# Patient Record
Sex: Female | Born: 1950 | Race: White | Hispanic: No | Marital: Married | Smoking: Never smoker
Health system: Southern US, Community
[De-identification: ages and names within clinical notes are randomized; demographics above are authoritative.]

## PROBLEM LIST (undated history)

## (undated) DIAGNOSIS — I639 Cerebral infarction, unspecified: Secondary | ICD-10-CM

## (undated) DIAGNOSIS — T7840XA Allergy, unspecified, initial encounter: Secondary | ICD-10-CM

## (undated) DIAGNOSIS — G5600 Carpal tunnel syndrome, unspecified upper limb: Secondary | ICD-10-CM

## (undated) DIAGNOSIS — E785 Hyperlipidemia, unspecified: Secondary | ICD-10-CM

## (undated) DIAGNOSIS — I499 Cardiac arrhythmia, unspecified: Secondary | ICD-10-CM

## (undated) DIAGNOSIS — M1612 Unilateral primary osteoarthritis, left hip: Secondary | ICD-10-CM

## (undated) DIAGNOSIS — F32A Depression, unspecified: Secondary | ICD-10-CM

## (undated) DIAGNOSIS — G4730 Sleep apnea, unspecified: Secondary | ICD-10-CM

## (undated) HISTORY — DX: Cardiac arrhythmia, unspecified: I49.9

## (undated) HISTORY — PX: CHOLECYSTECTOMY: SHX55

## (undated) HISTORY — DX: Carpal tunnel syndrome, unspecified upper limb: G56.00

## (undated) HISTORY — DX: Unilateral primary osteoarthritis, left hip: M16.12

## (undated) HISTORY — PX: EYELID LACERATION REPAIR: SHX1564

## (undated) HISTORY — PX: BUNIONECTOMY: SHX129

## (undated) HISTORY — DX: Hyperlipidemia, unspecified: E78.5

## (undated) HISTORY — DX: Allergy, unspecified, initial encounter: T78.40XA

---

## 2009-04-23 ENCOUNTER — Emergency Department: Payer: Self-pay | Admitting: Internal Medicine

## 2011-05-22 LAB — HM PAP SMEAR

## 2012-08-20 ENCOUNTER — Ambulatory Visit: Payer: Self-pay

## 2012-11-03 ENCOUNTER — Ambulatory Visit: Payer: Self-pay | Admitting: Gastroenterology

## 2012-11-03 LAB — HM COLONOSCOPY

## 2012-11-04 LAB — PATHOLOGY REPORT

## 2014-03-03 ENCOUNTER — Ambulatory Visit: Payer: Self-pay

## 2014-03-03 LAB — HM MAMMOGRAPHY

## 2014-08-30 ENCOUNTER — Ambulatory Visit: Payer: Self-pay

## 2014-08-30 LAB — HM DEXA SCAN

## 2015-03-16 ENCOUNTER — Telehealth: Payer: Self-pay | Admitting: Unknown Physician Specialty

## 2015-03-16 MED ORDER — FENOFIBRATE 145 MG PO TABS: 145.0000 mg | ORAL_TABLET | Freq: Every day | ORAL | Status: DC

## 2015-03-16 NOTE — Telephone Encounter (Signed)
Routing to provider. Practice partner number is [phone].

## 2015-03-16 NOTE — Telephone Encounter (Signed)
E-Fax came through for refill: Rx: Fenofibric Rx copy in basket

## 2015-07-15 DIAGNOSIS — J309 Allergic rhinitis, unspecified: Secondary | ICD-10-CM | POA: Insufficient documentation

## 2015-07-15 DIAGNOSIS — G5600 Carpal tunnel syndrome, unspecified upper limb: Secondary | ICD-10-CM

## 2015-07-15 DIAGNOSIS — E785 Hyperlipidemia, unspecified: Secondary | ICD-10-CM | POA: Insufficient documentation

## 2015-07-22 ENCOUNTER — Ambulatory Visit (INDEPENDENT_AMBULATORY_CARE_PROVIDER_SITE_OTHER): Payer: Medicare Other | Admitting: Unknown Physician Specialty

## 2015-07-22 ENCOUNTER — Encounter: Payer: Self-pay | Admitting: Unknown Physician Specialty

## 2015-07-22 VITALS — BP 125/81 | HR 61 | Temp 97.4°F | Ht 61.1 in | Wt 181.4 lb

## 2015-07-22 DIAGNOSIS — E785 Hyperlipidemia, unspecified: Secondary | ICD-10-CM | POA: Diagnosis not present

## 2015-07-22 DIAGNOSIS — Z5181 Encounter for therapeutic drug level monitoring: Secondary | ICD-10-CM | POA: Diagnosis not present

## 2015-07-22 DIAGNOSIS — Z0000 Encounter for general adult medical examination without abnormal findings: Secondary | ICD-10-CM | POA: Diagnosis not present

## 2015-07-22 DIAGNOSIS — M25561 Pain in right knee: Secondary | ICD-10-CM | POA: Diagnosis not present

## 2015-07-22 DIAGNOSIS — G5600 Carpal tunnel syndrome, unspecified upper limb: Secondary | ICD-10-CM

## 2015-07-22 DIAGNOSIS — L309 Dermatitis, unspecified: Secondary | ICD-10-CM | POA: Diagnosis not present

## 2015-07-22 NOTE — Assessment & Plan Note (Signed)
Check lipid panel  

## 2015-07-22 NOTE — Progress Notes (Signed)
Patient: Bethany Lara, Female    DOB: 12/11/1950, 64 y.o.   MRN: [id number] Visit Date: 07/22/2015  Today's Provider: Kathrine Haddock, NP   Chief Complaint  Patient presents with  . Medicare Wellness    pt states she is having a problem with her skin   Subjective:    Annual wellness visit Bethany Lara is a 64 y.o. female who presents today for her Subsequent Annual Wellness Visit. She feels well. She reports exercising . She reports she is sleeping poorly due to carpal tunnel  ----------------------------------------------------------- Hyperlipidemia This is a chronic problem. The problem is controlled. Recent lipid tests were reviewed and are normal. Pertinent negatives include no chest pain, focal sensory loss, focal weakness, leg pain, myalgias or shortness of breath. Current antihyperlipidemic treatment includes statins and fibric acid derivatives. There are no compliance problems.     Review of Systems  Constitutional: Negative.   HENT: Negative.   Eyes: Negative.   Respiratory: Negative.  Negative for shortness of breath.   Cardiovascular: Negative for chest pain.  Endocrine: Negative.   Genitourinary: Negative.   Musculoskeletal: Negative for myalgias.       Not sleeping well due to carpal tunnel.  Appointment made with Dr. Manuella Ghazi.  Fingers are numb  Did something to knee 2 weeks while squatting.    Skin:       Extensive rash which was excema managed by dermatology.  Nurse thought she should be evaluated for celiac  Allergic/Immunologic: Negative.   Neurological: Negative.  Negative for focal weakness.  Hematological: Negative.   Psychiatric/Behavioral: Negative.     Social History   Social History  . Marital Status: Married    Spouse Name: N/A  . Number of Children: N/A  . Years of Education: N/A   Occupational History  . Not on file.   Social History Main Topics  . Smoking status: Never Smoker   . Smokeless tobacco: Never Used  . Alcohol Use: No   . Drug Use: No  . Sexual Activity: Yes   Other Topics Concern  . Not on file   Social History Narrative    Patient Active Problem List   Diagnosis Date Noted  . Eczema 07/22/2015  . Hyperlipidemia 07/15/2015  . Allergic rhinitis 07/15/2015  . Carpal tunnel syndrome 07/15/2015    Past Surgical History  Procedure Laterality Date  . Cholecystectomy    . Bunionectomy      Her family history includes Alzheimer's disease in her father; Cancer in her father; Dementia in her mother; Diabetes in her father and mother; Glaucoma in her father; Heart disease in her father; Hypertension in her mother.    Previous Medications   ASPIRIN 81 MG TABLET    Take 81 mg by mouth daily.   FENOFIBRATE (TRICOR) 145 MG TABLET    Take 1 tablet (145 mg total) by mouth daily.   PRAVASTATIN (PRAVACHOL) 40 MG TABLET    Take 40 mg by mouth daily.    Patient Care Team: Kathrine Haddock, NP as PCP - General (Nurse Practitioner) Kathrine Haddock, NP as PCP - Family Medicine (Nurse Practitioner)     Objective:   Vitals: BP 125/81 mmHg  Pulse 61  Temp(Src) 97.4 F (36.3 C)  Ht 5' 1.1" (1.552 m)  Wt 181 lb 6.4 oz (82.283 kg)  BMI 34.16 kg/m2  SpO2 98%  LMP  (LMP Unknown)  Physical Exam  Constitutional: She is oriented to person, place, and time. She appears well-developed and well-nourished. No distress.  HENT:  Head: Normocephalic and atraumatic.  Eyes: Conjunctivae and lids are normal. Right eye exhibits no discharge. Left eye exhibits no discharge. No scleral icterus.  Cardiovascular: Normal rate, regular rhythm and normal heart sounds.   Pulmonary/Chest: Effort normal and breath sounds normal. No respiratory distress.  Abdominal: Normal appearance and bowel sounds are normal. She exhibits no distension. There is no splenomegaly or hepatomegaly. There is no tenderness.  Genitourinary: No breast swelling, tenderness or discharge.  Musculoskeletal: Normal range of motion.       Right knee: She  exhibits swelling.  Neurological: She is alert and oriented to person, place, and time.  Skin: Skin is intact. Rash noted. No pallor.  Extensive rash  Psychiatric: She has a normal mood and affect. Her behavior is normal. Judgment and thought content normal.  Vitals reviewed.   Activities of Daily Living In your present state of health, do you have any difficulty performing the following activities: 07/22/2015  Hearing? N  Vision? N  Difficulty concentrating or making decisions? N  Walking or climbing stairs? N  Dressing or bathing? N  Doing errands, shopping? N    Fall Risk Assessment Fall Risk  07/22/2015 07/22/2015  Falls in the past year? No No     Patient reports there are safety devices in place in shower at home.   Depression Screen PHQ 2/9 Scores 07/22/2015  PHQ - 2 Score 0    Cognitive Testing - 6-CIT   Correct? Score   What year is it? yes 0 Yes = 0    No = 4  What month is it? yes 0 Yes = 0    No = 3  Remember:     [address]ia [address], [place], [place]     What time is it? yes 0 Yes = 0    No = 3  Count backwards from 20 to 1 yes 0 Correct = 0    1 error = 2   More than 1 error = 4  Say the months of the year in reverse. yes 0 Correct = 0    1 error = 2   More than 1 error = 4  What address did I ask you to remember? yes 0 Correct = 0  1 error = 2    2 error = 4    3 error = 6    4 error = 8    All wrong = 10       TOTAL SCORE  0/28   Interpretation:  Normal  Normal (0-7) Abnormal (8-28)        Assessment & Plan:     Annual Wellness Visit  Reviewed patient's Family Medical History Reviewed and updated list of patient's medical providers Assessment of cognitive impairment was done Assessed patient's functional ability Established a written schedule for health screening [place] Completed and Reviewed  Exercise Activities and Dietary recommendations Goals    None      Immunization History  Administered Date(s)  Administered  . Pneumococcal Conjugate-13 07/13/2014  . Td 11/20/2005  . Zoster 05/09/2010    Health Maintenance  Topic Date Due  . Hepatitis C Screening  [birth year]  . PNA vac Low Risk Adult (2 of 2 - PPSV23) 07/14/2015  . INFLUENZA VACCINE  12/23/2015 (Originally 04/25/2015)  . COLONOSCOPY  11/04/2015  . TETANUS/TDAP  11/21/2015  . MAMMOGRAM  03/03/2016  . DEXA SCAN  Completed  . ZOSTAVAX  Completed    Discussed health  benefits of physical activity, and encouraged her to engage in regular exercise appropriate for her age and condition.    Problem List Items Addressed This Visit      Unprioritized   Hyperlipidemia    Check lipid panel       Relevant Orders   Comprehensive metabolic panel   Lipid Panel w/o Chol/HDL Ratio   Carpal tunnel syndrome - Primary    Planning on seeing Dr. Manuella Ghazi      Eczema    Followed by dermatology and r/o celiac      Relevant Orders   Tissue transglutaminase, IgA    Other Visit Diagnoses    Routine general medical examination at a health care facility        Relevant Orders    Hepatitis C antibody    Medication monitoring encounter        Relevant Orders    Comprehensive metabolic panel    Knee pain, acute, right        Suspect strain.  She will wear brace       ------------------------------------------------------------------------------------------------------------    There are no diagnoses linked to this encounter.    07/22/2015

## 2015-07-22 NOTE — Assessment & Plan Note (Signed)
Followed by dermatology and r/o celiac

## 2015-07-22 NOTE — Assessment & Plan Note (Signed)
Planning on seeing Dr. Manuella Ghazi

## 2015-07-23 LAB — COMPREHENSIVE METABOLIC PANEL
ALK PHOS: 66 IU/L (ref 39–117)
ALT: 15 IU/L (ref 0–32)
AST: 17 IU/L (ref 0–40)
Albumin/Globulin Ratio: 1.8 (ref 1.1–2.5)
Albumin: 4.2 g/dL (ref 3.6–4.8)
BUN/Creatinine Ratio: 14 (ref 11–26)
BUN: 12 mg/dL (ref 8–27)
Bilirubin Total: 0.5 mg/dL (ref 0.0–1.2)
CALCIUM: 9.1 mg/dL (ref 8.7–10.3)
CO2: 21 mmol/L (ref 18–29)
Chloride: 105 mmol/L (ref 97–106)
Creatinine, Ser: 0.86 mg/dL (ref 0.57–1.00)
GFR calc Af Amer: 81 mL/min/{1.73_m2} (ref >59)
GFR calc non Af Amer: 71 mL/min/{1.73_m2} (ref >59)
GLOBULIN, TOTAL: 2.4 g/dL (ref 1.5–4.5)
Glucose: 106 mg/dL — ABNORMAL HIGH (ref 65–99)
POTASSIUM: 4.2 mmol/L (ref 3.5–5.2)
SODIUM: 142 mmol/L (ref 136–144)
Total Protein: 6.6 g/dL (ref 6.0–8.5)

## 2015-07-23 LAB — LIPID PANEL W/O CHOL/HDL RATIO
CHOLESTEROL TOTAL: 183 mg/dL (ref 100–199)
HDL: 55 mg/dL (ref >39)
LDL Calculated: 101 mg/dL — ABNORMAL HIGH (ref 0–99)
TRIGLYCERIDES: 134 mg/dL (ref 0–149)
VLDL Cholesterol Cal: 27 mg/dL (ref 5–40)

## 2015-07-23 LAB — HEPATITIS C ANTIBODY: Hep C Virus Ab: <0.1 s/co ratio (ref 0.0–0.9)

## 2015-07-25 ENCOUNTER — Encounter: Payer: Self-pay | Admitting: Unknown Physician Specialty

## 2015-07-25 LAB — TISSUE TRANSGLUTAMINASE, IGA

## 2015-08-02 ENCOUNTER — Other Ambulatory Visit: Payer: Self-pay | Admitting: Unknown Physician Specialty

## 2015-08-10 DIAGNOSIS — E669 Obesity, unspecified: Secondary | ICD-10-CM | POA: Insufficient documentation

## 2015-09-10 ENCOUNTER — Other Ambulatory Visit: Payer: Self-pay | Admitting: Unknown Physician Specialty

## 2015-10-29 ENCOUNTER — Other Ambulatory Visit: Payer: Self-pay | Admitting: Unknown Physician Specialty

## 2015-11-14 HISTORY — PX: CARPAL TUNNEL RELEASE: SHX101

## 2015-11-28 ENCOUNTER — Ambulatory Visit (INDEPENDENT_AMBULATORY_CARE_PROVIDER_SITE_OTHER): Payer: Medicare Other

## 2015-11-28 DIAGNOSIS — Z23 Encounter for immunization: Secondary | ICD-10-CM

## 2015-12-20 ENCOUNTER — Telehealth: Payer: Self-pay | Admitting: Unknown Physician Specialty

## 2015-12-20 MED ORDER — FENOFIBRATE 160 MG PO TABS: 160.0000 mg | ORAL_TABLET | Freq: Every day | ORAL | Status: DC

## 2015-12-20 NOTE — Telephone Encounter (Signed)
Called and let patient know about medication.  

## 2015-12-20 NOTE — Telephone Encounter (Signed)
Changed to Fenobibrate 160

## 2015-12-20 NOTE — Telephone Encounter (Signed)
Routing to provider  

## 2015-12-20 NOTE — Telephone Encounter (Signed)
Pt came in stated she needs her RX for Fenofibrate changed to one of the following due to insurance changes and cost:  Fenofibrate tab 160mg  Gemfibrozil  Pharm is [facility] in [place]. Thanks.  Pt is completely out of this medication.

## 2016-01-20 ENCOUNTER — Encounter: Payer: Self-pay | Admitting: Unknown Physician Specialty

## 2016-01-20 ENCOUNTER — Ambulatory Visit (INDEPENDENT_AMBULATORY_CARE_PROVIDER_SITE_OTHER): Payer: Medicare Other | Admitting: Unknown Physician Specialty

## 2016-01-20 VITALS — BP 118/71 | HR 60 | Temp 97.4°F | Ht 60.7 in | Wt 179.8 lb

## 2016-01-20 DIAGNOSIS — E785 Hyperlipidemia, unspecified: Secondary | ICD-10-CM

## 2016-01-20 DIAGNOSIS — E8881 Metabolic syndrome: Secondary | ICD-10-CM

## 2016-01-20 DIAGNOSIS — Z23 Encounter for immunization: Secondary | ICD-10-CM

## 2016-01-20 DIAGNOSIS — R7301 Impaired fasting glucose: Secondary | ICD-10-CM | POA: Insufficient documentation

## 2016-01-20 DIAGNOSIS — L309 Dermatitis, unspecified: Secondary | ICD-10-CM | POA: Diagnosis not present

## 2016-01-20 LAB — LIPID PANEL PICCOLO, WAIVED
Chol/HDL Ratio Piccolo,Waive: 3.9 mg/dL
Cholesterol Piccolo, Waived: 150 mg/dL (ref <200)
HDL Chol Piccolo, Waived: 39 mg/dL — ABNORMAL LOW (ref >59)
LDL Chol Calc Piccolo Waived: 73 mg/dL (ref <100)
TRIGLYCERIDES PICCOLO,WAIVED: 194 mg/dL — AB (ref <150)
VLDL Chol Calc Piccolo,Waive: 39 mg/dL — ABNORMAL HIGH (ref <30)

## 2016-01-20 LAB — BAYER DCA HB A1C WAIVED: HB A1C: 5.8 % (ref <7.0)

## 2016-01-20 NOTE — Assessment & Plan Note (Addendum)
Patient reports improvement with topical cream prescribed by Dermatology.

## 2016-01-20 NOTE — Progress Notes (Signed)
BP 118/71 mmHg  Pulse 60  Temp(Src) 97.4 F (36.3 C)  Ht 5' 0.7" (1.542 m)  Wt 179 lb 12.8 oz (81.557 kg)  BMI 34.30 kg/m2  SpO2 96%  LMP  (LMP Unknown)   Subjective:    Patient ID: Bethany Lara, female    DOB: May 25, 1951, 65 y.o.   MRN: [id number]  HPI: Bethany Lara is a 65 y.o. female  Chief Complaint  Patient presents with  . Hyperlipidemia   Pt presents in clinic for 6 month f/u for hyperlipidemia. Patient reports doing well. Patient states that she has had difficulty with her Eczema, with increased itching and skin irritation through the night. Patient was recently seen, yesterday, by Dermatology for her eczema. Patient was prescribed Prednisone,Triamethicilone, and Benadryl that provided relief and she was able to sleep through the night.   Hyperlipidemia Using medications without problems. No Muscle aches  Diet compliance: maintains heart healthy diet and reports no changes in diet.  Exercise: patient denies an exercise regimen. Patient states that she cares for her mother and grandchildren through most most of day.   Rash: Patient noted increased erythema, rash, and swelling on bilateral forearms. Onset last week.  Patient states that Dermatology prescribed prednisone oral taper for 6 days. Patient has not stated taken the steroid; plans to start taking the medication today.    Relevant past medical, surgical, family and social history reviewed and updated as indicated. Interim medical history since our last visit reviewed. Allergies and medications reviewed and updated.  Review of Systems  Constitutional: Negative.   Respiratory: Negative.  Negative for chest tightness and shortness of breath.   Cardiovascular: Negative.  Negative for chest pain.  Gastrointestinal: Negative.   Musculoskeletal: Negative.  Negative for myalgias.  Skin: Positive for rash.  Neurological: Negative.  Negative for dizziness, weakness and light-headedness.   Psychiatric/Behavioral: Negative.   All other systems reviewed and are negative.   Per HPI unless specifically indicated above     Objective:    BP 118/71 mmHg  Pulse 60  Temp(Src) 97.4 F (36.3 C)  Ht 5' 0.7" (1.542 m)  Wt 179 lb 12.8 oz (81.557 kg)  BMI 34.30 kg/m2  SpO2 96%  LMP  (LMP Unknown)  Wt Readings from Last 3 Encounters:  01/20/16 179 lb 12.8 oz (81.557 kg)  07/22/15 181 lb 6.4 oz (82.283 kg)  01/14/15 182 lb (82.555 kg)    Physical Exam  Constitutional: She is oriented to person, place, and time. She appears well-developed and well-nourished. No distress.  HENT:  Head: Normocephalic and atraumatic.  Eyes: Conjunctivae and EOM are normal. Pupils are equal, round, and reactive to light.  Neck: Normal range of motion.  Cardiovascular: Normal rate, regular rhythm, normal heart sounds and intact distal pulses.   Pulmonary/Chest: Effort normal and breath sounds normal. No respiratory distress. She exhibits no tenderness.  Abdominal: Soft. She exhibits no distension. There is no tenderness.  Musculoskeletal: Normal range of motion. She exhibits no edema or tenderness.  Neurological: She is alert and oriented to person, place, and time.  Skin: Skin is warm and dry. Rash noted. There is erythema.  Psychiatric: She has a normal mood and affect. Her behavior is normal. Judgment and thought content normal.  Nursing note and vitals reviewed.   Results for orders placed or performed in visit on 07/22/15  Comprehensive metabolic panel  Result Value Ref Range   Glucose 106 (H) 65 - 99 mg/dL   BUN 12 8 - 27  mg/dL   Creatinine, Ser 0.86 0.57 - 1.00 mg/dL   GFR calc non Af Amer 71 >59 mL/min/1.73   GFR calc Af Amer 81 >59 mL/min/1.73   BUN/Creatinine Ratio 14 11 - 26   Sodium 142 136 - 144 mmol/L   Potassium 4.2 3.5 - 5.2 mmol/L   Chloride 105 97 - 106 mmol/L   CO2 21 18 - 29 mmol/L   Calcium 9.1 8.7 - 10.3 mg/dL   Total Protein 6.6 6.0 - 8.5 g/dL   Albumin 4.2 3.6 -  4.8 g/dL   Globulin, Total 2.4 1.5 - 4.5 g/dL   Albumin/Globulin Ratio 1.8 1.1 - 2.5   Bilirubin Total 0.5 0.0 - 1.2 mg/dL   Alkaline Phosphatase 66 39 - 117 IU/L   AST 17 0 - 40 IU/L   ALT 15 0 - 32 IU/L  Lipid Panel w/o Chol/HDL Ratio  Result Value Ref Range   Cholesterol, Total 183 100 - 199 mg/dL   Triglycerides 134 0 - 149 mg/dL   HDL 55 >39 mg/dL   VLDL Cholesterol Cal 27 5 - 40 mg/dL   LDL Calculated 101 (H) 0 - 99 mg/dL  Tissue transglutaminase, IgA  Result Value Ref Range   Transglutaminase IgA <2 0 - 3 U/mL  Hepatitis C antibody  Result Value Ref Range   Hep C Virus Ab <0.1 0.0 - 0.9 s/co ratio      Assessment & Plan:   Problem List Items Addressed This Visit      Unprioritized   Eczema    Patient reports improvement with topical cream prescribed by Dermatology.       Hyperlipidemia - Primary    Lipid panel done and discussed values with patient. Will continue medication regimen and follow-up in 6 months.       Relevant Orders   Bayer DCA Hb A1c Waived   Lipid Panel Piccolo, Waived   Comprehensive metabolic panel   IFG (impaired fasting glucose)    hgb A1C is 5.8.  Discussed diet and exercise      Metabolic syndrome    Discussed with the patient the criteria for metabolic syndrome. Patient elected to start lifestyle changes with 20 minutes walks x 4 days/week, decreased bread intake and smaller portioned meals.        Other Visit Diagnoses    Need for pneumococcal vaccination        Relevant Orders    Pneumococcal polysaccharide vaccine 23-valent greater than or equal to 2yo subcutaneous/IM (Completed)        Follow up plan: Return in about 6 months (around 07/21/2016) for follow-up .

## 2016-01-20 NOTE — Assessment & Plan Note (Signed)
hgb A1C is 5.8.  Discussed diet and exercise

## 2016-01-20 NOTE — Patient Instructions (Addendum)
Diet for Metabolic Syndrome Metabolic syndrome is a disorder that includes at least three of these conditions:  Abdominal obesity.  Too much sugar in your blood.  High blood pressure.  Higher than normal amount of fat (lipids) in your blood.  Lower than normal level of "good" cholesterol (HDL). Following a healthy diet can help to keep metabolic syndrome under control. It can also help to prevent the development of conditions that are associated with metabolic syndrome, such as diabetes, heart disease, and stroke. Along with exercise, a healthy diet:  Helps to improve the way that the body uses insulin.  Promotes weight loss. A common goal for people with this condition is to lose at least 7 to 10 percent of their starting weight. WHAT DO I NEED TO KNOW ABOUT THIS DIET?  Use the glycemic index (GI) to plan your meals. The index tells you how quickly a food will raise your blood sugar. Choose foods that have low GI values. These foods take a longer time to raise blood sugar.  Keep track of how many calories you take in. Eating the right amount of calories will help your achieve a healthy weight.  You may want to follow a Mediterranean diet. This diet includes lots of vegetables, lean meats or fish, whole grains, fruits, and healthy oils and fats. WHAT FOODS CAN I EAT? Grains Stone-ground whole wheat. Pumpernickel bread. Whole-grain bread, crackers, tortillas, cereal, and pasta. Unsweetened oatmeal.Bulgur.Barley.Quinoa.Brown rice or wild rice. Vegetables Lettuce. Spinach. Peas. Beets. Cauliflower. Cabbage. Broccoli. Carrots. Tomatoes. Squash. Eggplant. Herbs. Peppers. Onions. Cucumbers. Brussels sprouts. Sweet potatoes. Yams. Beans. Lentils. Fruits Berries. Apples. Oranges. Grapes. Mango. Pomegranate. Kiwi. Cherries. Meats and Other Protein Sources Seafood and shellfish. Lean meats.Poultry. Tofu. Dairy Low-fat or fat-free dairy products, such as milk, yogurt, and  cheese. Beverages Water. Low-fat milk. Milk alternatives, like soy milk or almond milk. Real fruit juice. Condiments Low-sugar or sugar-free ketchup, barbecue sauce, and mayonnaise. Mustard. Relish. Fats and Oils Avocado. Canola or olive oil. Nuts and nut butters.Seeds. The items listed above may not be a complete list of recommended foods or beverages. Contact your dietitian for more options.  WHAT FOODS ARE NOT RECOMMENDED? Red meat. Palm oil and coconut oil. Processed foods. Fried foods. Alcohol. Sweetened drinks, such as iced tea and soda. Sweets. Salty foods. The items listed above may not be a complete list of foods and beverages to avoid. Contact your dietitian for more information.   This information is not intended to replace advice given to you by your health care provider. Make sure you discuss any questions you have with your health care provider.   Document Released: 01/25/2015 Document Reviewed: 01/25/2015 Elsevier Interactive Patient Education 2016 [employer]  

## 2016-01-20 NOTE — Assessment & Plan Note (Signed)
Lipid panel done and discussed values with patient. Will continue medication regimen and follow-up in 6 months.

## 2016-01-20 NOTE — Assessment & Plan Note (Signed)
Discussed with the patient the criteria for metabolic syndrome. Patient elected to start lifestyle changes with 20 minutes walks x 4 days/week, decreased bread intake and smaller portioned meals.

## 2016-01-21 LAB — COMPREHENSIVE METABOLIC PANEL
ALBUMIN: 4.2 g/dL (ref 3.6–4.8)
ALK PHOS: 66 IU/L (ref 39–117)
ALT: 19 IU/L (ref 0–32)
AST: 19 IU/L (ref 0–40)
Albumin/Globulin Ratio: 1.7 (ref 1.2–2.2)
BUN / CREAT RATIO: 14 (ref 12–28)
BUN: 14 mg/dL (ref 8–27)
Bilirubin Total: 0.4 mg/dL (ref 0.0–1.2)
CALCIUM: 9.2 mg/dL (ref 8.7–10.3)
CO2: 23 mmol/L (ref 18–29)
CREATININE: 0.98 mg/dL (ref 0.57–1.00)
Chloride: 102 mmol/L (ref 96–106)
GFR calc Af Amer: 70 mL/min/{1.73_m2} (ref >59)
GFR, EST NON AFRICAN AMERICAN: 60 mL/min/{1.73_m2} (ref >59)
GLOBULIN, TOTAL: 2.5 g/dL (ref 1.5–4.5)
GLUCOSE: 104 mg/dL — AB (ref 65–99)
Potassium: 4.3 mmol/L (ref 3.5–5.2)
SODIUM: 141 mmol/L (ref 134–144)
TOTAL PROTEIN: 6.7 g/dL (ref 6.0–8.5)

## 2016-04-16 ENCOUNTER — Other Ambulatory Visit: Payer: Self-pay | Admitting: Unknown Physician Specialty

## 2016-06-20 ENCOUNTER — Ambulatory Visit (INDEPENDENT_AMBULATORY_CARE_PROVIDER_SITE_OTHER): Payer: Medicare Other | Admitting: Family Medicine

## 2016-06-20 ENCOUNTER — Encounter: Payer: Self-pay | Admitting: Family Medicine

## 2016-06-20 VITALS — BP 118/74 | HR 84 | Temp 98.1°F | Wt 172.0 lb

## 2016-06-20 DIAGNOSIS — L509 Urticaria, unspecified: Secondary | ICD-10-CM | POA: Diagnosis not present

## 2016-06-20 MED ORDER — HYDROXYZINE PAMOATE 50 MG PO CAPS: 50.0000 mg | ORAL_CAPSULE | Freq: Three times a day (TID) | ORAL | 2 refills | Status: DC | PRN

## 2016-06-20 MED ORDER — CETIRIZINE HCL 10 MG PO TABS: 10.0000 mg | ORAL_TABLET | Freq: Every day | ORAL | 11 refills | Status: DC

## 2016-06-20 MED ORDER — CLOBETASOL PROPIONATE 0.05 % EX CREA: 1.0000 "application " | TOPICAL_CREAM | Freq: Two times a day (BID) | CUTANEOUS | 0 refills | Status: DC

## 2016-06-20 NOTE — Progress Notes (Signed)
   BP 118/74   Pulse 84   Temp 98.1 F (36.7 C)   Wt 172 lb (78 kg)   LMP  (LMP Unknown)   SpO2 98%   BMI 32.82 kg/m    Subjective:    Patient ID: Bethany Lara, female    DOB: 22-Dec-1950, 65 y.o.   MRN: [id number]  HPI: Bethany Lara is a 65 y.o. female  Chief Complaint  Patient presents with  . Rash    has been breaking out in hives for the last month, seems worse today. Does itch alot. Breaks out on her face, lips, back, hips, chest, arms. Under alot of stress.   Patient presents with 3 week history of persistent hives sporadic across body. C/o severe itching with these areas. Taking benadryl with some relief. Is under a lot of stress right now and feels like that is triggering this episode. Denies any new soaps, lotions, foods, medicines. Denies, fever, chills, fatigue, CP, SOB.   Relevant past medical, surgical, family and social history reviewed and updated as indicated. Interim medical history since our last visit reviewed. Allergies and medications reviewed and updated.  Review of Systems  Constitutional: Negative.   HENT: Negative.   Respiratory: Negative.   Cardiovascular: Negative.   Gastrointestinal: Negative.   Genitourinary: Negative.   Musculoskeletal: Negative.   Skin: Negative.   Neurological: Negative.   Psychiatric/Behavioral: Negative.     Per HPI unless specifically indicated above     Objective:    BP 118/74   Pulse 84   Temp 98.1 F (36.7 C)   Wt 172 lb (78 kg)   LMP  (LMP Unknown)   SpO2 98%   BMI 32.82 kg/m   Wt Readings from Last 3 Encounters:  06/20/16 172 lb (78 kg)  01/20/16 179 lb 12.8 oz (81.6 kg)  07/22/15 181 lb 6.4 oz (82.3 kg)    Physical Exam  Constitutional: She is oriented to person, place, and time. She appears well-developed and well-nourished. No distress.  HENT:  Head: Atraumatic.  Eyes: Conjunctivae are normal. No scleral icterus.  Neck: Normal range of motion. Neck supple.  Cardiovascular: Normal  rate and normal heart sounds.   Pulmonary/Chest: Effort normal and breath sounds normal. No respiratory distress.  Musculoskeletal: Normal range of motion.  Neurological: She is alert and oriented to person, place, and time.  Skin: Skin is warm and dry.  Multiple large, erythematous maculopapular areas especially across chest with some sporadic across rest of body.   Psychiatric: She has a normal mood and affect. Her behavior is normal.  Nursing note and vitals reviewed.     Assessment & Plan:   Problem List Items Addressed This Visit    None    Visit Diagnoses    Urticaria    -  Primary    Zyrtec and hydroxyzine sent, discussed potential sedation risks with hydroxyzine and patient knows not to drive on medication. Clobetasol cream sent for use on affected areas for 1 week. After this time, she knows to switch to the triamcinolone she has at home. She will follow up if no improvement in the next 4-5 days at which time we will consider oral prednisone. Pt hoping to avoid oral prednisone  Follow up plan: Return if symptoms worsen or fail to improve.

## 2016-06-20 NOTE — Patient Instructions (Signed)
Follow up as needed

## 2016-06-25 NOTE — Progress Notes (Signed)
May want to consider Allegra or Claritin also

## 2016-07-23 ENCOUNTER — Encounter: Payer: Self-pay | Admitting: Unknown Physician Specialty

## 2016-07-23 ENCOUNTER — Ambulatory Visit (INDEPENDENT_AMBULATORY_CARE_PROVIDER_SITE_OTHER): Payer: Medicare Other | Admitting: Unknown Physician Specialty

## 2016-07-23 VITALS — BP 130/77 | HR 62 | Temp 97.6°F | Ht 61.6 in | Wt 172.4 lb

## 2016-07-23 DIAGNOSIS — Z5181 Encounter for therapeutic drug level monitoring: Secondary | ICD-10-CM

## 2016-07-23 DIAGNOSIS — E784 Other hyperlipidemia: Secondary | ICD-10-CM | POA: Diagnosis not present

## 2016-07-23 DIAGNOSIS — Z1231 Encounter for screening mammogram for malignant neoplasm of breast: Secondary | ICD-10-CM | POA: Diagnosis not present

## 2016-07-23 DIAGNOSIS — Z0000 Encounter for general adult medical examination without abnormal findings: Secondary | ICD-10-CM

## 2016-07-23 DIAGNOSIS — Z23 Encounter for immunization: Secondary | ICD-10-CM

## 2016-07-23 DIAGNOSIS — E7849 Other hyperlipidemia: Secondary | ICD-10-CM

## 2016-07-23 MED ORDER — PRAVASTATIN SODIUM 40 MG PO TABS: 40.0000 mg | ORAL_TABLET | Freq: Every day | ORAL | 3 refills | Status: DC

## 2016-07-23 NOTE — Patient Instructions (Addendum)

## 2016-07-23 NOTE — Progress Notes (Signed)
BP 130/77 (BP Location: Left Arm, Patient Position: Sitting, Cuff Size: Large)   Pulse 62   Temp 97.6 F (36.4 C)   Ht 5' 1.6" (1.565 m)   Wt 172 lb 6.4 oz (78.2 kg)   LMP  (LMP Unknown)   SpO2 98%   BMI 31.94 kg/m    Subjective:    Patient ID: Bethany Lara, female    DOB: 1951/06/04, 65 y.o.   MRN: [id number]  HPI: Bethany Lara is a 65 y.o. female  Chief Complaint  Patient presents with  . Medicare Wellness   Functional Status Survey: Is the patient deaf or have difficulty hearing?: No Does the patient have difficulty seeing, even when wearing glasses/contacts?: No Does the patient have difficulty concentrating, remembering, or making decisions?: Yes Does the patient have difficulty walking or climbing stairs?: No Does the patient have difficulty dressing or bathing?: No Does the patient have difficulty doing errands alone such as visiting a doctor's office or shopping?: No  Fall Risk  07/23/2016 07/22/2015 07/22/2015  Falls in the past year? No No No   Depression screen [facility] 2/9 07/23/2016 07/22/2015  Decreased Interest 0 0  Down, Depressed, Hopeless 2 0  PHQ - 2 Score 2 0   Social History   Social History  . Marital status: Married    Spouse name: N/A  . Number of children: N/A  . Years of education: N/A   Occupational History  . Not on file.   Social History Main Topics  . Smoking status: Never Smoker  . Smokeless tobacco: Never Used  . Alcohol use No  . Drug use: No  . Sexual activity: Yes   Other Topics Concern  . Not on file   Social History Narrative  . No narrative on file   Family History  Problem Relation Age of Onset  . Diabetes Mother   . Hypertension Mother   . Dementia Mother   . Cancer Father     colon  . Diabetes Father   . Heart disease Father   . Alzheimer's disease Father   . Glaucoma Father    Past Medical History:  Diagnosis Date  . Allergy   . Carpal tunnel syndrome   . Hyperlipidemia    Past Surgical  History:  Procedure Laterality Date  . BUNIONECTOMY    . CARPAL TUNNEL RELEASE  11/14/2015  . CHOLECYSTECTOMY     Hyperlipidemia Using medications without problems: No Muscle aches  Diet compliance: Watches diet "somewhat." Exercise: none  Allergies Stable  Relevant past medical, surgical, family and social history reviewed and updated as indicated. Interim medical history since our last visit reviewed. Allergies and medications reviewed and updated.  Review of Systems  All other systems reviewed and are negative.   Per HPI unless specifically indicated above     Objective:    BP 130/77 (BP Location: Left Arm, Patient Position: Sitting, Cuff Size: Large)   Pulse 62   Temp 97.6 F (36.4 C)   Ht 5' 1.6" (1.565 m)   Wt 172 lb 6.4 oz (78.2 kg)   LMP  (LMP Unknown)   SpO2 98%   BMI 31.94 kg/m   Wt Readings from Last 3 Encounters:  07/23/16 172 lb 6.4 oz (78.2 kg)  06/20/16 172 lb (78 kg)  01/20/16 179 lb 12.8 oz (81.6 kg)    Physical Exam  Constitutional: She is oriented to person, place, and time. She appears well-developed and well-nourished.  HENT:  Head: Normocephalic and  atraumatic.  Eyes: Pupils are equal, round, and reactive to light. Right eye exhibits no discharge. Left eye exhibits no discharge. No scleral icterus.  Neck: Normal range of motion. Neck supple. Carotid bruit is not present. No thyromegaly present.  Cardiovascular: Normal rate, regular rhythm and normal heart sounds.  Exam reveals no gallop and no friction rub.   No murmur heard. Pulmonary/Chest: Effort normal and breath sounds normal. No respiratory distress. She has no wheezes. She has no rales.  Abdominal: Soft. Bowel sounds are normal. There is no tenderness. There is no rebound.  Genitourinary: No breast swelling, tenderness or discharge.  Musculoskeletal: Normal range of motion.  Lymphadenopathy:    She has no cervical adenopathy.  Neurological: She is alert and oriented to person,  place, and time.  Skin: Skin is warm, dry and intact. No rash noted.  Psychiatric: She has a normal mood and affect. Her speech is normal and behavior is normal. Judgment and thought content normal. Cognition and memory are normal.    Results for orders placed or performed in visit on 01/20/16  Bayer DCA Hb A1c Waived  Result Value Ref Range   Bayer DCA Hb A1c Waived 5.8 <7.0 %  Lipid Panel Piccolo, Waived  Result Value Ref Range   Cholesterol Piccolo, Waived 150 <200 mg/dL   HDL Chol Piccolo, Waived 39 (L) >59 mg/dL   Triglycerides Piccolo,Waived 194 (H) <150 mg/dL   Chol/HDL Ratio Piccolo,Waive 3.9 mg/dL   LDL Chol Calc Piccolo Waived 73 <100 mg/dL   VLDL Chol Calc Piccolo,Waive 39 (H) <30 mg/dL  Comprehensive metabolic panel  Result Value Ref Range   Glucose 104 (H) 65 - 99 mg/dL   BUN 14 8 - 27 mg/dL   Creatinine, Ser 0.98 0.57 - 1.00 mg/dL   GFR calc non Af Amer 60 >59 mL/min/1.73   GFR calc Af Amer 70 >59 mL/min/1.73   BUN/Creatinine Ratio 14 12 - 28   Sodium 141 134 - 144 mmol/L   Potassium 4.3 3.5 - 5.2 mmol/L   Chloride 102 96 - 106 mmol/L   CO2 23 18 - 29 mmol/L   Calcium 9.2 8.7 - 10.3 mg/dL   Total Protein 6.7 6.0 - 8.5 g/dL   Albumin 4.2 3.6 - 4.8 g/dL   Globulin, Total 2.5 1.5 - 4.5 g/dL   Albumin/Globulin Ratio 1.7 1.2 - 2.2   Bilirubin Total 0.4 0.0 - 1.2 mg/dL   Alkaline Phosphatase 66 39 - 117 IU/L   AST 19 0 - 40 IU/L   ALT 19 0 - 32 IU/L  +---    Assessment & Plan:   Problem List Items Addressed This Visit      Unprioritized   Hyperlipidemia    Check lipid panel      Relevant Medications   pravastatin (PRAVACHOL) 40 MG tablet   Other Relevant Orders   Lipid Panel w/o Chol/HDL Ratio    Other Visit Diagnoses    Need for influenza vaccination    -  Primary   Relevant Orders   Flu vaccine HIGH DOSE PF (Completed)   Routine general medical examination at a health care facility       Relevant Orders   Cologuard   Medication monitoring  encounter       Relevant Orders   Comprehensive metabolic panel   Encounter for screening mammogram for breast cancer       Relevant Orders   MM DIGITAL SCREENING BILATERAL       Follow up plan:  Return in about 6 months (around 01/21/2017).

## 2016-07-23 NOTE — Assessment & Plan Note (Signed)
Check lipid panel  

## 2016-07-24 LAB — COMPREHENSIVE METABOLIC PANEL
ALBUMIN: 4.2 g/dL (ref 3.6–4.8)
ALT: 15 IU/L (ref 0–32)
AST: 14 IU/L (ref 0–40)
Albumin/Globulin Ratio: 1.7 (ref 1.2–2.2)
Alkaline Phosphatase: 69 IU/L (ref 39–117)
BILIRUBIN TOTAL: 0.5 mg/dL (ref 0.0–1.2)
BUN / CREAT RATIO: 13 (ref 12–28)
BUN: 14 mg/dL (ref 8–27)
CHLORIDE: 103 mmol/L (ref 96–106)
CO2: 23 mmol/L (ref 18–29)
CREATININE: 1.06 mg/dL — AB (ref 0.57–1.00)
Calcium: 9.4 mg/dL (ref 8.7–10.3)
GFR calc non Af Amer: 54 mL/min/{1.73_m2} — ABNORMAL LOW (ref >59)
GFR, EST AFRICAN AMERICAN: 63 mL/min/{1.73_m2} (ref >59)
GLUCOSE: 96 mg/dL (ref 65–99)
Globulin, Total: 2.5 g/dL (ref 1.5–4.5)
Potassium: 4.2 mmol/L (ref 3.5–5.2)
Sodium: 143 mmol/L (ref 134–144)
TOTAL PROTEIN: 6.7 g/dL (ref 6.0–8.5)

## 2016-07-24 LAB — LIPID PANEL W/O CHOL/HDL RATIO
CHOLESTEROL TOTAL: 189 mg/dL (ref 100–199)
HDL: 55 mg/dL (ref >39)
LDL CALC: 110 mg/dL — AB (ref 0–99)
Triglycerides: 122 mg/dL (ref 0–149)
VLDL CHOLESTEROL CAL: 24 mg/dL (ref 5–40)

## 2016-07-24 NOTE — Progress Notes (Signed)
Normal labs.  Pt notified through mychart

## 2016-11-22 LAB — HM DIABETES FOOT EXAM: HM Diabetic Foot Exam: NORMAL

## 2017-01-21 ENCOUNTER — Ambulatory Visit (INDEPENDENT_AMBULATORY_CARE_PROVIDER_SITE_OTHER): Payer: Medicare Other | Admitting: Unknown Physician Specialty

## 2017-01-21 ENCOUNTER — Encounter: Payer: Self-pay | Admitting: Unknown Physician Specialty

## 2017-01-21 VITALS — BP 126/83 | HR 71 | Temp 97.8°F | Ht 60.9 in | Wt 182.5 lb

## 2017-01-21 DIAGNOSIS — N183 Chronic kidney disease, stage 3 unspecified: Secondary | ICD-10-CM

## 2017-01-21 DIAGNOSIS — R7301 Impaired fasting glucose: Secondary | ICD-10-CM | POA: Diagnosis not present

## 2017-01-21 DIAGNOSIS — E784 Other hyperlipidemia: Secondary | ICD-10-CM | POA: Diagnosis not present

## 2017-01-21 DIAGNOSIS — E7849 Other hyperlipidemia: Secondary | ICD-10-CM

## 2017-01-21 LAB — LIPID PANEL PICCOLO, WAIVED
Chol/HDL Ratio Piccolo,Waive: 3.1 mg/dL
Cholesterol Piccolo, Waived: 184 mg/dL (ref <200)
HDL Chol Piccolo, Waived: 59 mg/dL (ref >59)
LDL CHOL CALC PICCOLO WAIVED: 96 mg/dL (ref <100)
Triglycerides Piccolo,Waived: 144 mg/dL (ref <150)
VLDL CHOL CALC PICCOLO,WAIVE: 29 mg/dL (ref <30)

## 2017-01-21 MED ORDER — FENOFIBRATE 160 MG PO TABS: 160.0000 mg | ORAL_TABLET | Freq: Every day | ORAL | 2 refills | Status: DC

## 2017-01-21 NOTE — Assessment & Plan Note (Signed)
BS 118.  Work on diet and exercise

## 2017-01-21 NOTE — Assessment & Plan Note (Signed)
GFR was 54 last visit.  Recheck

## 2017-01-21 NOTE — Assessment & Plan Note (Signed)
Stable today.  Continue present medications

## 2017-01-21 NOTE — Addendum Note (Signed)
Addended by: Kathrine Haddock on: 01/21/2017 09:30 AM   Modules accepted: Orders

## 2017-01-21 NOTE — Progress Notes (Signed)
   BP 126/83   Pulse 71   Temp 97.8 F (36.6 C)   Ht 5' 0.9" (1.547 m)   Wt 182 lb 8 oz (82.8 kg)   LMP  (LMP Unknown)   SpO2 95%   BMI 34.60 kg/m    Subjective:    Patient ID: Bethany Lara, female    DOB: 01/02/1951, 66 y.o.   MRN: [id number]  HPI: Bethany Lara is a 66 y.o. female  Chief Complaint  Patient presents with  . Hyperlipidemia    6 month f/up   Hyperlipidemia Using medications without problems: No Muscle aches  Diet compliance: States she needs to cut out bread and sweet.   Exercise: States she is exercising but gaining weight  CKD GFR 54 last visit.  Needs f/u today   Relevant past medical, surgical, family and social history reviewed and updated as indicated. Interim medical history since our last visit reviewed. Allergies and medications reviewed and updated.  Review of Systems  Per HPI unless specifically indicated above     Objective:    BP 126/83   Pulse 71   Temp 97.8 F (36.6 C)   Ht 5' 0.9" (1.547 m)   Wt 182 lb 8 oz (82.8 kg)   LMP  (LMP Unknown)   SpO2 95%   BMI 34.60 kg/m   Wt Readings from Last 3 Encounters:  01/21/17 182 lb 8 oz (82.8 kg)  07/23/16 172 lb 6.4 oz (78.2 kg)  06/20/16 172 lb (78 kg)    Physical Exam  Constitutional: She is oriented to person, place, and time. She appears well-developed and well-nourished. No distress.  HENT:  Head: Normocephalic and atraumatic.  Eyes: Conjunctivae and lids are normal. Right eye exhibits no discharge. Left eye exhibits no discharge. No scleral icterus.  Neck: Normal range of motion. Neck supple. No JVD present. Carotid bruit is not present.  Cardiovascular: Normal rate, regular rhythm and normal heart sounds.   Pulmonary/Chest: Effort normal and breath sounds normal.  Abdominal: Normal appearance. There is no splenomegaly or hepatomegaly.  Musculoskeletal: Normal range of motion.  Neurological: She is alert and oriented to person, place, and time.  Skin: Skin is  warm, dry and intact. No rash noted. No pallor.  Psychiatric: She has a normal mood and affect. Her behavior is normal. Judgment and thought content normal.    Results for orders placed or performed in visit on 01/11/17  HM DIABETES FOOT EXAM  Result Value Ref Range   HM Diabetic Foot Exam bilateral- normal       Assessment & Plan:   Problem List Items Addressed This Visit      Unprioritized   Chronic kidney disease, stage 3    GFR was 54 last visit.  Recheck      Hyperlipidemia - Primary    Stable today.  Continue present medications      Relevant Orders   Lipid Panel Piccolo, Waived   Comprehensive metabolic panel   IFG (impaired fasting glucose)    BS 118.  Work on diet and exercise          Follow up plan: Return in about 6 months (around 07/23/2017) for physical.

## 2017-01-22 LAB — COMPREHENSIVE METABOLIC PANEL
ALBUMIN: 4.1 g/dL (ref 3.6–4.8)
ALK PHOS: 64 IU/L (ref 39–117)
ALT: 18 IU/L (ref 0–32)
AST: 20 IU/L (ref 0–40)
Albumin/Globulin Ratio: 1.8 (ref 1.2–2.2)
BILIRUBIN TOTAL: 0.5 mg/dL (ref 0.0–1.2)
BUN / CREAT RATIO: 13 (ref 12–28)
BUN: 12 mg/dL (ref 8–27)
CHLORIDE: 105 mmol/L (ref 96–106)
CO2: 21 mmol/L (ref 18–29)
Calcium: 9.2 mg/dL (ref 8.7–10.3)
Creatinine, Ser: 0.91 mg/dL (ref 0.57–1.00)
GFR calc non Af Amer: 65 mL/min/{1.73_m2} (ref >59)
GFR, EST AFRICAN AMERICAN: 76 mL/min/{1.73_m2} (ref >59)
GLOBULIN, TOTAL: 2.3 g/dL (ref 1.5–4.5)
GLUCOSE: 111 mg/dL — AB (ref 65–99)
Potassium: 4.2 mmol/L (ref 3.5–5.2)
SODIUM: 141 mmol/L (ref 134–144)
TOTAL PROTEIN: 6.4 g/dL (ref 6.0–8.5)

## 2017-01-22 NOTE — Progress Notes (Signed)
Normal labs.  Pt notified through mychart

## 2017-04-09 ENCOUNTER — Ambulatory Visit
Admission: RE | Admit: 2017-04-09 | Discharge: 2017-04-09 | Disposition: A | Discharge status: Home / Self Care | Payer: Medicare Other | Source: Ambulatory Visit | Attending: Unknown Physician Specialty | Admitting: Unknown Physician Specialty

## 2017-04-09 DIAGNOSIS — Z1231 Encounter for screening mammogram for malignant neoplasm of breast: Secondary | ICD-10-CM | POA: Diagnosis present

## 2017-05-20 ENCOUNTER — Emergency Department: Payer: Medicare Other

## 2017-05-20 ENCOUNTER — Observation Stay
Admission: EM | Admit: 2017-05-20 | Discharge: 2017-05-21 | Disposition: A | Discharge status: Home / Self Care | Payer: Medicare Other | Attending: Specialist | Admitting: Specialist

## 2017-05-20 ENCOUNTER — Observation Stay: Payer: Medicare Other

## 2017-05-20 ENCOUNTER — Encounter: Payer: Self-pay | Admitting: Emergency Medicine

## 2017-05-20 DIAGNOSIS — G5601 Carpal tunnel syndrome, right upper limb: Secondary | ICD-10-CM | POA: Insufficient documentation

## 2017-05-20 DIAGNOSIS — R42 Dizziness and giddiness: Principal | ICD-10-CM

## 2017-05-20 DIAGNOSIS — R55 Syncope and collapse: Secondary | ICD-10-CM | POA: Diagnosis present

## 2017-05-20 DIAGNOSIS — R001 Bradycardia, unspecified: Secondary | ICD-10-CM

## 2017-05-20 DIAGNOSIS — Z9049 Acquired absence of other specified parts of digestive tract: Secondary | ICD-10-CM | POA: Insufficient documentation

## 2017-05-20 DIAGNOSIS — L309 Dermatitis, unspecified: Secondary | ICD-10-CM | POA: Insufficient documentation

## 2017-05-20 DIAGNOSIS — E876 Hypokalemia: Secondary | ICD-10-CM | POA: Diagnosis not present

## 2017-05-20 DIAGNOSIS — R29810 Facial weakness: Secondary | ICD-10-CM | POA: Diagnosis not present

## 2017-05-20 DIAGNOSIS — E1122 Type 2 diabetes mellitus with diabetic chronic kidney disease: Secondary | ICD-10-CM | POA: Insufficient documentation

## 2017-05-20 DIAGNOSIS — L723 Sebaceous cyst: Secondary | ICD-10-CM | POA: Insufficient documentation

## 2017-05-20 DIAGNOSIS — I129 Hypertensive chronic kidney disease with stage 1 through stage 4 chronic kidney disease, or unspecified chronic kidney disease: Secondary | ICD-10-CM | POA: Insufficient documentation

## 2017-05-20 DIAGNOSIS — J309 Allergic rhinitis, unspecified: Secondary | ICD-10-CM | POA: Diagnosis not present

## 2017-05-20 DIAGNOSIS — Z803 Family history of malignant neoplasm of breast: Secondary | ICD-10-CM | POA: Diagnosis not present

## 2017-05-20 DIAGNOSIS — Z800 Family history of malignant neoplasm of digestive organs: Secondary | ICD-10-CM | POA: Insufficient documentation

## 2017-05-20 DIAGNOSIS — D169 Benign neoplasm of bone and articular cartilage, unspecified: Secondary | ICD-10-CM | POA: Diagnosis not present

## 2017-05-20 DIAGNOSIS — E785 Hyperlipidemia, unspecified: Secondary | ICD-10-CM | POA: Insufficient documentation

## 2017-05-20 DIAGNOSIS — N183 Chronic kidney disease, stage 3 (moderate): Secondary | ICD-10-CM | POA: Diagnosis not present

## 2017-05-20 DIAGNOSIS — Z833 Family history of diabetes mellitus: Secondary | ICD-10-CM | POA: Diagnosis not present

## 2017-05-20 DIAGNOSIS — Z79899 Other long term (current) drug therapy: Secondary | ICD-10-CM | POA: Diagnosis not present

## 2017-05-20 DIAGNOSIS — Z8249 Family history of ischemic heart disease and other diseases of the circulatory system: Secondary | ICD-10-CM | POA: Insufficient documentation

## 2017-05-20 DIAGNOSIS — Z888 Allergy status to other drugs, medicaments and biological substances status: Secondary | ICD-10-CM | POA: Diagnosis not present

## 2017-05-20 DIAGNOSIS — Z7982 Long term (current) use of aspirin: Secondary | ICD-10-CM | POA: Diagnosis not present

## 2017-05-20 DIAGNOSIS — Z820 Family history of epilepsy and other diseases of the nervous system: Secondary | ICD-10-CM | POA: Insufficient documentation

## 2017-05-20 DIAGNOSIS — E8881 Metabolic syndrome: Secondary | ICD-10-CM | POA: Diagnosis not present

## 2017-05-20 LAB — URINALYSIS, ROUTINE W REFLEX MICROSCOPIC
BACTERIA UA: NONE SEEN
Bilirubin Urine: NEGATIVE
GLUCOSE, UA: NEGATIVE mg/dL
HGB URINE DIPSTICK: NEGATIVE
KETONES UR: NEGATIVE mg/dL
Nitrite: NEGATIVE
PROTEIN: NEGATIVE mg/dL
Specific Gravity, Urine: 1.016 (ref 1.005–1.030)
pH: 8 (ref 5.0–8.0)

## 2017-05-20 LAB — ETHANOL

## 2017-05-20 LAB — CBC
HCT: 41.5 % (ref 35.0–47.0)
HEMOGLOBIN: 14.1 g/dL (ref 12.0–16.0)
MCH: 29.5 pg (ref 26.0–34.0)
MCHC: 34.1 g/dL (ref 32.0–36.0)
MCV: 86.5 fL (ref 80.0–100.0)
PLATELETS: 322 10*3/uL (ref 150–440)
RBC: 4.79 MIL/uL (ref 3.80–5.20)
RDW: 13.3 % (ref 11.5–14.5)
WBC: 11.4 10*3/uL — ABNORMAL HIGH (ref 3.6–11.0)

## 2017-05-20 LAB — BASIC METABOLIC PANEL
ANION GAP: 10 (ref 5–15)
BUN: 10 mg/dL (ref 6–20)
CALCIUM: 9.3 mg/dL (ref 8.9–10.3)
CO2: 23 mmol/L (ref 22–32)
CREATININE: 0.8 mg/dL (ref 0.44–1.00)
Chloride: 109 mmol/L (ref 101–111)
Glucose, Bld: 127 mg/dL — ABNORMAL HIGH (ref 65–99)
Potassium: 3.3 mmol/L — ABNORMAL LOW (ref 3.5–5.1)
Sodium: 142 mmol/L (ref 135–145)

## 2017-05-20 LAB — TROPONIN I

## 2017-05-20 LAB — URINE DRUG SCREEN, QUALITATIVE (ARMC ONLY)
AMPHETAMINES, UR SCREEN: NOT DETECTED
BARBITURATES, UR SCREEN: NOT DETECTED
BENZODIAZEPINE, UR SCRN: NOT DETECTED
Cannabinoid 50 Ng, Ur ~~LOC~~: NOT DETECTED
Cocaine Metabolite,Ur ~~LOC~~: NOT DETECTED
MDMA (Ecstasy)Ur Screen: POSITIVE — AB
METHADONE SCREEN, URINE: NOT DETECTED
Opiate, Ur Screen: NOT DETECTED
Phencyclidine (PCP) Ur S: NOT DETECTED
TRICYCLIC, UR SCREEN: NOT DETECTED

## 2017-05-20 LAB — MAGNESIUM: Magnesium: 2 mg/dL (ref 1.7–2.4)

## 2017-05-20 LAB — PROTIME-INR
INR: 1.07
Prothrombin Time: 13.9 seconds (ref 11.4–15.2)

## 2017-05-20 LAB — TSH: TSH: 0.956 u[IU]/mL (ref 0.350–4.500)

## 2017-05-20 LAB — APTT: APTT: 24 s (ref 24–36)

## 2017-05-20 MED ORDER — STROKE: EARLY STAGES OF RECOVERY BOOK: Freq: Once | Status: DC

## 2017-05-20 MED ORDER — ACETAMINOPHEN 160 MG/5ML PO SOLN
650.0000 mg | ORAL | Status: DC | PRN
  Filled 2017-05-20: qty 20.3

## 2017-05-20 MED ORDER — POTASSIUM CHLORIDE 10 MEQ/100ML IV SOLN
10.0000 meq | INTRAVENOUS | Status: DC
  Administered 2017-05-20: 10 meq via INTRAVENOUS
  Filled 2017-05-20 (×4): qty 100

## 2017-05-20 MED ORDER — ASPIRIN 325 MG PO TABS
325.0000 mg | ORAL_TABLET | Freq: Every day | ORAL | Status: DC
  Filled 2017-05-20: qty 1

## 2017-05-20 MED ORDER — ACETAMINOPHEN 325 MG PO TABS
650.0000 mg | ORAL_TABLET | ORAL | Status: DC | PRN
  Administered 2017-05-20: 650 mg via ORAL
  Filled 2017-05-20: qty 2

## 2017-05-20 MED ORDER — ONDANSETRON HCL 4 MG/2ML IJ SOLN
4.0000 mg | Freq: Once | INTRAMUSCULAR | Status: AC
  Administered 2017-05-20: 4 mg via INTRAVENOUS
  Filled 2017-05-20: qty 2

## 2017-05-20 MED ORDER — SODIUM CHLORIDE 0.9 % IV SOLN
INTRAVENOUS | Status: DC
  Administered 2017-05-20: 17:00:00 via INTRAVENOUS

## 2017-05-20 MED ORDER — SODIUM CHLORIDE 0.9 % IV BOLUS (SEPSIS)
1000.0000 mL | Freq: Once | INTRAVENOUS | Status: AC
  Administered 2017-05-20: 1000 mL via INTRAVENOUS

## 2017-05-20 MED ORDER — POTASSIUM CHLORIDE 10 MEQ/100ML IV SOLN
10.0000 meq | INTRAVENOUS | Status: DC
  Filled 2017-05-20 (×4): qty 100

## 2017-05-20 MED ORDER — ENOXAPARIN SODIUM 40 MG/0.4ML ~~LOC~~ SOLN
40.0000 mg | SUBCUTANEOUS | Status: DC
  Administered 2017-05-20: 40 mg via SUBCUTANEOUS
  Filled 2017-05-20: qty 0.4

## 2017-05-20 MED ORDER — ASPIRIN 300 MG RE SUPP: 300.0000 mg | Freq: Every day | RECTAL | Status: DC

## 2017-05-20 MED ORDER — ACETAMINOPHEN 650 MG RE SUPP: 650.0000 mg | RECTAL | Status: DC | PRN

## 2017-05-20 MED ORDER — ASPIRIN 81 MG PO CHEW
324.0000 mg | CHEWABLE_TABLET | Freq: Once | ORAL | Status: DC
  Filled 2017-05-20: qty 4

## 2017-05-20 MED ORDER — POTASSIUM CHLORIDE CRYS ER 20 MEQ PO TBCR
40.0000 meq | EXTENDED_RELEASE_TABLET | Freq: Once | ORAL | Status: AC
  Administered 2017-05-20: 40 meq via ORAL
  Filled 2017-05-20: qty 2

## 2017-05-20 MED ORDER — LORATADINE 10 MG PO TABS
10.0000 mg | ORAL_TABLET | Freq: Every day | ORAL | Status: DC
  Administered 2017-05-21: 10 mg via ORAL
  Filled 2017-05-20: qty 1

## 2017-05-20 MED ORDER — PRAVASTATIN SODIUM 40 MG PO TABS
40.0000 mg | ORAL_TABLET | Freq: Every day | ORAL | Status: DC
Start: 2017-05-20 — End: 2017-05-21
  Administered 2017-05-20: 40 mg via ORAL
  Filled 2017-05-20: qty 1

## 2017-05-20 MED ORDER — ATROPINE SULFATE 1 MG/10ML IJ SOSY: 0.5000 mg | PREFILLED_SYRINGE | INTRAMUSCULAR | Status: DC | PRN

## 2017-05-20 MED ORDER — FENOFIBRATE 160 MG PO TABS
160.0000 mg | ORAL_TABLET | Freq: Every day | ORAL | Status: DC
  Administered 2017-05-21: 160 mg via ORAL
  Filled 2017-05-20: qty 1

## 2017-05-20 NOTE — ED Notes (Signed)
Pt c/o worsening dizziness at this time. This RN to bedside, pt HR initially 70, pt brady to 74, MD notified and at bedside at this time. Sharee Pimple, RN, Vicente Males, RN, this RN, and Dr. Kerman Passey at bedside at this time.

## 2017-05-20 NOTE — Progress Notes (Signed)
Kennedy responded to a PG for a Code Stroke. Pt was in CT upon my arrival. Husband is bedside. Pt returned for CT and was being assessed by the Neurologist. Pt complained of feeling dizzy. CH saw they were in a good space and excused myself. [facility] provided the ministry of presence and prayer.    05/20/17 1300  Clinical Encounter Type  Visited With Patient;Patient and family together;Health care provider  Visit Type Initial;Spiritual support;Code;ED  Referral From Nurse  Consult/Referral To Chaplain  Spiritual Encounters  Spiritual Needs Prayer;Emotional

## 2017-05-20 NOTE — ED Notes (Signed)
This RN to bedside, pt's SO and pastor at bedside at this time. Pt states she is feeling bedside. Medication administered per MD order. Will continue to monitor for further patient needs.

## 2017-05-20 NOTE — Consult Note (Signed)
Referring Physician: Paduchowski    Chief Complaint: Dizziness  HPI: Bethany Lara is an 66 y.o. female who reports that she awakened today at baseline.  After eating breakfast went back to her room and when she bent over had acute onset of dizziness.  With no resolution of her symptoms called EMS.  Was initially seen in the ED with only complaints of dizziness but later complained of some right sided numbness.  These symptoms resolved by the time she was evaluated for the code stroke  Initial NIHSS of 1.   Per husband patient currently under a lot of stress.    Date last known well: Date: 05/20/2017 Time last known well: Time: 09:30 tPA Given: No: Improvement in symptoms  Past Medical History:  Diagnosis Date  . Allergy   . Carpal tunnel syndrome   . Hyperlipidemia     Past Surgical History:  Procedure Laterality Date  . BUNIONECTOMY    . CARPAL TUNNEL RELEASE  11/14/2015  . CHOLECYSTECTOMY      Family History  Problem Relation Age of Onset  . Diabetes Mother   . Hypertension Mother   . Dementia Mother   . Cancer Father        colon  . Diabetes Father   . Heart disease Father   . Alzheimer's disease Father   . Glaucoma Father   . Breast cancer Maternal Aunt    Social History:  reports that she has never smoked. She has never used smokeless tobacco. She reports that she does not drink alcohol or use drugs.  Allergies:  Allergies  Allergen Reactions  . Zocor [Simvastatin] Other (See Comments)    Fatigue, legs achey     Medications:  I have reviewed the patient's current medications. Prior to Admission:  (Not in a hospital admission) Scheduled: . aspirin  324 mg Oral Once    ROS: History obtained from the patient  General ROS: negative for - chills, fatigue, fever, night sweats, weight gain or weight loss Psychological ROS: negative for - behavioral disorder, hallucinations, memory difficulties, mood swings or suicidal ideation Ophthalmic ROS: negative  for - blurry vision, double vision, eye pain or loss of vision ENT ROS: negative for - epistaxis, nasal discharge, oral lesions, sore throat, tinnitus or vertigo Allergy and Immunology ROS: negative for - hives or itchy/watery eyes Hematological and Lymphatic ROS: negative for - bleeding problems, bruising or swollen lymph nodes Endocrine ROS: negative for - galactorrhea, hair pattern changes, polydipsia/polyuria or temperature intolerance Respiratory ROS: negative for - cough, hemoptysis, shortness of breath or wheezing Cardiovascular ROS: negative for - chest pain, dyspnea on exertion, edema or irregular heartbeat Gastrointestinal ROS: negative for - abdominal pain, diarrhea, hematemesis, nausea/vomiting or stool incontinence Genito-Urinary ROS: negative for - dysuria, hematuria, incontinence or urinary frequency/urgency Musculoskeletal ROS: right CTS Neurological ROS: as noted in HPI Dermatological ROS: negative for rash and skin lesion changes  Physical Examination: Blood pressure 118/60, pulse (!) 54, temperature 97.6 F (36.4 C), temperature source Oral, resp. rate 10, height 5\' 1"  (1.549 m), weight 81.6 kg (180 lb), SpO2 100 %.  HEENT-  Normocephalic, no lesions, without obvious abnormality.  Normal external eye and conjunctiva.  Normal TM's bilaterally.  Normal auditory canals and external ears. Normal external nose, mucus membranes and septum.  Normal pharynx. Cardiovascular- S1, S2 normal, pulses palpable throughout   Lungs- chest clear, no wheezing, rales, normal symmetric air entry Abdomen- soft, non-tender; bowel sounds normal; no masses,  no organomegaly Extremities- no edema Lymph-no  adenopathy palpable Musculoskeletal-no joint tenderness, deformity or swelling Skin-warm and dry, no hyperpigmentation, vitiligo, or suspicious lesions  Neurological Examination   Mental Status: Alert, oriented, thought content appropriate.  Speech fluent without evidence of aphasia.  Able to  follow 3 step commands without difficulty. Cranial Nerves: II: Discs flat bilaterally; Visual fields grossly normal, pupils equal, round, reactive to light and accommodation III,IV, VI: ptosis not present, extra-ocular motions intact bilaterally V,VII: mild decrease in the right NLF, facial light touch sensation normal bilaterally VIII: hearing normal bilaterally IX,X: gag reflex present XI: bilateral shoulder shrug XII: midline tongue extension Motor: Right : Upper extremity   5/5    Left:     Upper extremity   5/5  Lower extremity   5/5     Lower extremity   5/5 Tone and bulk:normal tone throughout; no atrophy noted Sensory: Pinprick and light touch intact throughout, bilaterally Deep Tendon Reflexes: 2+ and symmetric throughout Plantars: Right: downgoing   Left: downgoing Cerebellar: Normal finger-to-nose and normal heel-to-shin testing bilaterally Gait: not tested due to severe dizziness    Laboratory Studies:  Basic Metabolic Panel:  Recent Labs Lab 05/20/17 1038  NA 142  K 3.3*  CL 109  CO2 23  GLUCOSE 127*  BUN 10  CREATININE 0.80  CALCIUM 9.3    Liver Function Tests: No results for input(s): AST, ALT, ALKPHOS, BILITOT, PROT, ALBUMIN in the last 168 hours. No results for input(s): LIPASE, AMYLASE in the last 168 hours. No results for input(s): AMMONIA in the last 168 hours.  CBC:  Recent Labs Lab 05/20/17 1038  WBC 11.4*  HGB 14.1  HCT 41.5  MCV 86.5  PLT 322    Cardiac Enzymes:  Recent Labs Lab 05/20/17 1038  TROPONINI <0.03    BNP: Invalid input(s): POCBNP  CBG: No results for input(s): GLUCAP in the last 168 hours.  Microbiology: No results found for this or any previous visit.  Coagulation Studies:  Recent Labs  05/20/17 1315  LABPROT 13.9  INR 1.07    Urinalysis: No results for input(s): COLORURINE, LABSPEC, PHURINE, GLUCOSEU, HGBUR, BILIRUBINUR, KETONESUR, PROTEINUR, UROBILINOGEN, NITRITE, LEUKOCYTESUR in the last 168  hours.  Invalid input(s): APPERANCEUR  Lipid Panel:    Component Value Date/Time   CHOL 184 01/21/2017 0855   TRIG 144 01/21/2017 0855   HDL 55 07/23/2016 0905   VLDL 29 01/21/2017 0855   LDLCALC 110 (H) 07/23/2016 0905    HgbA1C: No results found for: HGBA1C  Urine Drug Screen:  No results found for: LABOPIA, COCAINSCRNUR, LABBENZ, AMPHETMU, THCU, LABBARB  Alcohol Level:  Recent Labs Lab 05/20/17 [place] <5    Other results: EKG: sinus rhythm at 66 bpm.  Imaging: Ct Head Code Stroke Wo Contrast  Result Date: 05/20/2017 CLINICAL DATA:  Code stroke. Sudden onset of syncopal episode with dizziness and head pressure, slurred speech. EXAM: CT HEAD WITHOUT CONTRAST TECHNIQUE: Contiguous axial images were obtained from the base of the skull through the vertex without intravenous contrast. COMPARISON:  None. FINDINGS: Brain: No evidence for acute infarction, hemorrhage, mass lesion, hydrocephalus, or extra-axial fluid. Normal for age cerebral volume. Hypoattenuation of white matter could represent small vessel disease. Vascular: Calcification of the intracranial vertebral arteries consistent with cerebrovascular atherosclerotic disease. No signs of intracranial large vessel occlusion. Skull: No fracture.  Incidental osteoma RIGHT frontal bone. Sinuses/Orbits: None. Other: Superficial scalp sebaceous cyst in the LEFT frontal region, incidental finding ASPECTS [facility] Stroke Program Early CT Score) - Ganglionic level infarction (caudate, lentiform  nuclei, internal capsule, insula, M1-M3 cortex): 7 - Supraganglionic infarction (M4-M6 cortex): 3 Total score (0-10 with 10 being normal): 10 IMPRESSION: 1. No acute stroke.  Chronic changes as described. 2. ASPECTS is 10. These results were called by telephone at the time of interpretation on 05/20/2017 at 1:15 pm to Dr. Harvest Dark , who verbally acknowledged these results. Electronically Signed   By: Staci Righter M.D.   On: 05/20/2017 13:17     Assessment: 66 y.o. female with a history of hyperlipidemia who presents with acute onset dizziness.  Has had some right sided paresthesias that were short lived as well.  Head CT reviewed and shows no acute changes.  Neurological examination improving.  Unlikely acute infarct or TIA but will work up further.  Patient also with fluctuating HR and appears to be symptomatic.   Stroke Risk Factors - hyperlipidemia  Plan: 1. HgbA1c, fasting lipid panel 2. MRI of the brain without contrast 3. PT consult, OT consult, Speech consult 4. Echocardiogram 5. Carotid dopplers 6. Prophylactic therapy-Antiplatelet med: Aspirin - dose 81mg  daily 7. NPO until RN stroke swallow screen 8. Telemetry monitoring 9. Frequent neuro checks 10. Cardiology involvement   Alexis Goodell, MD Neurology [phone] 05/20/2017, 1:54 PM

## 2017-05-20 NOTE — ED Notes (Addendum)
Call light answered. Pt stated she didn't feel good. Her head feels weird and she is dizzy. RN notified. Pt informed that MD has ordered nausea medication and RN is with another pt and as soon as she is available she will be with pt. Pt has two visitors.

## 2017-05-20 NOTE — ED Notes (Signed)
Pt returned from CT at this time.  

## 2017-05-20 NOTE — ED Provider Notes (Signed)
[name] Emergency Department Provider Note  Time seen: 11:12 AM  I have reviewed the triage vital signs and the nursing notes.   HISTORY  Chief Complaint Near Syncope    HPI Bethany Lara is a 66 y.o. female with a past medical history of hyperlipidemia, chronic kidney disease, presents to the emergency department for near syncope. According to the patient this morning she began feeling very lightheaded like she was going to passout became nauseated and diaphoretic. Denies any chest pain. She arrived at the emergency department and had an additional episode of near syncope again became very lightheaded and diaphoretic. Patient became bradycardic down to 43 bpm. Patient denies any chest pain states mild shortness of breath. Denies any abdominal pain. States mild nausea. Denies diarrhea or dysuria. Denies fever.  Past Medical History:  Diagnosis Date  . Allergy   . Carpal tunnel syndrome   . Hyperlipidemia     Patient Active Problem List   Diagnosis Date Noted  . Chronic kidney disease, stage 3 01/21/2017  . Metabolic syndrome 74/25/9563  . IFG (impaired fasting glucose) 01/20/2016  . Eczema 07/22/2015  . Hyperlipidemia 07/15/2015  . Allergic rhinitis 07/15/2015  . Carpal tunnel syndrome 07/15/2015    Past Surgical History:  Procedure Laterality Date  . BUNIONECTOMY    . CARPAL TUNNEL RELEASE  11/14/2015  . CHOLECYSTECTOMY      Prior to Admission medications   Medication Sig Start Date End Date Taking? Authorizing Provider  aspirin 81 MG tablet Take 81 mg by mouth daily.    [provider]  cetirizine (ZYRTEC) 10 MG tablet Take 1 tablet (10 mg total) by mouth daily. Patient taking differently: Take 10 mg by mouth daily as needed.  06/20/16   Volney American, PA-C  fenofibrate 160 MG tablet Take 1 tablet (160 mg total) by mouth daily. 01/21/17   Kathrine Haddock, NP  hydrOXYzine (VISTARIL) 50 MG capsule Take 1 capsule (50 mg  total) by mouth 3 (three) times daily as needed. 06/20/16   Volney American, PA-C  Multiple Vitamin (MULTIVITAMIN) tablet Take 1 tablet by mouth daily.    [provider]  pravastatin (PRAVACHOL) 40 MG tablet Take 1 tablet (40 mg total) by mouth at bedtime. 07/23/16   Kathrine Haddock, NP  VITAMIN D, CHOLECALCIFEROL, PO Take 2,000 Units by mouth daily.    [provider]    Allergies  Allergen Reactions  . Zocor [Simvastatin] Other (See Comments)    Fatigue, legs achey     Family History  Problem Relation Age of Onset  . Diabetes Mother   . Hypertension Mother   . Dementia Mother   . Cancer Father        colon  . Diabetes Father   . Heart disease Father   . Alzheimer's disease Father   . Glaucoma Father   . Breast cancer Maternal Aunt     Social History Social History  Substance Use Topics  . Smoking status: Never Smoker  . Smokeless tobacco: Never Used  . Alcohol use No    Review of Systems Constitutional: Negative for fever. Cardiovascular: Negative for chest pain. Respiratory: Mild shortness of breath Gastrointestinal: Negative for abdominal pain Musculoskeletal: Negative for back pain. Neurological: Negative for headache All other ROS negative  ____________________________________________   PHYSICAL EXAM:  VITAL SIGNS: ED Triage Vitals  Enc Vitals Group     BP 05/20/17 1036 128/73     Pulse Rate 05/20/17 1036 (!) 57  Resp 05/20/17 1036 18     Temp 05/20/17 1036 97.6 F (36.4 C)     Temp Source 05/20/17 1036 Oral     SpO2 05/20/17 1035 95 %     Weight 05/20/17 1037 180 lb (81.6 kg)     Height 05/20/17 1037 5\' 1"  (1.549 m)     Head Circumference --      Peak Flow --      Pain Score --      Pain Loc --      Pain Edu? --      Excl. in [place]? --    Constitutional: Alert and oriented. Well appearing and in no distress. Eyes: Normal exam ENT   Head: Normocephalic and atraumatic.   Mouth/Throat: Mucous membranes are  moist. Cardiovascular: Normal rate, regular rhythm. No murmur Respiratory: Normal respiratory effort without tachypnea nor retractions. Breath sounds are clear Gastrointestinal: Soft and nontender. No distention. Musculoskeletal: Nontender with normal range of motion in all extremities. No lower extremity tenderness or edema. Neurologic:  Normal speech and language. No gross focal neurologic deficits are appreciated. Skin:  Skin is warm, dry and intact.  Psychiatric: Mood and affect are normal. Speech and behavior are normal.   ____________________________________________    EKG  EKG reviewed and interpreted by myself shows normal sinus rhythm at 66 bpm, narrow QRS, normal axis, normal intervals, no ST changes.  EKG reviewed and interpreted by myself shows sinus bradycardia at 44 bpm, narrow QRS, normal axis, normal intervals, no concerning ST changes.  ____________________________________________    RADIOLOGY  CT head negative  ____________________________________________   INITIAL IMPRESSION / ASSESSMENT AND PLAN / ED COURSE  Pertinent labs & imaging results that were available during my care of the patient were reviewed by me and considered in my medical decision making (see chart for details).  Patient presents to the emergency department for a near syncopal episode. Patient states this morning she felt lightheaded and became somewhat diaphoretic. States it happened once again later today so she came to the emergency department for evaluation. Patient denies any chest pain. In the emergency department the patient once again had an episode of feeling like she was, pass out she became somewhat diaphoretic and her heart rate noted to drop into the 40s but appeared to be a sinus rhythm.  Patient's labs are resulted largely within normal limits including a negative troponin. Patient states she continues to have episodes of dizziness. She is called me back into the room saying that  her right hand is feeling somewhat numb and tingling. States a history of carpal tunnel to which this feels identical. Shortly later she was saying that her right leg felt different although denies any numbness or weakness. This time I once again performed a neurological examination and the patient is noted to have a mild right facial droop. Upon further questioning the patient states she did have to put eyedrops in her right eye this morning because it felt dry. She states the last time that she feels that she felt completely normal was 9:30 AM. Given the new right facial droop which appears to have developed in the emergency department we will activate a code stroke protocols. However as the patient awoke with a dry right eye and appears to have a right facial droop it is unclear the exact timeline of onset. We'll obtain a CT head and have neurology consult. Repeat neurological exam shows equal grip strengths bilaterally. Patient does state some tingling in the right hand  but again states this feels like her tunnel syndrome which is chronic. Denies any sensation differences in her lower extremities. Denies any weakness. 5/5 motor in all extremities. With no noted extremity drift. On exam patient does have a mild to moderate right facial droop which appears to be new. Sensation is intact and equal in the face.  NIH Stroke Scale   Interval: Baseline Time: 1:05 PM Person Administering Scale: Daune Colgate  Administer stroke scale items in the order listed. Record performance in each category after each subscale exam. Do not go back and change scores. Follow directions provided for each exam technique. Scores should reflect what the patient does, not what the clinician thinks the patient can do. The clinician should record answers while administering the exam and work quickly. Except where indicated, the patient should not be coached (i.e., repeated requests to patient to make a special effort).   1a   Level of consciousness: 0=alert; keenly responsive  1b. LOC questions:  0=Performs both tasks correctly  1c. LOC commands: 0=Performs both tasks correctly  2.  Best Gaze: 0=normal  3.  Visual: 0=No visual loss  4. Facial Palsy: 1=Minor paralysis (flattened nasolabial fold, asymmetric on smiling)  5a.  Motor left arm: 0=No drift, limb holds 90 (or 45) degrees for full 10 seconds  5b.  Motor right arm: 0=No drift, limb holds 90 (or 45) degrees for full 10 seconds  6a. motor left leg: 0=No drift, limb holds 90 (or 45) degrees for full 10 seconds  6b  Motor right leg:  0=No drift, limb holds 90 (or 45) degrees for full 10 seconds  7. Limb Ataxia: 0=Absent  8.  Sensory: 1=Mild to moderate sensory loss; patient feels pinprick is less sharp or is dull on the affected side; there is a loss of superficial pain with pinprick but patient is aware She is being touched  9. Best Language:  0=No aphasia, normal  10. Dysarthria: 0=Normal  11. Extinction and Inattention: 0=No abnormality  12. Distal motor function: 0=Normal   Total:   2    CT head is negative. Patient has been seen by neurology, they recommended admission for TIA/CVA workup.   ____________________________________________   FINAL CLINICAL IMPRESSION(S) / ED DIAGNOSES  Weakness Near-syncope Right facial droop    Harvest Dark, MD 05/20/17 1326

## 2017-05-20 NOTE — H&P (Signed)
[facility] NAME: Bethany Lara    MR#:  [id number]  DATE OF BIRTH:  Feb 04, 1951  DATE OF ADMISSION:  05/20/2017  PRIMARY CARE PHYSICIAN: Kathrine Haddock, NP   REQUESTING/REFERRING PHYSICIAN:   CHIEF COMPLAINT:  dizziness  HISTORY OF PRESENT ILLNESS:  Bethany Lara  is a 67 y.o. female with a known history of chronic carpal tunnel syndrome with right hand tingling, hyperlipidemia and allergic rhinitis is presenting to the ED with a chief complaint of dizzy spells. Patient denies any passing out spells. Denies any chest pain or shortness of breath. Heart rate was slow at  30 to 40s in the ED. Patient denies any headache or blurry vision but she has right facial droop and right-sided weakness, CT head done in the ED with no acute findings. ED physician has discussed with the on-call neurologist after calling code stroke.neurologist has recommended to admit the patient for complete stroke workup. According to the husband at bedside patient does not have any right fcial droop and she has chronic right hand tingling from carpal tunnel syndrome. Patient is still feeling weak and tired during my examination  PAST MEDICAL HISTORY:   Past Medical History:  Diagnosis Date  . Allergy   . Carpal tunnel syndrome   . Hyperlipidemia     PAST SURGICAL HISTOIRY:   Past Surgical History:  Procedure Laterality Date  . BUNIONECTOMY    . CARPAL TUNNEL RELEASE  11/14/2015  . CHOLECYSTECTOMY      SOCIAL HISTORY:   Social History  Substance Use Topics  . Smoking status: Never Smoker  . Smokeless tobacco: Never Used  . Alcohol use No    FAMILY HISTORY:   Family History  Problem Relation Age of Onset  . Diabetes Mother   . Hypertension Mother   . Dementia Mother   . Cancer Father        colon  . Diabetes Father   . Heart disease Father   . Alzheimer's disease Father   . Glaucoma Father   . Breast cancer Maternal Aunt      DRUG ALLERGIES:   Allergies  Allergen Reactions  . Zocor [Simvastatin] Other (See Comments)    Fatigue, legs achey     REVIEW OF SYSTEMS:  CONSTITUTIONAL: No fever, fatigue or weakness.  EYES: No blurred or double vision.  EARS, NOSE, AND THROAT: No tinnitus or ear pain.  RESPIRATORY: No cough, shortness of breath, wheezing or hemoptysis.  CARDIOVASCULAR: No chest pain, orthopnea, edema.  GASTROINTESTINAL: No nausea, vomiting, diarrhea or abdominal pain.  GENITOURINARY: No dysuria, hematuria.  ENDOCRINE: No polyuria, nocturia,  HEMATOLOGY: No anemia, easy bruising or bleeding SKIN: No rash or lesion. MUSCULOSKELETAL: No joint pain or arthritis.   NEUROLOGIC: chronic right wrist tingling from carpal tunnel syndrome, denies numbness, weakness.  PSYCHIATRY: No anxiety or depression.   MEDICATIONS AT HOME:   Prior to Admission medications   Medication Sig Start Date End Date Taking? Authorizing Provider  aspirin 81 MG tablet Take 81 mg by mouth daily.   Yes [provider]  fenofibrate 160 MG tablet Take 1 tablet (160 mg total) by mouth daily. 01/21/17  Yes Kathrine Haddock, NP  Multiple Vitamin (MULTIVITAMIN) tablet Take 1 tablet by mouth daily.   Yes [provider]  pravastatin (PRAVACHOL) 40 MG tablet Take 1 tablet (40 mg total) by mouth at bedtime. 07/23/16  Yes Kathrine Haddock, NP  VITAMIN D, CHOLECALCIFEROL, PO Take 2,000 Units by mouth  daily.   Yes [provider]  cetirizine (ZYRTEC) 10 MG tablet Take 1 tablet (10 mg total) by mouth daily. Patient taking differently: Take 10 mg by mouth daily as needed.  06/20/16   Volney American, PA-C  hydrOXYzine (VISTARIL) 50 MG capsule Take 1 capsule (50 mg total) by mouth 3 (three) times daily as needed. 06/20/16   Volney American, PA-C      VITAL SIGNS:  Blood pressure 118/60, pulse (!) 54, temperature 97.6 F (36.4 C), temperature source Oral, resp. rate 10, height 5\' 1"  (1.549 m), weight  81.6 kg (180 lb), SpO2 100 %.  PHYSICAL EXAMINATION:  GENERAL:  66 y.o.-year-old patient lying in the bed with no acute distress.  EYES: Pupils equal, round, reactive to light and accommodation. No scleral icterus. Extraocular muscles intact.  HEENT: Head atraumatic, normocephalic. Oropharynx and nasopharynx clear.  NECK:  Supple, no jugular venous distention. No thyroid enlargement, no tenderness.  LUNGS: Normal breath sounds bilaterally, no wheezing, rales,rhonchi or crepitation. No use of accessory muscles of respiration.  CARDIOVASCULAR: S1, S2 normal. No murmurs, rubs, or gallops.  ABDOMEN: Soft, nontender, nondistended. Bowel sounds present. No organomegaly or mass.  EXTREMITIES: No pedal edema, cyanosis, or clubbing.  NEUROLOGIC:minimal right facial droop is noticed no pronator drift. Cranial nerves II through XII are intact. Muscle strength 5/5 in all extremities. Sensation intact. Gait not checked.  PSYCHIATRIC: The patient is alert and oriented x 3.  SKIN: No obvious rash, lesion, or ulcer.   LABORATORY PANEL:   CBC  Recent Labs Lab 05/20/17 1038  WBC 11.4*  HGB 14.1  HCT 41.5  PLT 322   ------------------------------------------------------------------------------------------------------------------  Chemistries   Recent Labs Lab 05/20/17 1038  NA 142  K 3.3*  CL 109  CO2 23  GLUCOSE 127*  BUN 10  CREATININE 0.80  CALCIUM 9.3   ------------------------------------------------------------------------------------------------------------------  Cardiac Enzymes  Recent Labs Lab 05/20/17 1038  TROPONINI <0.03   ------------------------------------------------------------------------------------------------------------------  RADIOLOGY:  Ct Head Code Stroke Wo Contrast  Result Date: 05/20/2017 CLINICAL DATA:  Code stroke. Sudden onset of syncopal episode with dizziness and head pressure, slurred speech. EXAM: CT HEAD WITHOUT CONTRAST TECHNIQUE: Contiguous  axial images were obtained from the base of the skull through the vertex without intravenous contrast. COMPARISON:  None. FINDINGS: Brain: No evidence for acute infarction, hemorrhage, mass lesion, hydrocephalus, or extra-axial fluid. Normal for age cerebral volume. Hypoattenuation of white matter could represent small vessel disease. Vascular: Calcification of the intracranial vertebral arteries consistent with cerebrovascular atherosclerotic disease. No signs of intracranial large vessel occlusion. Skull: No fracture.  Incidental osteoma RIGHT frontal bone. Sinuses/Orbits: None. Other: Superficial scalp sebaceous cyst in the LEFT frontal region, incidental finding ASPECTS [facility] Stroke Program Early CT Score) - Ganglionic level infarction (caudate, lentiform nuclei, internal capsule, insula, M1-M3 cortex): 7 - Supraganglionic infarction (M4-M6 cortex): 3 Total score (0-10 with 10 being normal): 10 IMPRESSION: 1. No acute stroke.  Chronic changes as described. 2. ASPECTS is 10. These results were called by telephone at the time of interpretation on 05/20/2017 at 1:15 pm to Dr. Harvest Dark , who verbally acknowledged these results. Electronically Signed   By: Staci Righter M.D.   On: 05/20/2017 13:17    EKG:   Orders placed or performed during the hospital encounter of 05/20/17  . ED EKG  . ED EKG  . EKG 12-Lead  . EKG 12-Lead  . EKG 12-Lead  . EKG 12-Lead  . ED EKG  . ED EKG  IMPRESSION AND PLAN:  Bethany Lara  is a 66 y.o. female with a known history of chronic carpal tunnel syndrome with right hand tingling, hyperlipidemia and allergic rhinitis is presenting to the ED with a chief complaint of dizzy spells. Patient denies any passing out spells. Denies any chest pain or shortness of breath. Heart rate was slow at  30 to 40s in the ED. Patient denies any headache or blurry vision but she has right facial droop and right-sided weakness, CT head done in the ED with no acute findings. ED  physician has discussed with the on-call neurologist after calling code stroke  # Near Syncope secondary to symptomatic bradycardia Admit to telemetry Patient is not on any rate limiting drugs,etiology unclear Consult cardiology- [facility] , per pts request as her husband goes to Dr. Fletcher Anon Atropine as needed for symptomatic bradycardia with  sustained heart rat than 30 Check TSH CT head is negative Will get echocardiogram Hydrate with IV fluids  #TIA With right facial droop CT head is negative Stroke workup with carotid Dopplers, 2-D echocardiogram and MRI/MRA of the brain The patient has passed bedside swallow evaluation Neuro checks Aspirin Neurology consult Check fasting lipid panel, in the interim will continue her home medication fenofibrate and pravastatin 40 mg by mouth daily at bedtime PT, OT evaluation Check TSH and hemoglobin A1c  # hypokalemia replete n recheck  #Hyperlipidemia continue home medication fenofibrate and pravastatin. Check fasting lipid panel  #Allergic rhinitis; continue home medications at present   Provide GI prophylaxis and DVT prophylaxis with Lovenox subcutaneous  All the records are reviewed and case discussed with ED provider. Management plans discussed with the patient, family and they are in agreement.  CODE STATUS: fc , husband HCPOA  TOTAL TIME TAKING CARE OF THIS PATIENT: 45  minutes.   Note: This dictation was prepared with Dragon dictation along with smaller phrase technology. Any transcriptional errors that result from this process are unintentional.  Nicholes Mango M.D on 05/20/2017 at 2:03 PM  Between 7am to 6pm - Pager - [phone]  After 6pm go to [email or web address] - password [email or web address] [facility]  Office  [phone]  CC: Primary care physician; Kathrine Haddock, NP

## 2017-05-20 NOTE — ED Notes (Signed)
This RN to bedside. This RN explained to patient and SO that MD was aware of fluctuating HR and dizziness and that no new orders had been received. Pt states, "does he know how bad I feel?" This RN explained that MD was aware of how bad patient felt and apologized for delay. Pt remains in bed at this time with visitor at bedside. Will continue to monitor for further patient needs or complaints.

## 2017-05-20 NOTE — ED Notes (Signed)
Pt taken to CT and returned to room and hooked back to monitor.

## 2017-05-20 NOTE — Consult Note (Signed)
Cardiology Consultation Note  Patient ID: Bethany Lara, MRN: [id number], DOB/AGE: 12-27-1950 66 y.o. Admit date: 05/20/2017   Date of Consult: 05/20/2017 Primary Physician: Bethany Haddock, NP Primary Cardiologist: Bethany Lara Requesting Physician: Dr. Margaretmary Eddy, MD  Chief Complaint: Dizziness Reason for Consult: Bradycardia  HPI: Bethany Lara is a 66 y.o. female who is being seen today for the evaluation of bradycardia at the request of Dr. Margaretmary Eddy, MD. Patient has a h/o hyperlipidemia and carpal tunnel syndrome with chronic right upper extremity paresthesias who presented to [facility] on 8/27 with dizziness.   No previously known cardiac history. Patient reports she woke up in her usual state of health this morning, ate breakfast and went to go get dressed. Upon getting to her back bedroom, she suddenly felt "like the room was spinning around me." She braced herself on her bed. She walked to the kitchen to take an ASA and call EMS. Upon EMS arrival the room spinning sensation was improving though remained. She denies any chest pain, shortness of breath, palpitations, nausea, vomiting. Never had any symptoms similar. She reports her heart rate at baseline runs in the 50s bpm. Her PCP is Bethany Lara (not in epic to review prior pulse rates). No prior pulse rates for review.   Upon patient's arrival to [facility] blood pressure was noted to be 128/73, heart rate 57 bpm, respirations 18, temperature 97.11F, oxygen saturation 97% on room air, weight 180 pounds. Heart rate has consistently remained in the low to upper 50s bpm with an isolated reading of 70 bpm at 1400 on 8/27. Labs showed an initial troponin of less than 0.03, leukocytosis of 11.4, hemoglobin 14.1, platelet count 322, potassium 3.3, serum creatinine 0.80, ethanol less than 5. CT-guided showed no acute process. 12-lead EKG showed NSR, 66 bpm, no acute ST-T changes. Given patient's right sided paresthesias patient was seen by  neurology. At that time, symptoms were improving. It was felt unlikely patient had suffered acute infarct or TIA. MRI of the brain without contrast and carotid ultrasound were recommended. Hemoglobin A1c and fasting lipid panel were also recommended for further risk stratification. Review of telemetry has shown a predominant rate int he mid 50s bpm. Occasional 40s bpm, rare mid 30s bpm and 80s bpm. Rare blocked PAC. She is not on any rate limiting medications at home. Patient continued to note intermittent "room spinning sensation in the ED and on 2A that have not coincided with further bradycardia, pauses, or arrhythmia. Given patient's bradycardic heart rate cardiology was asked to evaluate.    Past Medical History:  Diagnosis Date  . Allergy   . Carpal tunnel syndrome   . Hyperlipidemia       Most Recent Cardiac Studies: n/a   Surgical History:  Past Surgical History:  Procedure Laterality Date  . BUNIONECTOMY    . CARPAL TUNNEL RELEASE  11/14/2015  . CHOLECYSTECTOMY       Home Meds: Prior to Admission medications   Medication Sig Start Date End Date Taking? Authorizing Provider  aspirin 81 MG tablet Take 81 mg by mouth daily.   Yes [provider]  fenofibrate 160 MG tablet Take 1 tablet (160 mg total) by mouth daily. 01/21/17  Yes Bethany Haddock, NP  Multiple Vitamin (MULTIVITAMIN) tablet Take 1 tablet by mouth daily.   Yes [provider]  pravastatin (PRAVACHOL) 40 MG tablet Take 1 tablet (40 mg total) by mouth at bedtime. 07/23/16  Yes Bethany Haddock, NP  VITAMIN  D, CHOLECALCIFEROL, PO Take 2,000 Units by mouth daily.   Yes [provider]  cetirizine (ZYRTEC) 10 MG tablet Take 1 tablet (10 mg total) by mouth daily. Patient taking differently: Take 10 mg by mouth daily as needed.  06/20/16   Volney American, PA-C  hydrOXYzine (VISTARIL) 50 MG capsule Take 1 capsule (50 mg total) by mouth 3 (three) times daily as needed. 06/20/16   Volney American, PA-C    Inpatient Medications:  . aspirin  324 mg Oral Once   . potassium chloride      Allergies:  Allergies  Allergen Reactions  . Zocor [Simvastatin] Other (See Comments)    Fatigue, legs achey     Social History   Social History  . Marital status: Married    Spouse name: N/A  . Number of children: N/A  . Years of education: N/A   Occupational History  . Not on file.   Social History Main Topics  . Smoking status: Never Smoker  . Smokeless tobacco: Never Used  . Alcohol use No  . Drug use: No  . Sexual activity: Yes   Other Topics Concern  . Not on file   Social History Narrative  . No narrative on file     Family History  Problem Relation Age of Onset  . Diabetes Mother   . Hypertension Mother   . Dementia Mother   . Cancer Father        colon  . Diabetes Father   . Heart disease Father   . Alzheimer's disease Father   . Glaucoma Father   . Breast cancer Maternal Aunt      Review of Systems: Review of Systems  Constitutional: Positive for malaise/fatigue. Negative for chills, diaphoresis, fever and weight loss.  HENT: Negative for congestion.   Eyes: Negative for discharge and redness.  Respiratory: Negative for cough, hemoptysis, sputum production, shortness of breath and wheezing.   Cardiovascular: Negative for chest pain, palpitations, orthopnea, claudication, leg swelling and PND.  Gastrointestinal: Negative for abdominal pain, blood in stool, heartburn, melena, nausea and vomiting.  Genitourinary: Negative for hematuria.  Musculoskeletal: Negative for falls and myalgias.  Skin: Negative for rash.  Neurological: Positive for dizziness and weakness. Negative for tingling, tremors, sensory change, speech change, focal weakness, loss of consciousness and headaches.       "Room spinning"  Endo/Heme/Allergies: Does not bruise/bleed easily.  Psychiatric/Behavioral: Negative for substance abuse. The patient is not nervous/anxious.     All other systems reviewed and are negative.   Labs:  Recent Labs  05/20/17 1038  TROPONINI <0.03   Lab Results  Component Value Date   WBC 11.4 (H) 05/20/2017   HGB 14.1 05/20/2017   HCT 41.5 05/20/2017   MCV 86.5 05/20/2017   PLT 322 05/20/2017    Recent Labs Lab 05/20/17 1038  NA 142  K 3.3*  CL 109  CO2 23  BUN 10  CREATININE 0.80  CALCIUM 9.3  GLUCOSE 127*   Lab Results  Component Value Date   CHOL 184 01/21/2017   HDL 55 07/23/2016   LDLCALC 110 (H) 07/23/2016   TRIG 144 01/21/2017   No results found for: DDIMER  Radiology/Studies:  Ct Head Code Stroke Wo Contrast  Result Date: 05/20/2017 IMPRESSION: 1. No acute stroke.  Chronic changes as described. 2. ASPECTS is 10. These results were called by telephone at the time of interpretation on 05/20/2017 at 1:15 pm to Dr. Harvest Dark , who verbally acknowledged  these results. Electronically Signed   By: Staci Righter M.D.   On: 05/20/2017 13:17    EKG: Interpreted by me showed: NSR, 66 bpm, no acute ST-T changes Telemetry: Interpreted by me showed: currently sinus bradycardia mid 50s bpm. Has had episodes of sinus bradycardia into the low 30s bpm. Rare blocked PACs. Brief episode into 80s bpm.   Weights: Filed Weights   05/20/17 1037 05/20/17 1628  Weight: 180 lb (81.6 kg) 184 lb (83.5 kg)     Physical Exam: Blood pressure (!) 97/45, pulse (!) 56, temperature 97.6 F (36.4 C), temperature source Oral, resp. rate 18, height 5\' 1"  (1.549 m), weight 184 lb (83.5 kg), SpO2 98 %. Body mass index is 34.77 kg/m. General: Well developed, well nourished, in no acute distress. Head: Normocephalic, atraumatic, sclera non-icteric, no xanthomas, nares are without discharge. Positional head movement exacerbate vertigo-like symptoms.  Neck: Negative for carotid bruits. JVD not elevated. Lungs: Clear bilaterally to auscultation without wheezes, rales, or rhonchi. Breathing is unlabored. Heart: Bradycardic with  S1 S2. No murmurs, rubs, or gallops appreciated. Abdomen: Soft, non-tender, non-distended with normoactive bowel sounds. No hepatomegaly. No rebound/guarding. No obvious abdominal masses. Msk:  Strength and tone appear normal for age. Extremities: No clubbing or cyanosis. No edema. Distal pedal pulses are 2+ and equal bilaterally. Neuro: Alert and oriented X 3. No facial asymmetry. No focal deficit. Moves all extremities spontaneously. Psych:  Responds to questions appropriately with a normal affect.    Assessment and Plan:  Active Problems:   Near syncope    1. Bradycardia:  -Currently with heart rate in the mid 50s bpm -Had an episode of "severe room spinning" while I was in the room with her that was exacerbated with head movement. Review of telemetry during that time showed sinus bradycarida in the upper 50s bpm without pauses or arrhythmia  -No baseline heart rates for review in Epic or care everywhere  -Obtain records from PCP's office (Bethany Lara) to review prior heart rates as she indicates a bradycardic rate in the 50s bpm is her baseline  -Monitor on telemetry  -When necessary atropine  -Not on any rate limiting medications at home  -Has demonstrated appropriate chronotropic response -May need PPM if bradycardia persists with correction of electrolyte abnormalities, though there does not appear to be any acute indication for this at this time -Check TSH and magnesium  -Recommend correction of any abnormalities overnight by covering service   2. Vertigo/dizziness/possible TIA:  -Patient indicates intermittent "room spinning" that is worse with positional head movements -She indicates a long history of bradycardia and that this is not Bethany for her -We will try to obtain records from PCP's office to review prior vital signs for low heart rate -Consider evaluation to treatment for vertigo, defer to IM -CT had not acute  -MRI brain pending  -Carotid ultrasound pending   -Transthoracic echocardiogram pending  -Monitor on telemetry   3. Hypokalemia:  -Replete to goal of at least 4.0  -Check magnesium with recommendation to replete to goal of at least 2.0   4. Hyperlipidemia:  -Check fasting lipid panel  -LDL not at goal recommend changing pravastatin to high intensity statin   5. Carpal tunnel syndrome:  -Per internal medicine    Signed, Christell Faith, PA-C [place] Pager: [phone] 05/20/2017, 4:39 PM

## 2017-05-20 NOTE — ED Notes (Signed)
MD to bedside at this time 

## 2017-05-20 NOTE — ED Notes (Signed)
This RN called to bedside due to call bell. Pt states "please help me!". This RN explained again that MD was aware of patient's complaints of dizziness and not feeling well and variable HR. Pt and visitor noted to be irritated at this time about delay, this RN explained and apologized for delay, explained waiting for resting of blood work to result. MD once again notified of patient complaints. Will continue to monitor for further patient needs. Lab called in regards to troponin results.

## 2017-05-20 NOTE — ED Notes (Signed)
This tech to bedside due to call light. Pt stating she is dizzy and head pressure. Visitor states heart rate is going up and down. Both were informed by this tech that RN would be notified and they are treating the nausea with the meds that were given, they are watching the vitals from the nurses station and there isn't much they can do for dizzy until results are in, but I would inform them of there complaints. Visitor not very happy.

## 2017-05-20 NOTE — ED Notes (Addendum)
Pt taken to CT by Pam, EDT.

## 2017-05-20 NOTE — ED Notes (Signed)
This RN to bedside pt c/o continued dizziness at this time. Pt HR noted to be 40, 2nd EKG collected at this time. MD notified of patient bradycardia. No new orders received.

## 2017-05-20 NOTE — ED Triage Notes (Signed)
Pt presents to ED via [facility] with c/o dizziness and near syncope. Per EMS pt awoke this morning feeling fine, then approx 45 mins ago bent down to reach for something and she felt really dizzy, pt states she sat down and dizziness did not resolve. EMS reports upon their arrival pt was pale and diaphoretic, ems reports that patient denies any cardiac hx but that she reports she had a cardiac stress test "a few years ago". Pt is noted to be pale and diaphoretic upon arrival to ED. VSS. EMS also reports that patient had 1st degree AV block on EMS EKG that resolved on PTA.

## 2017-05-20 NOTE — ED Notes (Signed)
This RN to bedside, pt now complaining of her R leg feeling different than her L leg, and her R fingers feeling numb, and pain behind her L eye. This RN explained would notify MD, pt states "please tell him to hurry!" MD notified of patient complaints, VORB for CT Head W/O contrast.

## 2017-05-20 NOTE — ED Notes (Signed)
Peyton Najjar, RN and Dr. Doy Mince at bedside at this time.

## 2017-05-20 NOTE — ED Notes (Signed)
Pt call light answered. Pt stated she was dizzy. RN notified.

## 2017-05-21 ENCOUNTER — Observation Stay: Payer: Medicare Other

## 2017-05-21 ENCOUNTER — Observation Stay: Admit: 2017-05-21 | Payer: Medicare Other

## 2017-05-21 DIAGNOSIS — R001 Bradycardia, unspecified: Secondary | ICD-10-CM | POA: Diagnosis not present

## 2017-05-21 DIAGNOSIS — R42 Dizziness and giddiness: Secondary | ICD-10-CM | POA: Diagnosis not present

## 2017-05-21 LAB — LIPID PANEL
CHOL/HDL RATIO: 4.2 ratio
Cholesterol: 190 mg/dL (ref 0–200)
HDL: 45 mg/dL (ref >40)
LDL CALC: 92 mg/dL (ref 0–99)
Triglycerides: 264 mg/dL — ABNORMAL HIGH (ref <150)
VLDL: 53 mg/dL — AB (ref 0–40)

## 2017-05-21 LAB — HEMOGLOBIN A1C
Hgb A1c MFr Bld: 5.5 % (ref 4.8–5.6)
Mean Plasma Glucose: 111.15 mg/dL

## 2017-05-21 MED ORDER — MECLIZINE HCL 25 MG PO TABS
25.0000 mg | ORAL_TABLET | Freq: Three times a day (TID) | ORAL | 0 refills | Status: DC | PRN
Start: 2017-05-21 — End: 2017-05-24

## 2017-05-21 NOTE — Progress Notes (Signed)
Subjective: Patient reports no further vertigo.  Feels much better today.    Objective: Current vital signs: BP 138/65 (BP Location: Left Arm)   Pulse (!) 53   Temp 97.9 F (36.6 C) (Oral)   Resp 18   Ht 5\' 1"  (1.549 m)   Wt 83.5 kg (184 lb)   LMP  (LMP Unknown)   SpO2 96%   BMI 34.77 kg/m  Vital signs in last 24 hours: Temp:  [97.6 F (36.4 C)-97.9 F (36.6 C)] 97.9 F (36.6 C) (08/28 0510) Pulse Rate:  [50-70] 53 (08/28 0510) Resp:  [10-20] 18 (08/28 0510) BP: (97-141)/(45-73) 138/65 (08/28 0510) SpO2:  [95 %-100 %] 96 % (08/28 0510) Weight:  [81.6 kg (180 lb)-83.5 kg (184 lb)] 83.5 kg (184 lb) (08/27 1628)  Intake/Output from previous day: 08/27 0701 - 08/28 0700 In: 550 [I.V.:450; IV Piggyback:100] Out: 2550 [Urine:2550] Intake/Output this shift: Total I/O In: 360 [P.O.:360] Out: 600 [Urine:600] Nutritional status: Diet 2 gram sodium Room service appropriate? Yes; Fluid consistency: Thin Diet - low sodium heart healthy  Neurologic Exam: Mental Status: Alert, oriented, thought content appropriate.  Speech fluent without evidence of aphasia.  Able to follow 3 step commands without difficulty. Cranial Nerves: II: Discs flat bilaterally; Visual fields grossly normal, pupils equal, round, reactive to light and accommodation III,IV, VI: ptosis not present, extra-ocular motions intact bilaterally V,VII: mild decrease in the right NLF, facial light touch sensation normal bilaterally VIII: hearing normal bilaterally IX,X: gag reflex present XI: bilateral shoulder shrug XII: midline tongue extension Motor: Right :  Upper extremity   5/5                                      Left:     Upper extremity   5/5             Lower extremity   5/5                                                  Lower extremity   5/5 Tone and bulk:normal tone throughout; no atrophy noted Cerebellar: Normal finger-to-nose and normal heel-to-shin testing bilaterally   Lab Results: Basic  Metabolic Panel:  Recent Labs Lab 05/20/17 1038 05/20/17 1801  NA 142  --   K 3.3*  --   CL 109  --   CO2 23  --   GLUCOSE 127*  --   BUN 10  --   CREATININE 0.80  --   CALCIUM 9.3  --   MG  --  2.0    Liver Function Tests: No results for input(s): AST, ALT, ALKPHOS, BILITOT, PROT, ALBUMIN in the last 168 hours. No results for input(s): LIPASE, AMYLASE in the last 168 hours. No results for input(s): AMMONIA in the last 168 hours.  CBC:  Recent Labs Lab 05/20/17 1038  WBC 11.4*  HGB 14.1  HCT 41.5  MCV 86.5  PLT 322    Cardiac Enzymes:  Recent Labs Lab 05/20/17 1038  TROPONINI <0.03    Lipid Panel:  Recent Labs Lab 05/21/17 0520  CHOL 190  TRIG 264*  HDL 45  CHOLHDL 4.2  VLDL 53*  LDLCALC 92    CBG: No results for input(s): GLUCAP in the last 168 hours.  Microbiology: No results found for this or any previous visit.  Coagulation Studies:  Recent Labs  05/20/17 1315  LABPROT 13.9  INR 1.07    Imaging: Mr Brain Wo Contrast  Result Date: 05/20/2017 CLINICAL DATA:  Near syncope. EXAM: MRI HEAD WITHOUT CONTRAST MRA HEAD WITHOUT CONTRAST TECHNIQUE: Multiplanar, multiecho pulse sequences of the brain and surrounding structures were obtained without intravenous contrast. Angiographic images of the head were obtained using MRA technique without contrast. COMPARISON:  Head CT 05/20/2017 FINDINGS: MRI HEAD FINDINGS Brain: The midline structures are normal. No focal diffusion restriction to indicate acute infarct. No intraparenchymal hemorrhage. There is mild hyperintense T2-weighted signal within the periventricular white matter, most often seen in the setting of chronic microvascular ischemia. No mass lesion. No chronic microhemorrhage or cerebral amyloid angiopathy. No hydrocephalus, age advanced atrophy or lobar predominant volume loss. No dural abnormality or extra-axial collection. Skull and upper cervical spine: The visualized skull base, calvarium,  upper cervical spine and extracranial soft tissues are normal. Sinuses/Orbits: No fluid levels or advanced mucosal thickening. No mastoid effusion. Normal orbits. MRA HEAD FINDINGS Intracranial internal carotid arteries: Normal. Anterior cerebral arteries: Normal. Middle cerebral arteries: Normal. Posterior communicating arteries: Absent bilaterally. Posterior cerebral arteries: Normal. Basilar artery: Normal. Vertebral arteries: Left dominant. Normal. Superior cerebellar arteries: Normal. Anterior inferior cerebellar arteries: Normal. Posterior inferior cerebellar arteries: Normal. IMPRESSION: 1. No acute intracranial abnormality or emergent large vessel occlusion. 2. Normal intracranial MRA. 3. 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 Otherwise normal brain for age. Electronically Signed   By: Ulyses Jarred M.D.   On: 05/20/2017 22:17   Mr Jodene Nam Head/brain QV Cm  Result Date: 05/20/2017 CLINICAL DATA:  Near syncope. EXAM: MRI HEAD WITHOUT CONTRAST MRA HEAD WITHOUT CONTRAST TECHNIQUE: Multiplanar, multiecho pulse sequences of the brain and surrounding structures were obtained without intravenous contrast. Angiographic images of the head were obtained using MRA technique without contrast. COMPARISON:  Head CT 05/20/2017 FINDINGS: MRI HEAD FINDINGS Brain: The midline structures are normal. No focal diffusion restriction to indicate acute infarct. No intraparenchymal hemorrhage. There is mild hyperintense T2-weighted signal within the periventricular white matter, most often seen in the setting of chronic microvascular ischemia. No mass lesion. No chronic microhemorrhage or cerebral amyloid angiopathy. No hydrocephalus, age advanced atrophy or lobar predominant volume loss. No dural abnormality or extra-axial collection. Skull and upper cervical spine: The visualized skull base, calvarium, upper cervical spine and extracranial soft tissues are normal.  Sinuses/Orbits: No fluid levels or advanced mucosal thickening. No mastoid effusion. Normal orbits. MRA HEAD FINDINGS Intracranial internal carotid arteries: Normal. Anterior cerebral arteries: Normal. Middle cerebral arteries: Normal. Posterior communicating arteries: Absent bilaterally. Posterior cerebral arteries: Normal. Basilar artery: Normal. Vertebral arteries: Left dominant. Normal. Superior cerebellar arteries: Normal. Anterior inferior cerebellar arteries: Normal. Posterior inferior cerebellar arteries: Normal. IMPRESSION: 1. No acute intracranial abnormality or emergent large vessel occlusion. 2. Normal intracranial MRA. 3. 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 Otherwise normal brain for age. Electronically Signed   By: Ulyses Jarred M.D.   On: 05/20/2017 22:17   Ct Head Code Stroke Wo Contrast  Result Date: 05/20/2017 CLINICAL DATA:  Code stroke. Sudden onset of syncopal episode with dizziness and head pressure, slurred speech. EXAM: CT HEAD WITHOUT CONTRAST TECHNIQUE: Contiguous axial images were obtained from the base of the skull through the vertex without intravenous contrast. COMPARISON:  None. FINDINGS: Brain: No evidence for acute infarction, hemorrhage, mass lesion, hydrocephalus, or extra-axial fluid. Normal for age  cerebral volume. Hypoattenuation of white matter could represent small vessel disease. Vascular: Calcification of the intracranial vertebral arteries consistent with cerebrovascular atherosclerotic disease. No signs of intracranial large vessel occlusion. Skull: No fracture.  Incidental osteoma RIGHT frontal bone. Sinuses/Orbits: None. Other: Superficial scalp sebaceous cyst in the LEFT frontal region, incidental finding ASPECTS [facility] Stroke Program Early CT Score) - Ganglionic level infarction (caudate, lentiform nuclei, internal capsule, insula, M1-M3 cortex): 7 - Supraganglionic infarction (M4-M6 cortex): 3 Total score  (0-10 with 10 being normal): 10 IMPRESSION: 1. No acute stroke.  Chronic changes as described. 2. ASPECTS is 10. These results were called by telephone at the time of interpretation on 05/20/2017 at 1:15 pm to Dr. Harvest Dark , who verbally acknowledged these results. Electronically Signed   By: Staci Righter M.D.   On: 05/20/2017 13:17    Medications:  I have reviewed the patient's current medications. Scheduled: .  stroke: mapping our early stages of recovery book   Does not apply Once  . aspirin  324 mg Oral Once  . aspirin  300 mg Rectal Daily   Or  . aspirin  325 mg Oral Daily  . enoxaparin (LOVENOX) injection  40 mg Subcutaneous Q24H  . fenofibrate  160 mg Oral Daily  . loratadine  10 mg Oral Daily  . pravastatin  40 mg Oral QHS    Assessment/Plan: Patient without further vertigo.  MRI of the brain reviewed and shows no acute changes.  Do not suspect vertigo was related to cerebral ischemia.   No further neurologic intervention is recommended at this time.  If further questions arise, please call or page at that time.  Thank you for allowing neurology to participate in the care of this patient.    LOS: 0 days   Alexis Goodell, MD Neurology [phone] 05/21/2017  10:35 AM

## 2017-05-21 NOTE — Care Management Obs Status (Signed)
[facility] NOTIFICATION   Patient Details  Name: Bethany Lara MRN: [id number] Date of Birth: 01-Jun-1951   Medicare Observation Status Notification Given:  Yes    Marshell Garfinkel, RN 05/21/2017, 9:19 AM

## 2017-05-21 NOTE — Progress Notes (Signed)
Patient Name: Bethany Lara Date of Encounter: 05/21/2017  Primary Cardiologist: New to [facility] - consult by [facility] Problem List     Principal Problem:   Vertigo Active Problems:   Near syncope   Bradycardia   Hypokalemia     Subjective   No further vertigo. Heart rate has demonstrated appropriate chronotropic response into the 70s on telemetry. Symptoms never coincided with heart rate. Echo and carotid ultrasound pending. Urine drug screen positive for MDMA. TSH normal. Magnesium at goal. No potassium this morning.   Inpatient Medications    Scheduled Meds: .  stroke: mapping our early stages of recovery book   Does not apply Once  . aspirin  324 mg Oral Once  . aspirin  300 mg Rectal Daily   Or  . aspirin  325 mg Oral Daily  . enoxaparin (LOVENOX) injection  40 mg Subcutaneous Q24H  . fenofibrate  160 mg Oral Daily  . loratadine  10 mg Oral Daily  . pravastatin  40 mg Oral QHS   Continuous Infusions:  PRN Meds: acetaminophen **OR** acetaminophen (TYLENOL) oral liquid 160 mg/5 mL **OR** acetaminophen, atropine   Vital Signs    Vitals:   05/20/17 1938 05/20/17 2226 05/21/17 0012 05/21/17 0510  BP: 137/65 (!) 141/64 133/60 138/65  Pulse: (!) 56 (!) 53 (!) 59 (!) 53  Resp: 18   18  Temp: 97.8 F (36.6 C)   97.9 F (36.6 C)  TempSrc: Oral   Oral  SpO2: 99% 98% 96% 96%  Weight:      Height:        Intake/Output Summary (Last 24 hours) at 05/21/17 0736 Last data filed at 05/21/17 0511  Gross per 24 hour  Intake              550 ml  Output             2550 ml  Net            -2000 ml   Filed Weights   05/20/17 1037 05/20/17 1628  Weight: 180 lb (81.6 kg) 184 lb (83.5 kg)    Physical Exam    GEN: Well nourished, well developed, in no acute distress.  HEENT: Grossly normal.  Neck: Supple, no JVD, carotid bruits, or masses. Cardiac: Mildly bradycardic, no murmurs, rubs, or gallops. No clubbing, cyanosis, edema.  Radials/DP/PT 2+ and equal  bilaterally.  Respiratory:  Respirations regular and unlabored, clear to auscultation bilaterally. GI: Soft, nontender, nondistended, BS + x 4. MS: no deformity or atrophy. Skin: warm and dry, no rash. Neuro:  Strength and sensation are intact. Psych: AAOx3.  Normal affect.  Labs    CBC  Recent Labs  05/20/17 1038  WBC 11.4*  HGB 14.1  HCT 41.5  MCV 86.5  PLT 983   Basic Metabolic Panel  Recent Labs  05/20/17 1038 05/20/17 1801  NA 142  --   K 3.3*  --   CL 109  --   CO2 23  --   GLUCOSE 127*  --   BUN 10  --   CREATININE 0.80  --   CALCIUM 9.3  --   MG  --  2.0   Liver Function Tests No results for input(s): AST, ALT, ALKPHOS, BILITOT, PROT, ALBUMIN in the last 72 hours. No results for input(s): LIPASE, AMYLASE in the last 72 hours. Cardiac Enzymes  Recent Labs  05/20/17 1038  TROPONINI <0.03   BNP Invalid input(s): POCBNP D-Dimer  No results for input(s): DDIMER in the last 72 hours. Hemoglobin A1C No results for input(s): HGBA1C in the last 72 hours. Fasting Lipid Panel  Recent Labs  05/21/17 0520  CHOL 190  HDL 45  LDLCALC 92  TRIG 264*  CHOLHDL 4.2   Thyroid Function Tests  Recent Labs  05/20/17 1801  TSH 0.956    Telemetry    Sinus bradycardia mid 50s bpm, has had episodes of NSR into the 70s bpm - Personally Reviewed  ECG    n/a - Personally Reviewed  Radiology    Mr Brain Wo Contrast  Result Date: 05/20/2017 IMPRESSION: 1. No acute intracranial abnormality or emergent large vessel occlusion. 2. Normal intracranial MRA. 3. 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 Otherwise normal brain for age. Electronically Signed   By: Ulyses Jarred M.D.   On: 05/20/2017 22:17   Mr Jodene Nam Head/brain OQ Cm  Result Date: 05/20/2017 IMPRESSION: 1. No acute intracranial abnormality or emergent large vessel occlusion. 2. Normal intracranial MRA. 3. Mild white matter hyperintensity, likely  secondary to chronic hypertensive microangiopathy. Otherwise normal brain for age. Electronically Signed   By: Ulyses Jarred M.D.   On: 05/20/2017 22:17   Ct Head Code Stroke Wo Contrast  Result Date: 05/20/2017 IMPRESSION: 1. No acute stroke.  Chronic changes as described. 2. ASPECTS is 10. These results were called by telephone at the time of interpretation on 05/20/2017 at 1:15 pm to Dr. Harvest Dark , who verbally acknowledged these results. Electronically Signed   By: Staci Righter M.D.   On: 05/20/2017 13:17    Cardiac Studies   TTE pending  Patient Profile     66 y.o. female with history of hyperlipidemia and carpal tunnel syndrome with chronic right upper extremity paresthesias who presented to [facility] on 8/27 with vertigo.   Assessment & Plan    1. Bradycardia: -Asymptomatic  -Has baseline heart rate in the mid 50s bpm -Symptoms have not coincided with heart rate -Has demonstrated appropriate chronotropic response -No indication for PPM -Would avoid rate-limiting medications given her baseline heart rate -Ambulate  2. Vertigo: -Improved -Per IM -Needs ENT follow up -TTE and carotid ultrasound pending  3. MDMA abuse: -Cessation advised   Signed, Marcille Blanco [facility] Pager: [phone] 05/21/2017, 7:36 AM

## 2017-05-21 NOTE — Discharge Summary (Signed)
[facility] [place] NAME: Bethany Lara    MR#:  [id number]  DATE OF BIRTH:  05-17-1951  DATE OF ADMISSION:  05/20/2017 ADMITTING PHYSICIAN: Nicholes Mango, MD  DATE OF DISCHARGE: 05/21/2017  1:25 PM  PRIMARY CARE PHYSICIAN: Kathrine Haddock, NP    ADMISSION DIAGNOSIS:  Facial droop [R29.810] Near syncope [R55]  DISCHARGE DIAGNOSIS:  Principal Problem:   Vertigo Active Problems:   Near syncope   Bradycardia   Hypokalemia   SECONDARY DIAGNOSIS:   Past Medical History:  Diagnosis Date  . Allergy   . Carpal tunnel syndrome   . Hyperlipidemia     HOSPITAL COURSE:   66 year old female with past medical history of hyperlipidemia, seasonal allergies, history of carpal tunnel syndrome will presented to the hospital due to vertigo.  1. Dizziness/vertigo-patient presented to the hospital with vertiginous symptoms. Patient was suspected to have a CVA and therefore underwent an extensive workup including CT head, MRI/MRA of the brain, carotid duplex which shows no evidence of any acute pathology. Patient was also seen by cardiology as her heart rate was significantly low but as per them her dizziness is not related to patient having some mild bradycardia. -Patient was not orthostatic. Her symptoms have not resolved. This was suspected to be vertigo and therefore patient was discharged on some meclizine as needed. Patient was advised if she continues to get these symptoms to seek evaluation by ENT as an outpatient.  2. Hyperlipidemia-patient will continue Pravachol.  3. Seasonal allergies-patient will continue her Zyrtec.  DISCHARGE CONDITIONS:   Stable.  CONSULTS OBTAINED:  Treatment Team:  Catarina Hartshorn, MD Wellington Hampshire, MD  DRUG ALLERGIES:   Allergies  Allergen Reactions  . Zocor [Simvastatin] Other (See Comments)    Fatigue, legs achey     DISCHARGE MEDICATIONS:   Allergies as of 05/21/2017      Reactions    Zocor [simvastatin] Other (See Comments)   Fatigue, legs achey      Medication List    TAKE these medications   aspirin 81 MG tablet Take 81 mg by mouth daily.   cetirizine 10 MG tablet Commonly known as:  ZYRTEC Take 1 tablet (10 mg total) by mouth daily. What changed:  when to take this  reasons to take this   fenofibrate 160 MG tablet Take 1 tablet (160 mg total) by mouth daily.   hydrOXYzine 50 MG capsule Commonly known as:  VISTARIL Take 1 capsule (50 mg total) by mouth 3 (three) times daily as needed.   meclizine 25 MG tablet Commonly known as:  ANTIVERT Take 1 tablet (25 mg total) by mouth 3 (three) times daily as needed for dizziness.   multivitamin tablet Take 1 tablet by mouth daily.   pravastatin 40 MG tablet Commonly known as:  PRAVACHOL Take 1 tablet (40 mg total) by mouth at bedtime.   VITAMIN D (CHOLECALCIFEROL) PO Take 2,000 Units by mouth daily.            Discharge Care Instructions        Start     Ordered   05/21/17 0000  meclizine (ANTIVERT) 25 MG tablet  3 times daily PRN     05/21/17 0924   05/21/17 0000  Activity as tolerated - No restrictions     05/21/17 0924   05/21/17 0000  Diet - low sodium heart healthy     05/21/17 0924        DISCHARGE INSTRUCTIONS:   DIET:  Low fat, Low cholesterol diet  DISCHARGE CONDITION:  Stable  ACTIVITY:  Activity as tolerated  OXYGEN:  Home Oxygen: No.   Oxygen Delivery: room air  DISCHARGE LOCATION:  home   If you experience worsening of your admission symptoms, develop shortness of breath, life threatening emergency, suicidal or homicidal thoughts you must seek medical attention immediately by calling 911 or calling your MD immediately  if symptoms less severe.  You Must read complete instructions/literature along with all the possible adverse reactions/side effects for all the Medicines you take and that have been prescribed to you. Take any new Medicines after you have  completely understood and accpet all the possible adverse reactions/side effects.   Please note  You were cared for by a hospitalist during your hospital stay. If you have any questions about your discharge medications or the care you received while you were in the hospital after you are discharged, you can call the unit and asked to speak with the hospitalist on call if the hospitalist that took care of you is not available. Once you are discharged, your primary care physician will handle any further medical issues. Please note that NO REFILLS for any discharge medications will be authorized once you are discharged, as it is imperative that you return to your primary care physician (or establish a relationship with a primary care physician if you do not have one) for your aftercare needs so that they can reassess your need for medications and monitor your lab values.     Today   Dizziness now resolved.  No N/V.  Ambulating well.  No other acute complaints or events overnight. Will d/c home.   VITAL SIGNS:  Blood pressure 138/65, pulse (!) 53, temperature 97.9 F (36.6 C), temperature source Oral, resp. rate 18, height 5\' 1"  (1.549 m), weight 83.5 kg (184 lb), SpO2 96 %.  I/O:   Intake/Output Summary (Last 24 hours) at 05/21/17 1600 Last data filed at 05/21/17 1307  Gross per 24 hour  Intake             1030 ml  Output             3150 ml  Net            -2120 ml    PHYSICAL EXAMINATION:  GENERAL:  66 y.o.-year-old patient lying in the bed with no acute distress.  EYES: Pupils equal, round, reactive to light and accommodation. No scleral icterus. Extraocular muscles intact.  HEENT: Head atraumatic, normocephalic. Oropharynx and nasopharynx clear.  NECK:  Supple, no jugular venous distention. No thyroid enlargement, no tenderness.  LUNGS: Normal breath sounds bilaterally, no wheezing, rales,rhonchi. No use of accessory muscles of respiration.  CARDIOVASCULAR: S1, S2 normal. No  murmurs, rubs, or gallops.  ABDOMEN: Soft, non-tender, non-distended. Bowel sounds present. No organomegaly or mass.  EXTREMITIES: No pedal edema, cyanosis, or clubbing.  NEUROLOGIC: Cranial nerves II through XII are intact. No focal motor or sensory defecits b/l.  PSYCHIATRIC: The patient is alert and oriented x 3.  SKIN: No obvious rash, lesion, or ulcer.   DATA REVIEW:   CBC  Recent Labs Lab 05/20/17 1038  WBC 11.4*  HGB 14.1  HCT 41.5  PLT 322    Chemistries   Recent Labs Lab 05/20/17 1038 05/20/17 1801  NA 142  --   K 3.3*  --   CL 109  --   CO2 23  --   GLUCOSE 127*  --   BUN 10  --  CREATININE 0.80  --   CALCIUM 9.3  --   MG  --  2.0    Cardiac Enzymes  Recent Labs Lab 05/20/17 1038  TROPONINI <0.03    Microbiology Results  No results found for this or any previous visit.  RADIOLOGY:  Mr Brain Wo Contrast  Result Date: 05/20/2017 CLINICAL DATA:  Near syncope. EXAM: MRI HEAD WITHOUT CONTRAST MRA HEAD WITHOUT CONTRAST TECHNIQUE: Multiplanar, multiecho pulse sequences of the brain and surrounding structures were obtained without intravenous contrast. Angiographic images of the head were obtained using MRA technique without contrast. COMPARISON:  Head CT 05/20/2017 FINDINGS: MRI HEAD FINDINGS Brain: The midline structures are normal. No focal diffusion restriction to indicate acute infarct. No intraparenchymal hemorrhage. There is mild hyperintense T2-weighted signal within the periventricular white matter, most often seen in the setting of chronic microvascular ischemia. No mass lesion. No chronic microhemorrhage or cerebral amyloid angiopathy. No hydrocephalus, age advanced atrophy or lobar predominant volume loss. No dural abnormality or extra-axial collection. Skull and upper cervical spine: The visualized skull base, calvarium, upper cervical spine and extracranial soft tissues are normal. Sinuses/Orbits: No fluid levels or advanced mucosal thickening. No  mastoid effusion. Normal orbits. MRA HEAD FINDINGS Intracranial internal carotid arteries: Normal. Anterior cerebral arteries: Normal. Middle cerebral arteries: Normal. Posterior communicating arteries: Absent bilaterally. Posterior cerebral arteries: Normal. Basilar artery: Normal. Vertebral arteries: Left dominant. Normal. Superior cerebellar arteries: Normal. Anterior inferior cerebellar arteries: Normal. Posterior inferior cerebellar arteries: Normal. IMPRESSION: 1. No acute intracranial abnormality or emergent large vessel occlusion. 2. Normal intracranial MRA. 3. 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 Otherwise normal brain for age. Electronically Signed   By: Ulyses Jarred M.D.   On: 05/20/2017 22:17   US Carotid Bilateral (at [facility] Only)  Result Date: 05/21/2017 CLINICAL DATA:  Near syncopal episode.  History of hyperlipidemia. EXAM: BILATERAL CAROTID DUPLEX ULTRASOUND TECHNIQUE: Pearline Cables scale imaging, color Doppler and duplex ultrasound were performed of bilateral carotid and vertebral arteries in the neck. COMPARISON:  None. FINDINGS: Criteria: Quantification of carotid stenosis is based on velocity parameters that correlate the residual internal carotid diameter with NASCET-based stenosis levels, using the diameter of the distal internal carotid lumen as the denominator for stenosis measurement. The following velocity measurements were obtained: RIGHT ICA:  64/17 cm/sec CCA:  63/01 cm/sec SYSTOLIC ICA/CCA RATIO:  0.8 DIASTOLIC ICA/CCA RATIO:  1.3 ECA:  109 cm/sec LEFT ICA:  53/11 cm/sec CCA:  60/10 cm/sec SYSTOLIC ICA/CCA RATIO:  0.7 DIASTOLIC ICA/CCA RATIO:  1.0 ECA:  77 cm/sec RIGHT CAROTID ARTERY: There is a minimal amount of echogenic atherosclerotic plaque within the right carotid bulb (images 15 and 16, not resulting in elevated peak systolic velocities within the interrogated course the right internal carotid artery to suggest a  hemodynamically significant stenosis. RIGHT VERTEBRAL ARTERY:  Antegrade flow LEFT CAROTID ARTERY: There is no grayscale evidence significant intimal thickening or atherosclerotic plaque affecting the interrogated portions of the left carotid system. There are no elevated peak systolic velocities within the interrogated course the left internal carotid artery to suggest a hemodynamically significant stenosis. LEFT VERTEBRAL ARTERY:  Antegrade Flow IMPRESSION: 1. Minimal amount of right-sided atherosclerotic plaque, not resulting in a hemodynamically significant stenosis. 2. Unremarkable sonographic evaluation of the left carotid system. Electronically Signed   By: Sandi Mariscal M.D.   On: 05/21/2017 11:52   Mr Jodene Nam Head/brain XN Cm  Result Date: 05/20/2017 CLINICAL DATA:  Near syncope. EXAM: MRI HEAD WITHOUT CONTRAST MRA HEAD  WITHOUT CONTRAST TECHNIQUE: Multiplanar, multiecho pulse sequences of the brain and surrounding structures were obtained without intravenous contrast. Angiographic images of the head were obtained using MRA technique without contrast. COMPARISON:  Head CT 05/20/2017 FINDINGS: MRI HEAD FINDINGS Brain: The midline structures are normal. No focal diffusion restriction to indicate acute infarct. No intraparenchymal hemorrhage. There is mild hyperintense T2-weighted signal within the periventricular white matter, most often seen in the setting of chronic microvascular ischemia. No mass lesion. No chronic microhemorrhage or cerebral amyloid angiopathy. No hydrocephalus, age advanced atrophy or lobar predominant volume loss. No dural abnormality or extra-axial collection. Skull and upper cervical spine: The visualized skull base, calvarium, upper cervical spine and extracranial soft tissues are normal. Sinuses/Orbits: No fluid levels or advanced mucosal thickening. No mastoid effusion. Normal orbits. MRA HEAD FINDINGS Intracranial internal carotid arteries: Normal. Anterior cerebral arteries: Normal.  Middle cerebral arteries: Normal. Posterior communicating arteries: Absent bilaterally. Posterior cerebral arteries: Normal. Basilar artery: Normal. Vertebral arteries: Left dominant. Normal. Superior cerebellar arteries: Normal. Anterior inferior cerebellar arteries: Normal. Posterior inferior cerebellar arteries: Normal. IMPRESSION: 1. No acute intracranial abnormality or emergent large vessel occlusion. 2. Normal intracranial MRA. 3. 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 Otherwise normal brain for age. Electronically Signed   By: Ulyses Jarred M.D.   On: 05/20/2017 22:17   Ct Head Code Stroke Wo Contrast  Result Date: 05/20/2017 CLINICAL DATA:  Code stroke. Sudden onset of syncopal episode with dizziness and head pressure, slurred speech. EXAM: CT HEAD WITHOUT CONTRAST TECHNIQUE: Contiguous axial images were obtained from the base of the skull through the vertex without intravenous contrast. COMPARISON:  None. FINDINGS: Brain: No evidence for acute infarction, hemorrhage, mass lesion, hydrocephalus, or extra-axial fluid. Normal for age cerebral volume. Hypoattenuation of white matter could represent small vessel disease. Vascular: Calcification of the intracranial vertebral arteries consistent with cerebrovascular atherosclerotic disease. No signs of intracranial large vessel occlusion. Skull: No fracture.  Incidental osteoma RIGHT frontal bone. Sinuses/Orbits: None. Other: Superficial scalp sebaceous cyst in the LEFT frontal region, incidental finding ASPECTS [facility] Stroke Program Early CT Score) - Ganglionic level infarction (caudate, lentiform nuclei, internal capsule, insula, M1-M3 cortex): 7 - Supraganglionic infarction (M4-M6 cortex): 3 Total score (0-10 with 10 being normal): 10 IMPRESSION: 1. No acute stroke.  Chronic changes as described. 2. ASPECTS is 10. These results were called by telephone at the time of interpretation on 05/20/2017 at 1:15  pm to Dr. Harvest Dark , who verbally acknowledged these results. Electronically Signed   By: Staci Righter M.D.   On: 05/20/2017 13:17      Management plans discussed with the patient, family and they are in agreement.  CODE STATUS:     Code Status Orders        Start     Ordered   05/20/17 1650  Full code  Continuous     05/20/17 1649    Code Status History    Date Active Date Inactive Code Status Order ID Comments User Context   This patient has a current code status but no historical code status.    Advance Directive Documentation     Most Recent Value  Type of Advance Directive  Living will  Pre-existing out of facility DNR order (yellow form or pink MOST form)  -  "MOST" Form in Place?  -      TOTAL TIME TAKING CARE OF THIS PATIENT: 40 minutes.    Henreitta Leber M.D on 05/21/2017 at 4:00 PM  Between 7am to 6pm - Pager - [phone]  After 6pm go to [email or web address] - Technical brewer [facility]  Office  [phone]  CC: Primary care physician; Kathrine Haddock, NP

## 2017-05-21 NOTE — Progress Notes (Signed)
OT Cancellation Note  Patient Details Name: Bethany Lara MRN: [id number] DOB: 12/30/1950   Cancelled Treatment:    Reason Eval/Treat Not Completed: Patient at procedure or test/ unavailable. Order received, chart reviewed. Pt with MD upon attempt. Will re-attempt OT evaluation later this morning as pt is available.  Jeni Salles, MPH, MS, OTR/L ascom [phone] 05/21/17, 9:03 AM

## 2017-05-21 NOTE — Progress Notes (Signed)
OT Screen Note  Patient Details Name: Bethany Lara MRN: [id number] DOB: 1950/11/18   Cancelled Treatment:    Reason Eval/Treat Not Completed: OT screened, no needs identified, will sign off. Pt presents at baseline. Indep with dressing, toileting, no sensory/strength deficits. Pt denies dizziness at this time. Pt denies substance abuse, stating "I wonder if there was something in the icing on my husband's birthday cake from the grocery store?" No skilled OT needs identified. Will sign off.   Jeni Salles, MPH, MS, OTR/L ascom [phone] 05/21/17, 9:39 AM

## 2017-05-21 NOTE — Progress Notes (Signed)
SLP Cancellation Note  Patient Details Name: Bethany Lara MRN: [id number] DOB: [birth year]   Cancelled treatment:       Reason Eval/Treat Not Completed: SLP screened, no needs identified, will sign off Pt denied any difficulty swallowing and is currently on a regular diet; tolerates swallowing pills w/ water per NSG. Pt conversed at conversational level w/out deficits noted; pt denied any speech-language deficits.  No further skilled ST services indicated as pt appears at her baseline. Pt agreed. NSG to reconsult if any change in status.    Orinda Kenner, MS, CCC-SLP Cecillia Menees 05/21/2017, 9:29 AM

## 2017-05-21 NOTE — Progress Notes (Addendum)
Patient alert and oriented, vss, no complaints of pain.  Removed telemetry and PIV.  Patient states she is missing a bra and underwear.  Patient did not have these items yesterday on admission.  She only had a blue/white house coat/gown on.  I called the charge nurse in the ED and gave information to my director Evette who said she would investigate the situation further.    Patient escorted out of hospital via wheelchair by volunteers.

## 2017-05-21 NOTE — Progress Notes (Signed)
PT Cancellation Note  Patient Details Name: Bethany Lara MRN: [id number] DOB: 1951-01-24   Cancelled Treatment:    Reason Eval/Treat Not Completed: Other (comment).  MD has discontinued PT order.  Pt was screened by OT.  PT has signed off.  If PT needs arise, please place new PT order.  Thank you.   Collie Siad PT, DPT 05/21/2017, 10:05 AM

## 2017-05-22 ENCOUNTER — Telehealth: Payer: Self-pay

## 2017-05-22 NOTE — Telephone Encounter (Signed)
I have made the 1st attempt to contact the patient or family member in charge, in order to follow up from recently being discharged from the hospital. I left a message on voicemail but I will make another attempt at a different time.  

## 2017-05-22 NOTE — Telephone Encounter (Signed)
Transition Care Management Follow-up Telephone Call  Date discharged? 05/21/2017  How have you been since you were released from the hospital? Not been feeling very well. Has a headache all morning, with some dizziness. Had patient take BP while on the phone: 157/81 HR 58. She has not tried taking her meclizine yet. Advised pt to take her meclizine as prescribed.   Do you understand why you were in the hospital? Yes  Do you understand the discharge instructions? Yes   Where were you discharged to? Home   Items Reviewed: Medications reviewed: YES Allergies reviewed: YES Dietary changes reviewed: YES Referrals reviewed: YES   Functional Questionnaire:  Activities of Daily Living (ADLs):   states they are independent in the following: feeding, bathing, ambulation  States they require assistance with the following: none  Any transportation issues/concerns?: no   Any patient concerns? no  Confirmed importance and date/time of follow-up visits scheduled: Yes Appointment: 05/24/2017 at 1:00pm with Wynona Dove Confirmed with patient if condition begins to worsen call PCP or go to the ER.  Patient was given the office number and encouraged to call back with question or concerns.  : YES

## 2017-05-24 ENCOUNTER — Encounter: Payer: Self-pay | Admitting: Family Medicine

## 2017-05-24 ENCOUNTER — Ambulatory Visit (INDEPENDENT_AMBULATORY_CARE_PROVIDER_SITE_OTHER): Payer: Medicare Other | Admitting: Family Medicine

## 2017-05-24 VITALS — BP 137/85 | HR 77 | Ht 61.0 in | Wt 184.8 lb

## 2017-05-24 DIAGNOSIS — J309 Allergic rhinitis, unspecified: Secondary | ICD-10-CM | POA: Diagnosis not present

## 2017-05-24 DIAGNOSIS — E876 Hypokalemia: Secondary | ICD-10-CM | POA: Diagnosis not present

## 2017-05-24 DIAGNOSIS — R42 Dizziness and giddiness: Secondary | ICD-10-CM | POA: Diagnosis not present

## 2017-05-24 MED ORDER — CETIRIZINE HCL 10 MG PO TABS: 10.0000 mg | ORAL_TABLET | Freq: Every day | ORAL | 11 refills | Status: DC

## 2017-05-24 MED ORDER — FLUTICASONE PROPIONATE 50 MCG/ACT NA SUSP
2.0000 | Freq: Two times a day (BID) | NASAL | 11 refills | Status: DC
Start: 2017-05-24 — End: 2018-01-24

## 2017-05-24 MED ORDER — MECLIZINE HCL 25 MG PO TABS: 25.0000 mg | ORAL_TABLET | Freq: Three times a day (TID) | ORAL | 0 refills | Status: DC | PRN

## 2017-05-24 NOTE — Progress Notes (Signed)
BP 137/85 (BP Location: Left Arm, Patient Position: Sitting, Cuff Size: Large)   Pulse 77   Ht 5\' 1"  (1.549 m)   Wt 184 lb 12.8 oz (83.8 kg)   LMP  (LMP Unknown)   SpO2 99%   BMI 34.92 kg/m    Subjective:    Patient ID: Bethany Lara, female    DOB: 10/29/1950, 66 y.o.   MRN: [id number]  HPI: Bethany Lara is a 66 y.o. female  Chief Complaint  Patient presents with  . Hospitalization Follow-up   Patient presents today for hospital f/u for vertigo. Underwent significant workup during admission to r/o CVA and others - all testing came back negative. Feeling much better after rest and meclizine. Hasn't needed her meclizine today. Has scheduled appt with ENT. Does note she has lingering ear pressure, rhinorrhea, and sinus pressure. Known hx of allergic rhinitis. Takes antihistamines prn.   Past Medical History:  Diagnosis Date  . Allergy   . Carpal tunnel syndrome   . Hyperlipidemia    Social History   Social History  . Marital status: Married    Spouse name: N/A  . Number of children: N/A  . Years of education: N/A   Occupational History  . Not on file.   Social History Main Topics  . Smoking status: Never Smoker  . Smokeless tobacco: Never Used  . Alcohol use No  . Drug use: No  . Sexual activity: Yes   Other Topics Concern  . Not on file   Social History Narrative  . No narrative on file   Relevant past medical, surgical, family and social history reviewed and updated as indicated. Interim medical history since our last visit reviewed. Allergies and medications reviewed and updated.  Review of Systems  Constitutional: Negative.   HENT: Positive for ear pain, postnasal drip, rhinorrhea and sinus pressure.   Eyes: Negative.   Respiratory: Negative.   Cardiovascular: Negative.   Gastrointestinal: Negative.   Genitourinary: Negative.   Musculoskeletal: Negative.   Neurological: Positive for dizziness.  Psychiatric/Behavioral: Negative.    Per  HPI unless specifically indicated above     Objective:    BP 137/85 (BP Location: Left Arm, Patient Position: Sitting, Cuff Size: Large)   Pulse 77   Ht 5\' 1"  (1.549 m)   Wt 184 lb 12.8 oz (83.8 kg)   LMP  (LMP Unknown)   SpO2 99%   BMI 34.92 kg/m   Wt Readings from Last 3 Encounters:  05/24/17 184 lb 12.8 oz (83.8 kg)  05/20/17 184 lb (83.5 kg)  01/21/17 182 lb 8 oz (82.8 kg)    Physical Exam  Constitutional: She is oriented to person, place, and time. She appears well-developed and well-nourished. No distress.  HENT:  Head: Atraumatic.  Eyes: Pupils are equal, round, and reactive to light. Conjunctivae are normal.  Neck: Normal range of motion. Neck supple.  Cardiovascular: Normal rate and normal heart sounds.   Pulmonary/Chest: Effort normal and breath sounds normal. No respiratory distress.  Musculoskeletal: Normal range of motion.  Neurological: She is alert and oriented to person, place, and time.  Skin: Skin is warm and dry.  Psychiatric: She has a normal mood and affect. Her behavior is normal.  Nursing note and vitals reviewed.  Results for orders placed or performed in visit on 02/58/52  Basic Metabolic Panel (BMET)  Result Value Ref Range   Glucose 81 65 - 99 mg/dL   BUN 10 8 - 27 mg/dL   Creatinine,  Ser 0.88 0.57 - 1.00 mg/dL   GFR calc non Af Amer 68 >59 mL/min/1.73   GFR calc Af Amer 78 >59 mL/min/1.73   BUN/Creatinine Ratio 11 (L) 12 - 28   Sodium 141 134 - 144 mmol/L   Potassium 4.2 3.5 - 5.2 mmol/L   Chloride 104 96 - 106 mmol/L   CO2 22 20 - 29 mmol/L   Calcium 9.6 8.7 - 10.3 mg/dL      Assessment & Plan:   Problem List Items Addressed This Visit      Respiratory   Allergic rhinitis    Discussed importance of consistent allergy regimen. Will start daily flonase and zyrtec. Can also take sudafed x several days to help relieve ear pressure, which is possibly triggering her vertigo.         Other   Hypokalemia    Noted during admission,  potassium had been stable previously without supplementation. Repleted during hospital stay. Will recheck BMP today      Relevant Orders   Basic Metabolic Panel (BMET) (Completed)   Vertigo - Primary       Follow up plan: Return for as scheduled.

## 2017-05-25 LAB — BASIC METABOLIC PANEL
BUN / CREAT RATIO: 11 — AB (ref 12–28)
BUN: 10 mg/dL (ref 8–27)
CHLORIDE: 104 mmol/L (ref 96–106)
CO2: 22 mmol/L (ref 20–29)
Calcium: 9.6 mg/dL (ref 8.7–10.3)
Creatinine, Ser: 0.88 mg/dL (ref 0.57–1.00)
GFR calc Af Amer: 78 mL/min/{1.73_m2} (ref >59)
GFR calc non Af Amer: 68 mL/min/{1.73_m2} (ref >59)
GLUCOSE: 81 mg/dL (ref 65–99)
Potassium: 4.2 mmol/L (ref 3.5–5.2)
SODIUM: 141 mmol/L (ref 134–144)

## 2017-05-27 NOTE — Assessment & Plan Note (Signed)
Discussed importance of consistent allergy regimen. Will start daily flonase and zyrtec. Can also take sudafed x several days to help relieve ear pressure, which is possibly triggering her vertigo.

## 2017-05-27 NOTE — Patient Instructions (Signed)
Follow up as needed

## 2017-05-27 NOTE — Assessment & Plan Note (Signed)
Noted during admission, potassium had been stable previously without supplementation. Repleted during hospital stay. Will recheck BMP today

## 2017-06-12 ENCOUNTER — Ambulatory Visit
Admission: RE | Admit: 2017-06-12 | Discharge: 2017-06-12 | Disposition: A | Discharge status: Home / Self Care | Payer: Medicare Other | Source: Ambulatory Visit | Attending: Family Medicine | Admitting: Family Medicine

## 2017-06-12 ENCOUNTER — Encounter: Payer: Self-pay | Admitting: Family Medicine

## 2017-06-12 ENCOUNTER — Ambulatory Visit (INDEPENDENT_AMBULATORY_CARE_PROVIDER_SITE_OTHER): Payer: Medicare Other | Admitting: Family Medicine

## 2017-06-12 VITALS — BP 127/73 | HR 63 | Temp 97.8°F | Wt 183.0 lb

## 2017-06-12 DIAGNOSIS — M79644 Pain in right finger(s): Secondary | ICD-10-CM

## 2017-06-12 NOTE — Progress Notes (Signed)
   BP 127/73   Pulse 63   Temp 97.8 F (36.6 C)   Wt 183 lb (83 kg)   LMP  (LMP Unknown)   SpO2 99%   BMI 34.58 kg/m    Subjective:    Patient ID: Bethany Lara, female    DOB: 1950-12-07, 66 y.o.   MRN: [id number]  HPI: Bethany Lara is a 66 y.o. female  Chief Complaint  Patient presents with  . Thumb Pain    right thumb, slammed in a door. 2 days ago. Pain and swelling is improving.   Patient presents with right thumb pain since slamming it onto a door frame 2 days ago when her dog pulled her. Has had swelling and throbbing pain since, which seems a good bit better today. Able to move the joint. Pain under decent control with OTC pain relievers. Has been using ice and resting the arm as well.   Relevant past medical, surgical, family and social history reviewed and updated as indicated. Interim medical history since our last visit reviewed. Allergies and medications reviewed and updated.  Review of Systems  Constitutional: Negative.   HENT: Negative.   Respiratory: Negative.   Gastrointestinal: Negative.   Musculoskeletal: Positive for arthralgias.  Neurological: Negative.   Psychiatric/Behavioral: Negative.    Per HPI unless specifically indicated above     Objective:    BP 127/73   Pulse 63   Temp 97.8 F (36.6 C)   Wt 183 lb (83 kg)   LMP  (LMP Unknown)   SpO2 99%   BMI 34.58 kg/m   Wt Readings from Last 3 Encounters:  06/12/17 183 lb (83 kg)  05/24/17 184 lb 12.8 oz (83.8 kg)  05/20/17 184 lb (83.5 kg)    Physical Exam  Constitutional: She is oriented to person, place, and time. She appears well-developed and well-nourished. No distress.  Cardiovascular: Normal rate and normal heart sounds.   Pulmonary/Chest: Effort normal and breath sounds normal. No respiratory distress.  Musculoskeletal: Normal range of motion. She exhibits tenderness (TTP first DIP joint right thumb). She exhibits no edema.  Good ROM in right thumb, some hesitation d/t  discomfort but has strength and ROM  Neurological: She is alert and oriented to person, place, and time. She exhibits normal muscle tone.  Neurovascularly intact b/l UEs  Skin: Skin is warm and dry. No erythema. No pallor.  No bruising or abrasions noted  Psychiatric: She has a normal mood and affect. Her behavior is normal.  Nursing note and vitals reviewed.     Assessment & Plan:   Problem List Items Addressed This Visit    None    Visit Diagnoses    Pain of right thumb    -  Primary   Will get x-ray to r/o fx, but per pt sxs are resolving with conservative measures. Splint finger, ice, rest, continue OTC pain relievers prn   Relevant Orders   DG Finger Thumb Right (Completed)       Follow up plan: Return for as scheduled.

## 2017-06-13 ENCOUNTER — Telehealth: Payer: Self-pay | Admitting: Family Medicine

## 2017-06-13 NOTE — Telephone Encounter (Signed)
Left message to call.

## 2017-06-13 NOTE — Telephone Encounter (Signed)
X-ray showed a possible fracture fragment in her thumb, but if she's improving still we can just brace it for a few weeks with a splint and ice and then get a follow-up x-ray. If not improving, we can send her to orthopedics

## 2017-06-14 NOTE — Patient Instructions (Signed)
Follow up as needed

## 2017-06-14 NOTE — Telephone Encounter (Signed)
Left message to call.

## 2017-06-18 NOTE — Telephone Encounter (Signed)
Patent notified !

## 2017-07-17 ENCOUNTER — Ambulatory Visit (INDEPENDENT_AMBULATORY_CARE_PROVIDER_SITE_OTHER): Payer: Medicare Other

## 2017-07-17 VITALS — BP 136/81 | HR 61 | Temp 97.2°F | Resp 16 | Ht 61.0 in | Wt 186.7 lb

## 2017-07-17 DIAGNOSIS — Z0000 Encounter for general adult medical examination without abnormal findings: Secondary | ICD-10-CM | POA: Diagnosis not present

## 2017-07-17 NOTE — Patient Instructions (Addendum)
[name] , Thank you for taking time to come for your Medicare Wellness Visit. I appreciate your ongoing commitment to your health goals. Please review the following plan we discussed and let me know if I can assist you in the future.   Screening recommendations/referrals: Colonoscopy: cologuard completed 08/2016 Mammogram: completed 04/09/2017 Bone Density: completed 09/09/2014 Recommended yearly ophthalmology/optometry visit for glaucoma screening and checkup Recommended yearly dental visit for hygiene and checkup  Vaccinations: Influenza vaccine: due, declined  Pneumococcal vaccine: up to date Tdap vaccine: due now, check with your insurance company for coverage Shingles vaccine: up to date  Advanced directives: Please bring a copy of your health care power of attorney and living will to the office at your convenience.  Conditions/risks identified: Recommend drinking at least 7-8 glasses of water a day   Next appointment: Follow up  On 07/24/2017 at 8:00am with Bethany Lara. Follow up in one year for your annual wellness exam.   Preventive Care 65 Years and Older, Female Preventive care refers to lifestyle choices and visits with your health care provider that can promote health and wellness. What does preventive care include?  A yearly physical exam. This is also called an annual well check.  Dental exams once or twice a year.  Routine eye exams. Ask your health care provider how often you should have your eyes checked.  Personal lifestyle choices, including:  Daily care of your teeth and gums.  Regular physical activity.  Eating a healthy diet.  Avoiding tobacco and drug use.  Limiting alcohol use.  Practicing safe sex.  Taking low-dose aspirin every day.  Taking vitamin and mineral supplements as recommended by your health care provider. What happens during an annual well check? The services and screenings done by your health care provider during your  annual well check will depend on your age, overall health, lifestyle risk factors, and family history of disease. Counseling  Your health care provider may ask you questions about your:  Alcohol use.  Tobacco use.  Drug use.  Emotional well-being.  Home and relationship well-being.  Sexual activity.  Eating habits.  History of falls.  Memory and ability to understand (cognition).  Work and work Statistician.  Reproductive health. Screening  You may have the following tests or measurements:  Height, weight, and BMI.  Blood pressure.  Lipid and cholesterol levels. These may be checked every 5 years, or more frequently if you are over 16 years old.  Skin check.  Lung cancer screening. You may have this screening every year starting at age 24 if you have a 30-pack-year history of smoking and currently smoke or have quit within the past 15 years.  Fecal occult blood test (FOBT) of the stool. You may have this test every year starting at age 39.  Flexible sigmoidoscopy or colonoscopy. You may have a sigmoidoscopy every 5 years or a colonoscopy every 10 years starting at age 22.  Hepatitis C blood test.  Hepatitis B blood test.  Sexually transmitted disease (STD) testing.  Diabetes screening. This is done by checking your blood sugar (glucose) after you have not eaten for a while (fasting). You may have this done every 1-3 years.  Bone density scan. This is done to screen for osteoporosis. You may have this done starting at age 80.  Mammogram. This may be done every 1-2 years. Talk to your health care provider about how often you should have regular mammograms. Talk with your health care provider about your test results, treatment  options, and if necessary, the need for more tests. Vaccines  Your health care provider may recommend certain vaccines, such as:  Influenza vaccine. This is recommended every year.  Tetanus, diphtheria, and acellular pertussis (Tdap, Td)  vaccine. You may need a Td booster every 10 years.  Zoster vaccine. You may need this after age 29.  Pneumococcal 13-valent conjugate (PCV13) vaccine. One dose is recommended after age 84.  Pneumococcal polysaccharide (PPSV23) vaccine. One dose is recommended after age 23. Talk to your health care provider about which screenings and vaccines you need and how often you need them. This information is not intended to replace advice given to you by your health care provider. Make sure you discuss any questions you have with your health care provider. Document Released: 10/07/2015 Document Revised: 05/30/2016 Document Reviewed: 07/12/2015 Elsevier Interactive Patient Education  2017 [place] Prevention in the Home Falls can cause injuries. They can happen to people of all ages. There are many things you can do to make your home safe and to help prevent falls. What can I do on the outside of my home?  Regularly fix the edges of walkways and driveways and fix any cracks.  Remove anything that might make you trip as you walk through a door, such as a raised step or threshold.  Trim any bushes or trees on the path to your home.  Use bright outdoor lighting.  Clear any walking paths of anything that might make someone trip, such as rocks or tools.  Regularly check to see if handrails are loose or broken. Make sure that both sides of any steps have handrails.  Any raised decks and porches should have guardrails on the edges.  Have any leaves, snow, or ice cleared regularly.  Use sand or salt on walking paths during winter.  Clean up any spills in your garage right away. This includes oil or grease spills. What can I do in the bathroom?  Use night lights.  Install grab bars by the toilet and in the tub and shower. Do not use towel bars as grab bars.  Use non-skid mats or decals in the tub or shower.  If you need to sit down in the shower, use a plastic, non-slip  stool.  Keep the floor dry. Clean up any water that spills on the floor as soon as it happens.  Remove soap buildup in the tub or shower regularly.  Attach bath mats securely with double-sided non-slip rug tape.  Do not have throw rugs and other things on the floor that can make you trip. What can I do in the bedroom?  Use night lights.  Make sure that you have a light by your bed that is easy to reach.  Do not use any sheets or blankets that are too big for your bed. They should not hang down onto the floor.  Have a firm chair that has side arms. You can use this for support while you get dressed.  Do not have throw rugs and other things on the floor that can make you trip. What can I do in the kitchen?  Clean up any spills right away.  Avoid walking on wet floors.  Keep items that you use a lot in easy-to-reach places.  If you need to reach something above you, use a strong step stool that has a grab bar.  Keep electrical cords out of the way.  Do not use floor polish or wax that makes floors slippery.  If you must use wax, use non-skid floor wax.  Do not have throw rugs and other things on the floor that can make you trip. What can I do with my stairs?  Do not leave any items on the stairs.  Make sure that there are handrails on both sides of the stairs and use them. Fix handrails that are broken or loose. Make sure that handrails are as long as the stairways.  Check any carpeting to make sure that it is firmly attached to the stairs. Fix any carpet that is loose or worn.  Avoid having throw rugs at the top or bottom of the stairs. If you do have throw rugs, attach them to the floor with carpet tape.  Make sure that you have a light switch at the top of the stairs and the bottom of the stairs. If you do not have them, ask someone to add them for you. What else can I do to help prevent falls?  Wear shoes that:  Do not have high heels.  Have rubber bottoms.  Are  comfortable and fit you well.  Are closed at the toe. Do not wear sandals.  If you use a stepladder:  Make sure that it is fully opened. Do not climb a closed stepladder.  Make sure that both sides of the stepladder are locked into place.  Ask someone to hold it for you, if possible.  Clearly mark and make sure that you can see:  Any grab bars or handrails.  First and last steps.  Where the edge of each step is.  Use tools that help you move around (mobility aids) if they are needed. These include:  Canes.  Walkers.  Scooters.  Crutches.  Turn on the lights when you go into a dark area. Replace any light bulbs as soon as they burn out.  Set up your furniture so you have a clear path. Avoid moving your furniture around.  If any of your floors are uneven, fix them.  If there are any pets around you, be aware of where they are.  Review your medicines with your doctor. Some medicines can make you feel dizzy. This can increase your chance of falling. Ask your doctor what other things that you can do to help prevent falls. This information is not intended to replace advice given to you by your health care provider. Make sure you discuss any questions you have with your health care provider. Document Released: 07/07/2009 Document Revised: 02/16/2016 Document Reviewed: 10/15/2014 Elsevier Interactive Patient Education  2017 [employer].

## 2017-07-17 NOTE — Progress Notes (Signed)
Subjective:   Bethany Lara is a 66 y.o. female who presents for Medicare Annual (Subsequent) preventive examination.  Review of Systems:  Cardiac Risk Factors include: advanced age (>34men, >39 women);dyslipidemia;obesity (BMI >30kg/m2)     Objective:     Vitals: BP 136/81 (BP Location: Left Arm, Patient Position: Sitting)   Pulse 61   Temp (!) 97.2 F (36.2 C) (Oral)   Resp 16   Ht 5\' 1"  (1.549 m)   Wt 186 lb 11.2 oz (84.7 kg)   LMP  (LMP Unknown)   BMI 35.28 kg/m   Body mass index is 35.28 kg/m.   Tobacco History  Smoking Status  . Never Smoker  Smokeless Tobacco  . Never Used     Counseling given: Not Answered   Past Medical History:  Diagnosis Date  . Allergy   . Carpal tunnel syndrome   . Hyperlipidemia    Past Surgical History:  Procedure Laterality Date  . BUNIONECTOMY    . CARPAL TUNNEL RELEASE  11/14/2015  . CHOLECYSTECTOMY     Family History  Problem Relation Age of Onset  . Diabetes Mother   . Hypertension Mother   . Dementia Mother   . Diabetes Father   . Heart disease Father   . Alzheimer's disease Father   . Glaucoma Father   . Colon cancer Father   . Breast cancer Maternal Aunt    History  Sexual Activity  . Sexual activity: Yes    Outpatient Encounter Prescriptions as of 07/17/2017  Medication Sig  . aspirin 81 MG tablet Take 81 mg by mouth daily.  . cetirizine (ZYRTEC) 10 MG tablet Take 1 tablet (10 mg total) by mouth daily.  . fenofibrate 160 MG tablet Take 1 tablet (160 mg total) by mouth daily.  . fluticasone (FLONASE) 50 MCG/ACT nasal spray Place 2 sprays into both nostrils 2 (two) times daily.  . hydrOXYzine (VISTARIL) 50 MG capsule Take 1 capsule (50 mg total) by mouth 3 (three) times daily as needed.  . meclizine (ANTIVERT) 25 MG tablet Take 1 tablet (25 mg total) by mouth 3 (three) times daily as needed for dizziness.  . Multiple Vitamin (MULTIVITAMIN) tablet Take 1 tablet by mouth daily.  . pravastatin  (PRAVACHOL) 40 MG tablet Take 1 tablet (40 mg total) by mouth at bedtime.  Marland Kitchen VITAMIN D, CHOLECALCIFEROL, PO Take 2,000 Units by mouth daily.   No facility-administered encounter medications on file as of 07/17/2017.     Activities of Daily Living In your present state of health, do you have any difficulty performing the following activities: 07/17/2017 05/20/2017  Hearing? N N  Vision? N N  Difficulty concentrating or making decisions? N N  Walking or climbing stairs? N N  Dressing or bathing? N N  Doing errands, shopping? N N  Preparing Food and eating ? N -  Using the Toilet? N -  In the past six months, have you accidently leaked urine? N -  Do you have problems with loss of bowel control? N -  Managing your Medications? N -  Managing your Finances? N -  Housekeeping or managing your Housekeeping? N -  Some recent data might be hidden    Patient Care Team: Kathrine Haddock, NP as PCP - General (Nurse Practitioner) Kathrine Haddock, NP as PCP - Family Medicine (Nurse Practitioner)    Assessment:     Exercise Activities and Dietary recommendations Current Exercise Habits: Home exercise routine, Type of exercise: walking, Time (Minutes): 30, Frequency (  Times/Week): 2, Weekly Exercise (Minutes/Week): 60, Intensity: Mild, Exercise limited by: None identified  Goals    . Increase water intake          Recommend drinking at least 7-8 glasses of water a day       Fall Risk Fall Risk  07/17/2017 07/23/2016 07/22/2015 07/22/2015  Falls in the past year? No No No No   Depression Screen PHQ 2/9 Scores 07/17/2017 07/23/2016 07/22/2015  PHQ - 2 Score 0 2 0  PHQ- 9 Score 1 - -     Cognitive Function     6CIT Screen 07/17/2017  What Year? 0 points  What month? 0 points  What time? 0 points  Count back from 20 0 points  Months in reverse 0 points  Repeat phrase 2 points  Total Score 2    Immunization History  Administered Date(s) Administered  . Influenza, High Dose  Seasonal PF 07/23/2016  . Influenza,inj,Quad PF,6+ Mos 11/28/2015  . Pneumococcal Conjugate-13 07/13/2014  . Pneumococcal Polysaccharide-23 01/20/2016  . Td 11/20/2005  . Zoster 05/09/2010   Screening Tests Health Maintenance  Topic Date Due  . TETANUS/TDAP  01/21/2018 (Originally 11/21/2015)  . INFLUENZA VACCINE  06/24/2018 (Originally 04/24/2017)  . MAMMOGRAM  04/10/2019  . COLONOSCOPY  09/11/2019  . DEXA SCAN  Completed  . Hepatitis C Screening  Completed  . PNA vac Low Risk Adult  Completed      Plan:     I have personally reviewed and addressed the Medicare Annual Wellness questionnaire and have noted the following in the patient's chart:  A. Medical and social history B. Use of alcohol, tobacco or illicit drugs  C. Current medications and supplements D. Functional ability and status E.  Nutritional status F.  Physical activity G. Advance directives H. List of other physicians I.  Hospitalizations, surgeries, and ER visits in previous 12 months J.  [place] such as hearing and vision if needed, cognitive and depression L. Referrals and appointments   In addition, I have reviewed and discussed with patient certain preventive protocols, quality metrics, and best practice recommendations. A written personalized care plan for preventive services as well as general preventive health recommendations were provided to patient.   Signed,  Tyler Aas, LPN Nurse Health Advisor   MD Recommendations: Patient complains of sinus pressure and sore throat at today's visit. No availabilities for today, will schedule appointment for tomorrow with Wynona Dove

## 2017-07-18 ENCOUNTER — Emergency Department
Admission: EM | Admit: 2017-07-18 | Discharge: 2017-07-18 | Disposition: A | Discharge status: Home / Self Care | Payer: Medicare Other | Attending: Emergency Medicine | Admitting: Emergency Medicine

## 2017-07-18 ENCOUNTER — Ambulatory Visit (INDEPENDENT_AMBULATORY_CARE_PROVIDER_SITE_OTHER): Payer: Medicare Other | Admitting: Family Medicine

## 2017-07-18 ENCOUNTER — Emergency Department: Payer: Medicare Other

## 2017-07-18 ENCOUNTER — Encounter: Payer: Self-pay | Admitting: Family Medicine

## 2017-07-18 VITALS — BP 130/79 | HR 67 | Temp 98.1°F | Wt 185.8 lb

## 2017-07-18 DIAGNOSIS — J0120 Acute ethmoidal sinusitis, unspecified: Secondary | ICD-10-CM

## 2017-07-18 DIAGNOSIS — M25561 Pain in right knee: Secondary | ICD-10-CM | POA: Insufficient documentation

## 2017-07-18 MED ORDER — TRAMADOL HCL 50 MG PO TABS: 50.0000 mg | ORAL_TABLET | Freq: Four times a day (QID) | ORAL | 0 refills | Status: AC | PRN

## 2017-07-18 MED ORDER — AMOXICILLIN-POT CLAVULANATE 875-125 MG PO TABS: 1.0000 | ORAL_TABLET | Freq: Two times a day (BID) | ORAL | 0 refills | Status: DC

## 2017-07-18 MED ORDER — MELOXICAM 15 MG PO TABS: 15.0000 mg | ORAL_TABLET | Freq: Every day | ORAL | 1 refills | Status: AC

## 2017-07-18 MED ORDER — HYDROCOD POLST-CPM POLST ER 10-8 MG/5ML PO SUER: 5.0000 mL | Freq: Two times a day (BID) | ORAL | 0 refills | Status: DC | PRN

## 2017-07-18 NOTE — ED Provider Notes (Signed)
[facility] Emergency Department Provider Note  ____________________________________________  Time seen: Approximately 11:20 PM  I have reviewed the triage vital signs and the nursing notes.   HISTORY  Chief Complaint Fall and Knee Injury (right)    HPI Bethany Lara is a 66 y.o. female presenting to the emergency department with 10 out of 10 right knee pain after patient fell tonight in her house.  Patient reports that she tripped over a barricade between her kitchen and living room.  Patient has been unable to bear weight since the incident.  Patient has noticed ecchymosis of the right knee as well as knee edema.  She denies radiculopathy, weakness or changes in sensation of the lower extremities.  No alleviating measures were attempted prior to presenting to the emergency department.   Past Medical History:  Diagnosis Date  . Allergy   . Carpal tunnel syndrome   . Hyperlipidemia     Patient Active Problem List   Diagnosis Date Noted  . Near syncope 05/20/2017  . Bradycardia 05/20/2017  . Hypokalemia 05/20/2017  . Vertigo 05/20/2017  . Chronic kidney disease, stage 3 ([place]) 01/21/2017  . Metabolic syndrome 09/81/1914  . IFG (impaired fasting glucose) 01/20/2016  . Eczema 07/22/2015  . Hyperlipidemia 07/15/2015  . Allergic rhinitis 07/15/2015  . Carpal tunnel syndrome 07/15/2015    Past Surgical History:  Procedure Laterality Date  . BUNIONECTOMY    . CARPAL TUNNEL RELEASE  11/14/2015  . CHOLECYSTECTOMY      Prior to Admission medications   Medication Sig Start Date End Date Taking? Authorizing Provider  amoxicillin-clavulanate (AUGMENTIN) 875-125 MG tablet Take 1 tablet by mouth 2 (two) times daily. 07/18/17   Volney American, PA-C  aspirin 81 MG tablet Take 81 mg by mouth daily.    [provider]  cetirizine (ZYRTEC) 10 MG tablet Take 1 tablet (10 mg total) by mouth daily. 05/24/17   Volney American, PA-C   chlorpheniramine-HYDROcodone [facility] ER) 10-8 MG/5ML SUER Take 5 mLs by mouth every 12 (twelve) hours as needed for cough. 07/18/17   Volney American, PA-C  fenofibrate 160 MG tablet Take 1 tablet (160 mg total) by mouth daily. 01/21/17   Kathrine Haddock, NP  fluticasone (FLONASE) 50 MCG/ACT nasal spray Place 2 sprays into both nostrils 2 (two) times daily. 05/24/17   Volney American, PA-C  hydrOXYzine (VISTARIL) 50 MG capsule Take 1 capsule (50 mg total) by mouth 3 (three) times daily as needed. 06/20/16   Volney American, PA-C  meclizine (ANTIVERT) 25 MG tablet Take 1 tablet (25 mg total) by mouth 3 (three) times daily as needed for dizziness. 05/24/17   Volney American, PA-C  meloxicam (MOBIC) 15 MG tablet Take 1 tablet (15 mg total) by mouth daily. 07/18/17 07/28/17  Lannie Fields, PA-C  Multiple Vitamin (MULTIVITAMIN) tablet Take 1 tablet by mouth daily.    [provider]  pravastatin (PRAVACHOL) 40 MG tablet Take 1 tablet (40 mg total) by mouth at bedtime. 07/23/16   Kathrine Haddock, NP  traMADol (ULTRAM) 50 MG tablet Take 1 tablet (50 mg total) by mouth every 6 (six) hours as needed. 07/18/17 07/21/17  Lannie Fields, PA-C  VITAMIN D, CHOLECALCIFEROL, PO Take 2,000 Units by mouth daily.    [provider]    Allergies Zocor [simvastatin]  Family History  Problem Relation Age of Onset  . Diabetes Mother   . Hypertension Mother   . Dementia Mother   .  Diabetes Father   . Heart disease Father   . Alzheimer's disease Father   . Glaucoma Father   . Colon cancer Father   . Breast cancer Maternal Aunt     Social History Social History  Substance Use Topics  . Smoking status: Never Smoker  . Smokeless tobacco: Never Used  . Alcohol use No     Review of Systems  Constitutional: No fever/chills Eyes: No visual changes. No discharge ENT: No upper respiratory complaints. Cardiovascular: no chest pain. Respiratory: no  cough. No SOB. Musculoskeletal: Patient has right knee pain Skin: Negative for rash, abrasions, lacerations, ecchymosis. Neurological: Negative for headaches, focal weakness or numbness.   ____________________________________________   PHYSICAL EXAM:  VITAL SIGNS: ED Triage Vitals  Enc Vitals Group     BP 07/18/17 1915 135/81     Pulse Rate 07/18/17 1915 80     Resp 07/18/17 1915 17     Temp 07/18/17 1915 98 F (36.7 C)     Temp Source 07/18/17 1915 Oral     SpO2 07/18/17 1915 95 %     Weight 07/18/17 1912 185 lb (83.9 kg)     Height 07/18/17 1912 5\' 1"  (1.549 m)     Head Circumference --      Peak Flow --      Pain Score 07/18/17 1912 2     Pain Loc --      Pain Edu? --      Excl. in [place]? --      Constitutional: Alert and oriented. Well appearing and in no acute distress. Eyes: Conjunctivae are normal. PERRL. EOMI. Head: Atraumatic. Cardiovascular: Normal rate, regular rhythm. Normal S1 and S2.  Good peripheral circulation. Respiratory: Normal respiratory effort without tachypnea or retractions. Lungs CTAB. Good air entry to the bases with no decreased or absent breath sounds. Musculoskeletal: Patient is able to perform limited range of motion at the right knee, likely secondary to pain.  Patient is able to perform a straight leg raise test.  Negative anterior and posterior drawer test, right.  No laxity with MCL or LCL testing, right.  Positive ballottement.  Negative apprehension, right. Palpable dorsalis pedis pulse bilaterally and symmetrically. Neurologic:  Normal speech and language. No gross focal neurologic deficits are appreciated.  Skin:  Skin is warm, dry and intact. No rash noted. Psychiatric: Mood and affect are normal. Speech and behavior are normal. Patient exhibits appropriate insight and judgement.   ____________________________________________   LABS (all labs ordered are listed, but only abnormal results are displayed)  Labs Reviewed - No data to  display ____________________________________________  EKG   ____________________________________________  RADIOLOGY Unk Pinto, personally viewed and evaluated these images (plain radiographs) as part of my medical decision making, as well as reviewing the written report by the radiologist.   Dg Knee Complete 4 Views Right  Result Date: 07/18/2017 CLINICAL DATA:  66 year old female with fall and right knee pain. EXAM: RIGHT KNEE - COMPLETE 4+ VIEW COMPARISON:  None. FINDINGS: There is no acute fracture or dislocation. There is mild osteopenia with narrowing of the medial compartment and spurring. Trace suprapatellar effusion noted. There is diffuse subcutaneous edema over the knee likely representing contusion. IMPRESSION: 1. No acute fracture or dislocation. 2. Mild arthritic changes. 3. Minimal suprapatellar effusion and subcutaneous soft tissue contusion over the knee. Electronically Signed   By: Anner Crete M.D.   On: 07/18/2017 19:56    ____________________________________________    PROCEDURES  Procedure(s) performed:  Procedures    Medications - No data to display   ____________________________________________   INITIAL IMPRESSION / ASSESSMENT AND PLAN / ED COURSE  Pertinent labs & imaging results that were available during my care of the patient were reviewed by me and considered in my medical decision making (see chart for details).  Review of the [place] CSRS was performed in accordance of the [place] prior to dispensing any controlled drugs.    Assessment and plan Right knee pain Differential diagnosis includes ACL tear versus MCL sprain versus meniscal tear versus fracture versus knee contusion.  Patient presents to the emergency department after falling on her right knee this evening at her house.  X-ray examination reveals no acute fractures or bony abnormalities.  Patient had no laxity with ACL, PCL, MCL or LCL testing.  Knee contusion is likely.   Patient was advised to follow-up with orthopedics.  She was discharged with Meloxicam and tramadol.  Vital signs are reassuring prior to discharge.  All patient questions were answered.  ____________________________________________  FINAL CLINICAL IMPRESSION(S) / ED DIAGNOSES  Final diagnoses:  Acute pain of right knee      NEW MEDICATIONS STARTED DURING THIS VISIT:  Discharge Medication List as of 07/18/2017  8:38 PM    START taking these medications   Details  meloxicam (MOBIC) 15 MG tablet Take 1 tablet (15 mg total) by mouth daily., Starting Thu 07/18/2017, Until Sun 07/28/2017, Print            This chart was dictated using voice recognition software/Dragon. Despite best efforts to proofread, errors can occur which can change the meaning. Any change was purely unintentional.    Lannie Fields, PA-C 07/18/17 2326    Nance Pear, MD 07/18/17 [phone]

## 2017-07-18 NOTE — Patient Instructions (Signed)
Follow up as needed

## 2017-07-18 NOTE — ED Notes (Signed)
Pt given demonstration on how to use crutches, pt able to use crutches at this time. Pt notified use of crutches will continue to improve with its continued use and to follow up with orthopedic dr. Pt verbalized understanding of this.

## 2017-07-18 NOTE — ED Notes (Signed)

## 2017-07-18 NOTE — ED Triage Notes (Signed)
Patient reports fall at 1600 today. Patient c/o right knee pain since fall. Patient unable to ambulate since fall. Visible swelling/bruisng to area.

## 2017-07-18 NOTE — Progress Notes (Signed)
   BP 130/79   Pulse 67   Temp 98.1 F (36.7 C)   Wt 185 lb 12.8 oz (84.3 kg)   LMP  (LMP Unknown)   SpO2 97%   BMI 35.11 kg/m    Subjective:    Patient ID: Bethany Lara, female    DOB: [birth year], 66 y.o.   MRN: [id number]  HPI: Bethany Lara is a 66 y.o. female  Chief Complaint  Patient presents with  . URI    pt states she has had a headache, toothache, ear ache, cough, sinus pressure, and congestion since Sunday    5-6 day hx of sore throat, productive cough, headache, toothache, ear pain, and sinus pain and pressure. Right side worse than left. Denies fevers, SOB, wheezing. Taking tylenol cold and sinus as well as taking daily flonase and zyrtec with some relief.   Relevant past medical, surgical, family and social history reviewed and updated as indicated. Interim medical history since our last visit reviewed. Allergies and medications reviewed and updated.  Review of Systems  Constitutional: Negative.   HENT: Positive for congestion, dental problem (tooth pain), ear pain, sinus pain, sinus pressure and sore throat.   Eyes: Negative.   Respiratory: Positive for cough.   Cardiovascular: Negative.   Gastrointestinal: Negative.   Genitourinary: Negative.   Musculoskeletal: Negative.   Skin: Negative.   Neurological: Positive for headaches.  Psychiatric/Behavioral: Negative.    Per HPI unless specifically indicated above     Objective:    BP 130/79   Pulse 67   Temp 98.1 F (36.7 C)   Wt 185 lb 12.8 oz (84.3 kg)   LMP  (LMP Unknown)   SpO2 97%   BMI 35.11 kg/m   Wt Readings from Last 3 Encounters:  07/18/17 185 lb 12.8 oz (84.3 kg)  07/17/17 186 lb 11.2 oz (84.7 kg)  06/12/17 183 lb (83 kg)    Physical Exam  Constitutional: She is oriented to person, place, and time. She appears well-developed and well-nourished. No distress.  HENT:  Head: Atraumatic.  Right Ear: External ear normal.  Left Ear: External ear normal.  Nose: Nose normal.    Mouth/Throat: No oropharyngeal exudate.  Oropharynx erythmatous B/l ethmoidal sinus ttp  Eyes: Pupils are equal, round, and reactive to light. Conjunctivae are normal. No scleral icterus.  Neck: Normal range of motion. Neck supple.  Cardiovascular: Normal rate and normal heart sounds.   Pulmonary/Chest: Effort normal and breath sounds normal. No respiratory distress.  Musculoskeletal: Normal range of motion.  Neurological: She is alert and oriented to person, place, and time.  Skin: Skin is warm and dry.  Psychiatric: She has a normal mood and affect. Her behavior is normal.  Nursing note and vitals reviewed.     Assessment & Plan:   Problem List Items Addressed This Visit    None    Visit Diagnoses    Acute ethmoidal sinusitis, recurrence not specified    -  Primary   Discussed sinus rinses, humidifier, continue OTC cold medications. If no improvement over next few days, can start augmentin additionally   Relevant Medications   amoxicillin-clavulanate (AUGMENTIN) 875-125 MG tablet   chlorpheniramine-HYDROcodone (TUSSIONEX PENNKINETIC ER) 10-8 MG/5ML SUER       Follow up plan: Return if symptoms worsen or fail to improve.

## 2017-07-24 ENCOUNTER — Ambulatory Visit (INDEPENDENT_AMBULATORY_CARE_PROVIDER_SITE_OTHER): Payer: Medicare Other | Admitting: Unknown Physician Specialty

## 2017-07-24 ENCOUNTER — Encounter: Payer: Self-pay | Admitting: Unknown Physician Specialty

## 2017-07-24 VITALS — BP 125/73 | HR 61 | Temp 97.6°F | Ht 61.0 in | Wt 184.6 lb

## 2017-07-24 DIAGNOSIS — N183 Chronic kidney disease, stage 3 unspecified: Secondary | ICD-10-CM

## 2017-07-24 DIAGNOSIS — R7301 Impaired fasting glucose: Secondary | ICD-10-CM | POA: Diagnosis not present

## 2017-07-24 DIAGNOSIS — E7849 Other hyperlipidemia: Secondary | ICD-10-CM

## 2017-07-24 DIAGNOSIS — J309 Allergic rhinitis, unspecified: Secondary | ICD-10-CM

## 2017-07-24 DIAGNOSIS — Z5181 Encounter for therapeutic drug level monitoring: Secondary | ICD-10-CM

## 2017-07-24 DIAGNOSIS — E8881 Metabolic syndrome: Secondary | ICD-10-CM | POA: Diagnosis not present

## 2017-07-24 NOTE — Assessment & Plan Note (Signed)
Check CMP.  ?

## 2017-07-24 NOTE — Assessment & Plan Note (Signed)
Planning to work on her diet.

## 2017-07-24 NOTE — Assessment & Plan Note (Signed)
Stable, continue present medications.   

## 2017-07-24 NOTE — Assessment & Plan Note (Signed)
Check labs and Hgb A1C

## 2017-07-24 NOTE — Progress Notes (Signed)
BP 125/73   Pulse 61   Temp 97.6 F (36.4 C)   Ht 5\' 1"  (1.549 m)   Wt 184 lb 9.6 oz (83.7 kg)   LMP  (LMP Unknown)   SpO2 98%   BMI 34.88 kg/m    Subjective:    Patient ID: Bethany Lara, female    DOB: December 14, 1950, 66 y.o.   MRN: [id number]  HPI: Bethany Lara is a 66 y.o. female  Chief Complaint  Patient presents with  . Annual Exam    pt had wellness exam with [facility] 07/17/17   Right knee pain Pt fell on knee on 10/25.  The knee is getting better with some lingering discomfort.  She is wondering if she should see an Orthopedist.  She is taking Ibuprofen  Hyperlipidemia Using medications without problems: No Muscle aches  Diet compliance:Exercise: she was exercising some by walking.  Tries to cut back and watch her carbs.  She is planning to make a menu and make what is on the menu  Social History   Social History  . Marital status: Married    Spouse name: N/A  . Number of children: N/A  . Years of education: N/A   Occupational History  . Not on file.   Social History Main Topics  . Smoking status: Never Smoker  . Smokeless tobacco: Never Used  . Alcohol use No  . Drug use: No  . Sexual activity: Yes   Other Topics Concern  . Not on file   Social History Narrative  . No narrative on file   Family History  Problem Relation Age of Onset  . Diabetes Mother   . Hypertension Mother   . Dementia Mother   . Diabetes Father   . Heart disease Father   . Alzheimer's disease Father   . Glaucoma Father   . Colon cancer Father   . Breast cancer Maternal Aunt    Past Medical History:  Diagnosis Date  . Allergy   . Carpal tunnel syndrome   . Hyperlipidemia    Past Surgical History:  Procedure Laterality Date  . BUNIONECTOMY    . CARPAL TUNNEL RELEASE  11/14/2015  . CHOLECYSTECTOMY      Relevant past medical, surgical, family and social history reviewed and updated as indicated. Interim medical history since our last visit  reviewed. Allergies and medications reviewed and updated.  Review of Systems  Constitutional: Negative.   HENT: Negative.   Eyes: Negative.   Respiratory: Negative.   Cardiovascular: Negative.   Gastrointestinal: Negative.   Endocrine: Negative.   Genitourinary: Negative.   Musculoskeletal: Negative.   Skin: Negative.   Allergic/Immunologic: Negative.   Neurological: Negative.   Hematological: Negative.   Psychiatric/Behavioral: Negative.     Per HPI unless specifically indicated above     Objective:    BP 125/73   Pulse 61   Temp 97.6 F (36.4 C)   Ht 5\' 1"  (1.549 m)   Wt 184 lb 9.6 oz (83.7 kg)   LMP  (LMP Unknown)   SpO2 98%   BMI 34.88 kg/m   Wt Readings from Last 3 Encounters:  07/24/17 184 lb 9.6 oz (83.7 kg)  07/18/17 185 lb (83.9 kg)  07/18/17 185 lb 12.8 oz (84.3 kg)    Physical Exam  Constitutional: She is oriented to person, place, and time. She appears well-developed and well-nourished. No distress.  HENT:  Head: Normocephalic and atraumatic.  Eyes: Conjunctivae and lids are normal. Right eye exhibits  no discharge. Left eye exhibits no discharge. No scleral icterus.  Neck: Normal range of motion. Neck supple. No JVD present. Carotid bruit is not present.  Cardiovascular: Normal rate, regular rhythm and normal heart sounds.   Pulmonary/Chest: Effort normal and breath sounds normal.  Abdominal: Normal appearance. There is no splenomegaly or hepatomegaly.  Musculoskeletal: Normal range of motion.  Neurological: She is alert and oriented to person, place, and time.  Skin: Skin is warm, dry and intact. No rash noted. No pallor.  Psychiatric: She has a normal mood and affect. Her behavior is normal. Judgment and thought content normal.    Results for orders placed or performed in visit on 96/75/91  Basic Metabolic Panel (BMET)  Result Value Ref Range   Glucose 81 65 - 99 mg/dL   BUN 10 8 - 27 mg/dL   Creatinine, Ser 0.88 0.57 - 1.00 mg/dL   GFR calc  non Af Amer 68 >59 mL/min/1.73   GFR calc Af Amer 78 >59 mL/min/1.73   BUN/Creatinine Ratio 11 (L) 12 - 28   Sodium 141 134 - 144 mmol/L   Potassium 4.2 3.5 - 5.2 mmol/L   Chloride 104 96 - 106 mmol/L   CO2 22 20 - 29 mmol/L   Calcium 9.6 8.7 - 10.3 mg/dL      Assessment & Plan:   Problem List Items Addressed This Visit      Unprioritized   Allergic rhinitis    Stable, continue present medications.        Chronic kidney disease, stage 3 ([facility])   Relevant Orders   Comprehensive metabolic panel   CBC with Differential/Platelet   VITAMIN D 25 Hydroxy (Vit-D Deficiency, Fractures)   Hyperlipidemia    On statin.  Check labs today      Relevant Orders   Lipid Panel w/o Chol/HDL Ratio   IFG (impaired fasting glucose) - Primary    Check labs and Hgb A1C      Medication monitoring encounter    Check CMP      Metabolic syndrome    Planning to work on her diet.            Follow up plan: Return in about 6 months (around 01/21/2018).

## 2017-07-24 NOTE — Assessment & Plan Note (Signed)
On statin.  Check labs today

## 2017-07-25 LAB — LIPID PANEL W/O CHOL/HDL RATIO
CHOLESTEROL TOTAL: 211 mg/dL — AB (ref 100–199)
HDL: 53 mg/dL (ref >39)
LDL CALC: 115 mg/dL — AB (ref 0–99)
TRIGLYCERIDES: 217 mg/dL — AB (ref 0–149)
VLDL CHOLESTEROL CAL: 43 mg/dL — AB (ref 5–40)

## 2017-07-25 LAB — CBC WITH DIFFERENTIAL/PLATELET
BASOS ABS: 0.1 10*3/uL (ref 0.0–0.2)
Basos: 1 %
EOS (ABSOLUTE): 0.3 10*3/uL (ref 0.0–0.4)
Eos: 4 %
HEMOGLOBIN: 13.3 g/dL (ref 11.1–15.9)
Hematocrit: 40.5 % (ref 34.0–46.6)
Immature Grans (Abs): 0 10*3/uL (ref 0.0–0.1)
Immature Granulocytes: 0 %
LYMPHS ABS: 2 10*3/uL (ref 0.7–3.1)
Lymphs: 24 %
MCH: 29.3 pg (ref 26.6–33.0)
MCHC: 32.8 g/dL (ref 31.5–35.7)
MCV: 89 fL (ref 79–97)
MONOS ABS: 0.7 10*3/uL (ref 0.1–0.9)
Monocytes: 8 %
NEUTROS ABS: 5.4 10*3/uL (ref 1.4–7.0)
Neutrophils: 63 %
PLATELETS: 270 10*3/uL (ref 150–379)
RBC: 4.54 x10E6/uL (ref 3.77–5.28)
RDW: 13.7 % (ref 12.3–15.4)
WBC: 8.5 10*3/uL (ref 3.4–10.8)

## 2017-07-25 LAB — COMPREHENSIVE METABOLIC PANEL
ALBUMIN: 4.4 g/dL (ref 3.6–4.8)
ALK PHOS: 72 IU/L (ref 39–117)
ALT: 13 IU/L (ref 0–32)
AST: 20 IU/L (ref 0–40)
Albumin/Globulin Ratio: 1.9 (ref 1.2–2.2)
BUN/Creatinine Ratio: 14 (ref 12–28)
BUN: 15 mg/dL (ref 8–27)
Bilirubin Total: 0.6 mg/dL (ref 0.0–1.2)
CO2: 21 mmol/L (ref 20–29)
CREATININE: 1.04 mg/dL — AB (ref 0.57–1.00)
Calcium: 9.4 mg/dL (ref 8.7–10.3)
Chloride: 105 mmol/L (ref 96–106)
GFR calc Af Amer: 64 mL/min/{1.73_m2} (ref >59)
GFR calc non Af Amer: 55 mL/min/{1.73_m2} — ABNORMAL LOW (ref >59)
GLUCOSE: 101 mg/dL — AB (ref 65–99)
Globulin, Total: 2.3 g/dL (ref 1.5–4.5)
Potassium: 4.5 mmol/L (ref 3.5–5.2)
Sodium: 142 mmol/L (ref 134–144)
Total Protein: 6.7 g/dL (ref 6.0–8.5)

## 2017-07-25 LAB — VITAMIN D 25 HYDROXY (VIT D DEFICIENCY, FRACTURES): VIT D 25 HYDROXY: 26.5 ng/mL — AB (ref 30.0–100.0)

## 2017-07-26 ENCOUNTER — Encounter: Payer: Self-pay | Admitting: Unknown Physician Specialty

## 2017-07-26 NOTE — Progress Notes (Signed)
Notified pt by mychart

## 2017-08-09 ENCOUNTER — Ambulatory Visit (INDEPENDENT_AMBULATORY_CARE_PROVIDER_SITE_OTHER): Payer: Medicare Other | Admitting: Unknown Physician Specialty

## 2017-08-09 ENCOUNTER — Encounter: Payer: Self-pay | Admitting: Unknown Physician Specialty

## 2017-08-09 VITALS — BP 128/85 | HR 103 | Temp 98.5°F | Wt 181.6 lb

## 2017-08-09 DIAGNOSIS — J181 Lobar pneumonia, unspecified organism: Secondary | ICD-10-CM

## 2017-08-09 DIAGNOSIS — J189 Pneumonia, unspecified organism: Secondary | ICD-10-CM

## 2017-08-09 MED ORDER — AMOXICILLIN 875 MG PO TABS: 875.0000 mg | ORAL_TABLET | Freq: Two times a day (BID) | ORAL | 0 refills | Status: DC

## 2017-08-09 MED ORDER — BENZONATATE 100 MG PO CAPS: 100.0000 mg | ORAL_CAPSULE | Freq: Two times a day (BID) | ORAL | 0 refills | Status: DC | PRN

## 2017-08-09 MED ORDER — AZITHROMYCIN 250 MG PO TABS
ORAL_TABLET | ORAL | 0 refills | Status: DC
Start: 2017-08-09 — End: 2017-09-06

## 2017-08-09 NOTE — Progress Notes (Signed)
BP 128/85   Pulse (!) 103   Temp 98.5 F (36.9 C) (Oral)   Wt 181 lb 9.6 oz (82.4 kg)   LMP  (LMP Unknown)   SpO2 97%   BMI 34.31 kg/m    Subjective:    Patient ID: Bethany Lara, female    DOB: 10/07/1950, 66 y.o.   MRN: [id number]  HPI: Bethany Lara is a 66 y.o. female  Chief Complaint  Patient presents with  . URI    pt states she has had a fever of 101 and has been feeling bad since Tuesday    Pt with complaints of cough with systemic effects of fever, chills, and aching.  URI   This is a new problem. Episode onset: 3 days. The maximum temperature recorded prior to her arrival was 101 - 101.9 F. The fever has been present for 1 to 2 days. Associated symptoms include congestion, coughing, headaches, joint pain, nausea, rhinorrhea and sinus pain. Pertinent negatives include no abdominal pain, chest pain, diarrhea, dysuria, ear pain, neck pain, plugged ear sensation, sore throat or swollen glands.    Relevant past medical, surgical, family and social history reviewed and updated as indicated. Interim medical history since our last visit reviewed. Allergies and medications reviewed and updated.  Review of Systems  HENT: Positive for congestion, rhinorrhea and sinus pain. Negative for ear pain and sore throat.   Respiratory: Positive for cough.   Cardiovascular: Negative for chest pain.  Gastrointestinal: Positive for nausea. Negative for abdominal pain and diarrhea.  Genitourinary: Negative for dysuria.  Musculoskeletal: Positive for joint pain. Negative for neck pain.  Neurological: Positive for headaches.    Per HPI unless specifically indicated above     Objective:    BP 128/85   Pulse (!) 103   Temp 98.5 F (36.9 C) (Oral)   Wt 181 lb 9.6 oz (82.4 kg)   LMP  (LMP Unknown)   SpO2 97%   BMI 34.31 kg/m   Wt Readings from Last 3 Encounters:  08/09/17 181 lb 9.6 oz (82.4 kg)  07/24/17 184 lb 9.6 oz (83.7 kg)  07/18/17 185 lb (83.9 kg)      Physical Exam  Constitutional: She is oriented to person, place, and time. She appears well-developed and well-nourished. No distress.  HENT:  Head: Normocephalic and atraumatic.  Right Ear: Tympanic membrane and ear canal normal.  Left Ear: Tympanic membrane and ear canal normal.  Nose: Rhinorrhea present. Right sinus exhibits no maxillary sinus tenderness and no frontal sinus tenderness. Left sinus exhibits no maxillary sinus tenderness and no frontal sinus tenderness.  Mouth/Throat: Mucous membranes are normal. Posterior oropharyngeal erythema present.  Eyes: Conjunctivae and lids are normal. Right eye exhibits no discharge. Left eye exhibits no discharge. No scleral icterus.  Cardiovascular: Normal rate and regular rhythm.  Pulmonary/Chest: Effort normal. No respiratory distress. She has rales in the right lower field.  Abdominal: Normal appearance. There is no splenomegaly or hepatomegaly.  Musculoskeletal: Normal range of motion.  Neurological: She is alert and oriented to person, place, and time.  Skin: Skin is intact. No rash noted. No pallor.  Psychiatric: She has a normal mood and affect. Her behavior is normal. Judgment and thought content normal.    Results for orders placed or performed in visit on 07/24/17  Comprehensive metabolic panel  Result Value Ref Range   Glucose 101 (H) 65 - 99 mg/dL   BUN 15 8 - 27 mg/dL   Creatinine, Ser 1.04 (H)  0.57 - 1.00 mg/dL   GFR calc non Af Amer 55 (L) >59 mL/min/1.73   GFR calc Af Amer 64 >59 mL/min/1.73   BUN/Creatinine Ratio 14 12 - 28   Sodium 142 134 - 144 mmol/L   Potassium 4.5 3.5 - 5.2 mmol/L   Chloride 105 96 - 106 mmol/L   CO2 21 20 - 29 mmol/L   Calcium 9.4 8.7 - 10.3 mg/dL   Total Protein 6.7 6.0 - 8.5 g/dL   Albumin 4.4 3.6 - 4.8 g/dL   Globulin, Total 2.3 1.5 - 4.5 g/dL   Albumin/Globulin Ratio 1.9 1.2 - 2.2   Bilirubin Total 0.6 0.0 - 1.2 mg/dL   Alkaline Phosphatase 72 39 - 117 IU/L   AST 20 0 - 40 IU/L   ALT 13  0 - 32 IU/L  Lipid Panel w/o Chol/HDL Ratio  Result Value Ref Range   Cholesterol, Total 211 (H) 100 - 199 mg/dL   Triglycerides 217 (H) 0 - 149 mg/dL   HDL 53 >39 mg/dL   VLDL Cholesterol Cal 43 (H) 5 - 40 mg/dL   LDL Calculated 115 (H) 0 - 99 mg/dL  CBC with Differential/Platelet  Result Value Ref Range   WBC 8.5 3.4 - 10.8 x10E3/uL   RBC 4.54 3.77 - 5.28 x10E6/uL   Hemoglobin 13.3 11.1 - 15.9 g/dL   Hematocrit 40.5 34.0 - 46.6 %   MCV 89 79 - 97 fL   MCH 29.3 26.6 - 33.0 pg   MCHC 32.8 31.5 - 35.7 g/dL   RDW 13.7 12.3 - 15.4 %   Platelets 270 150 - 379 x10E3/uL   Neutrophils 63 Not Estab. %   Lymphs 24 Not Estab. %   Monocytes 8 Not Estab. %   Eos 4 Not Estab. %   Basos 1 Not Estab. %   Neutrophils Absolute 5.4 1.4 - 7.0 x10E3/uL   Lymphocytes Absolute 2.0 0.7 - 3.1 x10E3/uL   Monocytes Absolute 0.7 0.1 - 0.9 x10E3/uL   EOS (ABSOLUTE) 0.3 0.0 - 0.4 x10E3/uL   Basophils Absolute 0.1 0.0 - 0.2 x10E3/uL   Immature Granulocytes 0 Not Estab. %   Immature Grans (Abs) 0.0 0.0 - 0.1 x10E3/uL  VITAMIN D 25 Hydroxy (Vit-D Deficiency, Fractures)  Result Value Ref Range   Vit D, 25-Hydroxy 26.5 (L) 30.0 - 100.0 ng/mL      Assessment & Plan:  Acute illness with systemic effects  Problem List Items Addressed This Visit    None    Visit Diagnoses    Pneumonia of right lower lobe due to infectious organism ([place])    -  Primary   with right lower lung crackles.  Will treat for community acquired pneumonia.  Rx for Z pack plus Amoxil.  Rest, fluids.  Discussed being around her mother   Relevant Medications   azithromycin (ZITHROMAX) 250 MG tablet   amoxicillin (AMOXIL) 875 MG tablet   benzonatate (TESSALON) 100 MG capsule       Follow up plan: Return if symptoms worsen or fail to improve.

## 2017-08-15 ENCOUNTER — Other Ambulatory Visit: Payer: Self-pay | Admitting: Unknown Physician Specialty

## 2017-08-30 ENCOUNTER — Emergency Department
Admission: EM | Admit: 2017-08-30 | Discharge: 2017-08-31 | Disposition: A | Discharge status: Home / Self Care | Payer: Medicare Other | Attending: Emergency Medicine | Admitting: Emergency Medicine

## 2017-08-30 ENCOUNTER — Emergency Department: Payer: Medicare Other

## 2017-08-30 DIAGNOSIS — Z79899 Other long term (current) drug therapy: Secondary | ICD-10-CM | POA: Diagnosis not present

## 2017-08-30 DIAGNOSIS — Z7982 Long term (current) use of aspirin: Secondary | ICD-10-CM | POA: Diagnosis not present

## 2017-08-30 DIAGNOSIS — R42 Dizziness and giddiness: Secondary | ICD-10-CM | POA: Insufficient documentation

## 2017-08-30 DIAGNOSIS — N183 Chronic kidney disease, stage 3 (moderate): Secondary | ICD-10-CM | POA: Insufficient documentation

## 2017-08-30 LAB — DIFFERENTIAL
Basophils Absolute: 0.1 10*3/uL (ref 0–0.1)
Basophils Relative: 1 %
Eosinophils Absolute: 0.4 10*3/uL (ref 0–0.7)
Eosinophils Relative: 5 %
LYMPHS PCT: 20 %
Lymphs Abs: 1.8 10*3/uL (ref 1.0–3.6)
Monocytes Absolute: 0.6 10*3/uL (ref 0.2–0.9)
Monocytes Relative: 6 %
NEUTROS ABS: 6 10*3/uL (ref 1.4–6.5)
NEUTROS PCT: 68 %

## 2017-08-30 LAB — COMPREHENSIVE METABOLIC PANEL
ALBUMIN: 4.3 g/dL (ref 3.5–5.0)
ALK PHOS: 69 U/L (ref 38–126)
ALT: 20 U/L (ref 14–54)
AST: 23 U/L (ref 15–41)
Anion gap: 9 (ref 5–15)
BUN: 13 mg/dL (ref 6–20)
CALCIUM: 9.3 mg/dL (ref 8.9–10.3)
CO2: 23 mmol/L (ref 22–32)
CREATININE: 0.87 mg/dL (ref 0.44–1.00)
Chloride: 107 mmol/L (ref 101–111)
GFR calc Af Amer: >60 mL/min (ref >60)
GFR calc non Af Amer: >60 mL/min (ref >60)
GLUCOSE: 128 mg/dL — AB (ref 65–99)
Potassium: 3.7 mmol/L (ref 3.5–5.1)
SODIUM: 139 mmol/L (ref 135–145)
Total Bilirubin: 0.6 mg/dL (ref 0.3–1.2)
Total Protein: 7.2 g/dL (ref 6.5–8.1)

## 2017-08-30 LAB — CBC
HCT: 40.8 % (ref 35.0–47.0)
Hemoglobin: 13.8 g/dL (ref 12.0–16.0)
MCH: 29.8 pg (ref 26.0–34.0)
MCHC: 33.7 g/dL (ref 32.0–36.0)
MCV: 88.2 fL (ref 80.0–100.0)
PLATELETS: 281 10*3/uL (ref 150–440)
RBC: 4.63 MIL/uL (ref 3.80–5.20)
RDW: 13.8 % (ref 11.5–14.5)
WBC: 8.7 10*3/uL (ref 3.6–11.0)

## 2017-08-30 LAB — TROPONIN I: Troponin I: <0.03 ng/mL (ref <0.03)

## 2017-08-30 MED ORDER — SODIUM CHLORIDE 0.9 % IV BOLUS (SEPSIS)
1000.0000 mL | Freq: Once | INTRAVENOUS | Status: AC
  Administered 2017-08-30: 1000 mL via INTRAVENOUS

## 2017-08-30 NOTE — ED Triage Notes (Signed)
Pt states dizziness since 1730. Pt states that her family told her her speech "was off" for twenty minutes. Pt with clear speech, equal hand grips and symmetrical face noted. Pt states she continues to feel dizzy when turning head.

## 2017-08-31 ENCOUNTER — Emergency Department: Payer: Medicare Other

## 2017-08-31 MED ORDER — OXYMETAZOLINE HCL 0.05 % NA SOLN: 2.0000 | Freq: Two times a day (BID) | NASAL | 0 refills | Status: AC

## 2017-08-31 NOTE — ED Provider Notes (Signed)
[facility] Emergency Department Provider Note   ____________________________________________   First MD Initiated Contact with Patient 08/30/17 2316     (approximate)  I have reviewed the triage vital signs and the nursing notes.   HISTORY  Chief Complaint Dizziness    HPI Bethany Lara is a 66 y.o. female who comes into the hospital today with some dizziness.  The patient states that she was at home laying down and she started feeling dizzy.  The patient sat up and felt like the room was spinning.  She tried not to move and states that every time she moved her set up the room was start spinning.  The patient reports that she had a similar episode at the end of August.  She was admitted to the hospital and had all the tests but nothing was discovered.  The patient was told that she had vertigo.  The patient took a dose of meclizine tonight and states that she started feeling better while she was in the lobby.  She had some nausea with no vomiting and denies any chest pain or shortness of breath.  She states that during the episode though her speech felt slurred and she thought it was hard to swallow.  She is recently undergone treatment for pneumonia.  The patient is also had some pain underneath her right eye around her sinus area.  She is here today for evaluation of her symptoms.   Past Medical History:  Diagnosis Date  . Allergy   . Carpal tunnel syndrome   . Hyperlipidemia     Patient Active Problem List   Diagnosis Date Noted  . Medication monitoring encounter 07/24/2017  . Bradycardia 05/20/2017  . Vertigo 05/20/2017  . Chronic kidney disease, stage 3 ([place]) 01/21/2017  . Metabolic syndrome 07/37/1062  . IFG (impaired fasting glucose) 01/20/2016  . Eczema 07/22/2015  . Hyperlipidemia 07/15/2015  . Allergic rhinitis 07/15/2015  . Carpal tunnel syndrome 07/15/2015    Past Surgical History:  Procedure Laterality Date  . BUNIONECTOMY      . CARPAL TUNNEL RELEASE  11/14/2015  . CHOLECYSTECTOMY      Prior to Admission medications   Medication Sig Start Date End Date Taking? Authorizing Provider  aspirin 81 MG tablet Take 81 mg by mouth daily.   Yes [provider]  cetirizine (ZYRTEC) 10 MG tablet Take 1 tablet (10 mg total) by mouth daily. 05/24/17  Yes Volney American, PA-C  fenofibrate 160 MG tablet Take 1 tablet (160 mg total) by mouth daily. 01/21/17  Yes Kathrine Haddock, NP  fluticasone (FLONASE) 50 MCG/ACT nasal spray Place 2 sprays into both nostrils 2 (two) times daily. 05/24/17  Yes Volney American, PA-C  Multiple Vitamin (MULTIVITAMIN) tablet Take 1 tablet by mouth daily.   Yes [provider]  pravastatin (PRAVACHOL) 40 MG tablet TAKE 1 TABLET (40 MG TOTAL) BY MOUTH AT BEDTIME. 08/16/17  Yes Kathrine Haddock, NP  VITAMIN D, CHOLECALCIFEROL, PO Take 2,000 Units by mouth daily.   Yes [provider]  amoxicillin (AMOXIL) 875 MG tablet Take 1 tablet (875 mg total) 2 (two) times daily by mouth. Patient not taking: Reported on 08/30/2017 08/09/17   Kathrine Haddock, NP  azithromycin [facility]) 250 MG tablet As directed Patient not taking: Reported on 08/30/2017 08/09/17   Kathrine Haddock, NP  benzonatate (TESSALON) 100 MG capsule Take 1 capsule (100 mg total) 2 (two) times daily as needed by mouth for cough. Patient not taking: Reported on  08/30/2017 08/09/17   Kathrine Haddock, NP  hydrOXYzine (VISTARIL) 50 MG capsule Take 1 capsule (50 mg total) by mouth 3 (three) times daily as needed. Patient not taking: Reported on 08/09/2017 06/20/16   Volney American, PA-C  meclizine (ANTIVERT) 25 MG tablet Take 1 tablet (25 mg total) by mouth 3 (three) times daily as needed for dizziness. Patient not taking: Reported on 08/09/2017 05/24/17   Volney American, PA-C  oxymetazoline (AFRIN) 0.05 % nasal spray Place 2 sprays into both nostrils 2 (two) times daily for 5 days. 08/31/17 09/05/17   Loney Hering, MD    Allergies Zocor [simvastatin]  Family History  Problem Relation Age of Onset  . Diabetes Mother   . Hypertension Mother   . Dementia Mother   . Diabetes Father   . Heart disease Father   . Alzheimer's disease Father   . Glaucoma Father   . Colon cancer Father   . Breast cancer Maternal Aunt     Social History Social History   Tobacco Use  . Smoking status: Never Smoker  . Smokeless tobacco: Never Used  Substance Use Topics  . Alcohol use: No    Alcohol/week: 0.0 oz  . Drug use: No    Review of Systems  Constitutional: No fever/chills Eyes: No visual changes. ENT: No sore throat. Cardiovascular: Denies chest pain. Respiratory: Denies shortness of breath. Gastrointestinal: Nausea with no abdominal pain.   no vomiting.  No diarrhea.  No constipation. Genitourinary: Negative for dysuria. Musculoskeletal: Negative for back pain. Skin: Negative for rash. Neurological: Dizziness, slurred speech, difficulty swallowing   ____________________________________________   PHYSICAL EXAM:  VITAL SIGNS: ED Triage Vitals [08/30/17 1951]  Enc Vitals Group     BP (!) 142/79     Pulse Rate 64     Resp 16     Temp 97.9 F (36.6 C)     Temp src      SpO2 98 %     Weight 180 lb (81.6 kg)     Height 5\' 1"  (1.549 m)     Head Circumference      Peak Flow      Pain Score      Pain Loc      Pain Edu?      Excl. in [place]?     Constitutional: Alert and oriented. Well appearing and in mild distress. Ears: Fluid noted behind Right TM, gray flat and dull with no erythema. Eyes: Conjunctivae are normal. PERRL. EOMI. Head: Atraumatic. Nose: No congestion/rhinnorhea. Mouth/Throat: Mucous membranes are moist.  Oropharynx non-erythematous. Cardiovascular: Normal rate, regular rhythm. Grossly normal heart sounds.  Good peripheral circulation. Respiratory: Normal respiratory effort.  No retractions. Lungs CTAB. Gastrointestinal: Soft and nontender. No  distention.  Positive bowel sounds Musculoskeletal: No lower extremity tenderness nor edema.   Neurologic:  Normal speech and language.  Cranial nerves II through XII are grossly intact with no focal motor neuro deficit, finger to nose intact, rapid alternating from and intact. Skin:  Skin is warm, dry and intact.  Psychiatric: Mood and affect are normal.   ____________________________________________   LABS (all labs ordered are listed, but only abnormal results are displayed)  Labs Reviewed  COMPREHENSIVE METABOLIC PANEL - Abnormal; Notable for the following components:      Result Value   Glucose, Bld 128 (*)    All other components within normal limits  CBC  DIFFERENTIAL  TROPONIN I   ____________________________________________  EKG  ED ECG REPORT I, [place],  Eduard Roux, the attending physician, personally viewed and interpreted this ECG.   Date: 08/30/2017  EKG Time: 2001  Rate: 60  Rhythm: normal sinus rhythm  Axis: normal  Intervals:none  ST&T Change: none  ____________________________________________  RADIOLOGY  Ct Head Wo Contrast  Result Date: 08/30/2017 CLINICAL DATA:  Dizziness and transient slurred speech EXAM: CT HEAD WITHOUT CONTRAST TECHNIQUE: Contiguous axial images were obtained from the base of the skull through the vertex without intravenous contrast. COMPARISON:  Head CT and brain MRI May 21, 2017 FINDINGS: Brain: The ventricles are normal in size and configuration. There is no intracranial mass, hemorrhage, extra-axial fluid collection, or midline shift. Gray-white compartments appear normal. No demonstrable acute infarct. Vascular: There is no hyperdense vessel. There is calcification in each distal vertebral artery. There is also slight calcification in the left carotid siphon. Skull: The bony calvarium appears intact. There is a small benign exostosis in the right frontal region. There is an apparent sebaceous cyst arising in the left frontal  scalp region measuring 9 x 7 mm. Sinuses/Orbits: There is mild mucosal thickening in several ethmoid air cells. Visualized paranasal sinuses elsewhere clear. Visualized orbits appear symmetric bilaterally. Other: Mastoid air cells are clear. IMPRESSION: No intracranial mass or hemorrhage. Gray-white compartments are normal. Foci of arterial vascular calcification noted. Mild mucosal thickening in several ethmoid air cells. Electronically Signed   By: Lowella Grip III M.D.   On: 08/30/2017 20:37   Mr Brain Wo Contrast  Result Date: 08/31/2017 CLINICAL DATA:  66 y/o  F; dizziness and transient dysarthria. EXAM: MRI HEAD WITHOUT CONTRAST TECHNIQUE: Multiplanar, multiecho pulse sequences of the brain and surrounding structures were obtained without intravenous contrast. COMPARISON:  08/30/2017 CT head.  05/20/2017 MRI head. FINDINGS: Brain: No acute infarction, hemorrhage, hydrocephalus, extra-axial collection or mass lesion. Few stable nonspecific foci of T2 FLAIR hyperintense signal abnormality in subcortical and periventricular white matter are compatible with mild chronic microvascular ischemic changes for age. Mild brain parenchymal volume loss. Vascular: Normal flow voids. Skull and upper cervical spine: Stable small right frontal bone osteoma left frontal scalp dermal appendage cyst. Sinuses/Orbits: Negative. Other: None. IMPRESSION: 1. No acute intracranial abnormality. 2. Stable mild chronic microvascular ischemic changes and mild parenchymal volume loss of the brain. Electronically Signed   By: Kristine Garbe M.D.   On: 08/31/2017 02:09    ____________________________________________   PROCEDURES  Procedure(s) performed: None  Procedures  Critical Care performed: No  ____________________________________________   INITIAL IMPRESSION / ASSESSMENT AND PLAN / ED COURSE  As part of my medical decision making, I reviewed the following data within the electronic MEDICAL RECORD NUMBER  Notes from prior ED visits and [place] Controlled Substance Database   This is a 66 year old female who comes into the hospital today with some dizziness.  The patient also had some slurred speech and difficulty swallowing.  My differential diagnosis includes vertigo, stroke, TIA  The patient had some blood work drawn which was unremarkable.  She had a CT head which was also unremarkable.  While the improvement of her symptoms after meclizine and her history of vertigo does lead to possibility of vertigo my concern is for her slurred speech episode as well as difficulty swallowing.  I will send the patient for an MRI of her brain and she will be reassessed once I receive those results.     Patient's MRI resulted showing no acute intracranial abnormality and some stable mild chronic microvascular ischemic changes.  The patient still has improvement of her  vertigo symptoms.  I feel that some of her symptoms may be due to the fluid behind her ear.  I have encouraged the patient to follow back up with her primary care physician and possibly ENT.  Also given the sinus symptoms I will give her some Afrin to help open up her sinus passages.  The patient will be discharged home.  ____________________________________________   FINAL CLINICAL IMPRESSION(S) / ED DIAGNOSES  Final diagnoses:  Dizziness  Vertigo     ED Discharge Orders        Ordered    oxymetazoline (AFRIN) 0.05 % nasal spray  2 times daily     08/31/17 0227       Note:  This document was prepared using Dragon voice recognition software and may include unintentional dictation errors.    Loney Hering, MD 08/31/17 [phone]

## 2017-08-31 NOTE — Discharge Instructions (Signed)
Please follow up with your primary care physician for further evaluation of your vertigo symptoms

## 2017-08-31 NOTE — ED Notes (Addendum)
Bethany Lara will take patient to MRI after returning from the floor.

## 2017-08-31 NOTE — ED Notes (Signed)
MRI at bedside to transport patient for their scan

## 2017-08-31 NOTE — ED Notes (Signed)
MRI is ready for patient in room 1.

## 2017-09-06 ENCOUNTER — Ambulatory Visit (INDEPENDENT_AMBULATORY_CARE_PROVIDER_SITE_OTHER): Payer: Medicare Other | Admitting: Unknown Physician Specialty

## 2017-09-06 ENCOUNTER — Encounter: Payer: Self-pay | Admitting: Unknown Physician Specialty

## 2017-09-06 VITALS — BP 137/92 | HR 78 | Temp 97.6°F | Wt 185.2 lb

## 2017-09-06 DIAGNOSIS — R42 Dizziness and giddiness: Secondary | ICD-10-CM | POA: Diagnosis not present

## 2017-09-06 DIAGNOSIS — J0100 Acute maxillary sinusitis, unspecified: Secondary | ICD-10-CM

## 2017-09-06 MED ORDER — AMOXICILLIN-POT CLAVULANATE 875-125 MG PO TABS: 1.0000 | ORAL_TABLET | Freq: Two times a day (BID) | ORAL | 0 refills | Status: DC

## 2017-09-06 NOTE — Progress Notes (Signed)
BP (!) 137/92   Pulse 78   Temp 97.6 F (36.4 C) (Oral)   Wt 185 lb 3.2 oz (84 kg)   LMP  (LMP Unknown)   SpO2 97%   BMI 34.99 kg/m    Subjective:    Patient ID: Bethany Lara, female    DOB: 1951-07-31, 66 y.o.   MRN: [id number]  HPI: Bethany Lara is a 66 y.o. female  Chief Complaint  Patient presents with  . URI    pt states she has had a headache, congestion, pressure, bloody phlegm, and right ear pain for about a week    Sinusitis  This is a new problem. The current episode started in the past 7 days. The problem has been rapidly worsening since onset. She is experiencing no pain. Associated symptoms include congestion, ear pain, headaches, sinus pressure and sneezing. Pertinent negatives include no chills, coughing, diaphoresis, hoarse voice, neck pain, shortness of breath, sore throat or swollen glands. Treatments tried: Treated for vertigo.   Went to the ER with symptoms of vertigo approximately 1 week ago.    Relevant past medical, surgical, family and social history reviewed and updated as indicated. Interim medical history since our last visit reviewed. Allergies and medications reviewed and updated.  Review of Systems  Constitutional: Negative for chills and diaphoresis.  HENT: Positive for congestion, ear pain, sinus pressure and sneezing. Negative for hoarse voice and sore throat.   Respiratory: Negative for cough and shortness of breath.   Musculoskeletal: Negative for neck pain.  Neurological: Positive for headaches.    Per HPI unless specifically indicated above     Objective:    BP (!) 137/92   Pulse 78   Temp 97.6 F (36.4 C) (Oral)   Wt 185 lb 3.2 oz (84 kg)   LMP  (LMP Unknown)   SpO2 97%   BMI 34.99 kg/m   Wt Readings from Last 3 Encounters:  09/06/17 185 lb 3.2 oz (84 kg)  08/30/17 180 lb (81.6 kg)  08/09/17 181 lb 9.6 oz (82.4 kg)    Physical Exam  Constitutional: She is oriented to person, place, and time. She appears  well-developed and well-nourished. No distress.  HENT:  Head: Normocephalic and atraumatic.  Right Ear: Tympanic membrane and ear canal normal.  Left Ear: Tympanic membrane and ear canal normal.  Nose: No rhinorrhea. Right sinus exhibits maxillary sinus tenderness. Right sinus exhibits no frontal sinus tenderness. Left sinus exhibits maxillary sinus tenderness. Left sinus exhibits no frontal sinus tenderness.  Eyes: Conjunctivae and lids are normal. Right eye exhibits no discharge. Left eye exhibits no discharge. No scleral icterus.  Cardiovascular: Normal rate and regular rhythm.  Pulmonary/Chest: Effort normal and breath sounds normal. No respiratory distress.  Abdominal: Normal appearance. There is no splenomegaly or hepatomegaly.  Musculoskeletal: Normal range of motion.  Neurological: She is alert and oriented to person, place, and time.  Skin: Skin is intact. No rash noted. No pallor.  Psychiatric: She has a normal mood and affect. Her behavior is normal. Judgment and thought content normal.    Results for orders placed or performed during the hospital encounter of 08/30/17  CBC  Result Value Ref Range   WBC 8.7 3.6 - 11.0 K/uL   RBC 4.63 3.80 - 5.20 MIL/uL   Hemoglobin 13.8 12.0 - 16.0 g/dL   HCT 40.8 35.0 - 47.0 %   MCV 88.2 80.0 - 100.0 fL   MCH 29.8 26.0 - 34.0 pg   MCHC 33.7  32.0 - 36.0 g/dL   RDW 13.8 11.5 - 14.5 %   Platelets 281 150 - 440 K/uL  Differential  Result Value Ref Range   Neutrophils Relative % 68 %   Neutro Abs 6.0 1.4 - 6.5 K/uL   Lymphocytes Relative 20 %   Lymphs Abs 1.8 1.0 - 3.6 K/uL   Monocytes Relative 6 %   Monocytes Absolute 0.6 0.2 - 0.9 K/uL   Eosinophils Relative 5 %   Eosinophils Absolute 0.4 0 - 0.7 K/uL   Basophils Relative 1 %   Basophils Absolute 0.1 0 - 0.1 K/uL  Comprehensive metabolic panel  Result Value Ref Range   Sodium 139 135 - 145 mmol/L   Potassium 3.7 3.5 - 5.1 mmol/L   Chloride 107 101 - 111 mmol/L   CO2 23 22 - 32  mmol/L   Glucose, Bld 128 (H) 65 - 99 mg/dL   BUN 13 6 - 20 mg/dL   Creatinine, Ser 0.87 0.44 - 1.00 mg/dL   Calcium 9.3 8.9 - 10.3 mg/dL   Total Protein 7.2 6.5 - 8.1 g/dL   Albumin 4.3 3.5 - 5.0 g/dL   AST 23 15 - 41 U/L   ALT 20 14 - 54 U/L   Alkaline Phosphatase 69 38 - 126 U/L   Total Bilirubin 0.6 0.3 - 1.2 mg/dL   GFR calc non Af Amer >60 >60 mL/min   GFR calc Af Amer >60 >60 mL/min   Anion gap 9 5 - 15  Troponin I  Result Value Ref Range   Troponin I <0.03 <0.03 ng/mL      Assessment & Plan:   Problem List Items Addressed This Visit      Unprioritized   Vertigo    Continue with Meclizine.  Hopefully this will improve with sinus treatment.  Discussed Epley maneuvers and fluids       Other Visit Diagnoses    Acute non-recurrent maxillary sinusitis    -  Primary   New diagnosis.  Rx for Augmentin.  Continue fluids and Afrin for a limited period of time   Relevant Medications   amoxicillin-clavulanate (AUGMENTIN) 875-125 MG tablet       Follow up plan: Return if symptoms worsen or fail to improve.

## 2017-09-06 NOTE — Assessment & Plan Note (Signed)
Continue with Meclizine.  Hopefully this will improve with sinus treatment.  Discussed Epley maneuvers and fluids

## 2017-09-10 ENCOUNTER — Other Ambulatory Visit: Payer: Self-pay | Admitting: Unknown Physician Specialty

## 2017-09-10 MED ORDER — FENOFIBRATE 160 MG PO TABS: 160.0000 mg | ORAL_TABLET | Freq: Every day | ORAL | 2 refills | Status: DC

## 2017-09-10 NOTE — Telephone Encounter (Signed)
Routing to provider. Patient last seen for regular f/up 07/24/17 and has s/up scheduled for 01/21/2018.

## 2017-09-10 NOTE — Telephone Encounter (Signed)
[facility] sent refill request on patients Fenofibrate 160mg    [facility] [phone]  Thanks

## 2017-10-26 ENCOUNTER — Inpatient Hospital Stay
Admission: EM | Admit: 2017-10-26 | Discharge: 2017-11-01 | DRG: 065 | Disposition: A | Discharge status: Home Health | Payer: Medicare Other | Attending: Internal Medicine | Admitting: Internal Medicine

## 2017-10-26 ENCOUNTER — Emergency Department: Payer: Medicare Other

## 2017-10-26 ENCOUNTER — Other Ambulatory Visit: Payer: Self-pay

## 2017-10-26 ENCOUNTER — Encounter: Payer: Self-pay | Admitting: Emergency Medicine

## 2017-10-26 DIAGNOSIS — H6690 Otitis media, unspecified, unspecified ear: Secondary | ICD-10-CM | POA: Diagnosis present

## 2017-10-26 DIAGNOSIS — Z833 Family history of diabetes mellitus: Secondary | ICD-10-CM | POA: Diagnosis not present

## 2017-10-26 DIAGNOSIS — F202 Catatonic schizophrenia: Secondary | ICD-10-CM | POA: Diagnosis present

## 2017-10-26 DIAGNOSIS — Z9049 Acquired absence of other specified parts of digestive tract: Secondary | ICD-10-CM | POA: Diagnosis not present

## 2017-10-26 DIAGNOSIS — Z79899 Other long term (current) drug therapy: Secondary | ICD-10-CM

## 2017-10-26 DIAGNOSIS — I639 Cerebral infarction, unspecified: Secondary | ICD-10-CM | POA: Diagnosis present

## 2017-10-26 DIAGNOSIS — Z8249 Family history of ischemic heart disease and other diseases of the circulatory system: Secondary | ICD-10-CM | POA: Diagnosis not present

## 2017-10-26 DIAGNOSIS — Z8673 Personal history of transient ischemic attack (TIA), and cerebral infarction without residual deficits: Secondary | ICD-10-CM

## 2017-10-26 DIAGNOSIS — R1310 Dysphagia, unspecified: Secondary | ICD-10-CM | POA: Diagnosis present

## 2017-10-26 DIAGNOSIS — E785 Hyperlipidemia, unspecified: Secondary | ICD-10-CM | POA: Diagnosis present

## 2017-10-26 DIAGNOSIS — R1313 Dysphagia, pharyngeal phase: Secondary | ICD-10-CM | POA: Diagnosis present

## 2017-10-26 DIAGNOSIS — E162 Hypoglycemia, unspecified: Secondary | ICD-10-CM | POA: Diagnosis present

## 2017-10-26 DIAGNOSIS — R29704 NIHSS score 4: Secondary | ICD-10-CM | POA: Diagnosis present

## 2017-10-26 DIAGNOSIS — R0603 Acute respiratory distress: Secondary | ICD-10-CM | POA: Diagnosis not present

## 2017-10-26 DIAGNOSIS — R4701 Aphasia: Secondary | ICD-10-CM | POA: Diagnosis present

## 2017-10-26 DIAGNOSIS — R001 Bradycardia, unspecified: Secondary | ICD-10-CM | POA: Diagnosis present

## 2017-10-26 DIAGNOSIS — R4781 Slurred speech: Secondary | ICD-10-CM | POA: Diagnosis present

## 2017-10-26 DIAGNOSIS — F061 Catatonic disorder due to known physiological condition: Secondary | ICD-10-CM | POA: Diagnosis present

## 2017-10-26 DIAGNOSIS — Z7982 Long term (current) use of aspirin: Secondary | ICD-10-CM

## 2017-10-26 DIAGNOSIS — Z7951 Long term (current) use of inhaled steroids: Secondary | ICD-10-CM

## 2017-10-26 HISTORY — DX: Cerebral infarction, unspecified: I63.9

## 2017-10-26 HISTORY — DX: Dysphagia, unspecified: R13.10

## 2017-10-26 LAB — COMPREHENSIVE METABOLIC PANEL
ALK PHOS: 61 U/L (ref 38–126)
ALT: 25 U/L (ref 14–54)
AST: 26 U/L (ref 15–41)
Albumin: 4.6 g/dL (ref 3.5–5.0)
Anion gap: 7 (ref 5–15)
BUN: 15 mg/dL (ref 6–20)
CALCIUM: 9.5 mg/dL (ref 8.9–10.3)
CO2: 26 mmol/L (ref 22–32)
Chloride: 107 mmol/L (ref 101–111)
Creatinine, Ser: 0.87 mg/dL (ref 0.44–1.00)
Glucose, Bld: 120 mg/dL — ABNORMAL HIGH (ref 65–99)
Potassium: 4.1 mmol/L (ref 3.5–5.1)
Sodium: 140 mmol/L (ref 135–145)
TOTAL PROTEIN: 7.5 g/dL (ref 6.5–8.1)
Total Bilirubin: 0.8 mg/dL (ref 0.3–1.2)

## 2017-10-26 LAB — CBC
HEMATOCRIT: 45.7 % (ref 35.0–47.0)
HEMOGLOBIN: 15.4 g/dL (ref 12.0–16.0)
MCH: 29.4 pg (ref 26.0–34.0)
MCHC: 33.6 g/dL (ref 32.0–36.0)
MCV: 87.5 fL (ref 80.0–100.0)
Platelets: 283 10*3/uL (ref 150–440)
RBC: 5.22 MIL/uL — ABNORMAL HIGH (ref 3.80–5.20)
RDW: 13.4 % (ref 11.5–14.5)
WBC: 7.1 10*3/uL (ref 3.6–11.0)

## 2017-10-26 LAB — DIFFERENTIAL
BASOS PCT: 1 %
Basophils Absolute: 0.1 10*3/uL (ref 0–0.1)
EOS ABS: 0.2 10*3/uL (ref 0–0.7)
EOS PCT: 2 %
LYMPHS ABS: 1.4 10*3/uL (ref 1.0–3.6)
Lymphocytes Relative: 19 %
MONOS PCT: 6 %
Monocytes Absolute: 0.4 10*3/uL (ref 0.2–0.9)
Neutro Abs: 5.1 10*3/uL (ref 1.4–6.5)
Neutrophils Relative %: 72 %

## 2017-10-26 LAB — APTT: aPTT: 30 seconds (ref 24–36)

## 2017-10-26 LAB — GLUCOSE, CAPILLARY
Glucose-Capillary: 99 mg/dL (ref 65–99)
Glucose-Capillary: 129 mg/dL — ABNORMAL HIGH (ref 65–99)
Glucose-Capillary: 126 mg/dL — ABNORMAL HIGH (ref 65–99)

## 2017-10-26 LAB — PROTIME-INR
INR: 0.97
Prothrombin Time: 12.8 seconds (ref 11.4–15.2)

## 2017-10-26 LAB — TROPONIN I: Troponin I: <0.03 ng/mL (ref <0.03)

## 2017-10-26 MED ORDER — ACETAMINOPHEN 650 MG RE SUPP
650.0000 mg | Freq: Four times a day (QID) | RECTAL | Status: DC | PRN
Start: 2017-10-26 — End: 2017-11-01

## 2017-10-26 MED ORDER — FENOFIBRATE 160 MG PO TABS
160.0000 mg | ORAL_TABLET | Freq: Every day | ORAL | Status: DC
  Filled 2017-10-26 (×2): qty 1

## 2017-10-26 MED ORDER — ENOXAPARIN SODIUM 40 MG/0.4ML ~~LOC~~ SOLN
40.0000 mg | SUBCUTANEOUS | Status: DC
  Administered 2017-10-26 – 2017-10-31 (×6): 40 mg via SUBCUTANEOUS
  Filled 2017-10-26 (×6): qty 0.4

## 2017-10-26 MED ORDER — FLUTICASONE PROPIONATE 50 MCG/ACT NA SUSP
2.0000 | Freq: Two times a day (BID) | NASAL | Status: DC
  Administered 2017-10-27 – 2017-11-01 (×10): 2 via NASAL
  Filled 2017-10-26: qty 16

## 2017-10-26 MED ORDER — PRAVASTATIN SODIUM 20 MG PO TABS: 40.0000 mg | ORAL_TABLET | Freq: Every day | ORAL | Status: DC

## 2017-10-26 MED ORDER — DOCUSATE SODIUM 100 MG PO CAPS: 100.0000 mg | ORAL_CAPSULE | Freq: Two times a day (BID) | ORAL | Status: DC

## 2017-10-26 MED ORDER — ONDANSETRON HCL 4 MG PO TABS: 4.0000 mg | ORAL_TABLET | Freq: Four times a day (QID) | ORAL | Status: DC | PRN

## 2017-10-26 MED ORDER — ONDANSETRON HCL 4 MG/2ML IJ SOLN: 4.0000 mg | Freq: Four times a day (QID) | INTRAMUSCULAR | Status: DC | PRN

## 2017-10-26 MED ORDER — MECLIZINE HCL 25 MG PO TABS
25.0000 mg | ORAL_TABLET | Freq: Three times a day (TID) | ORAL | Status: DC | PRN
  Filled 2017-10-26: qty 1

## 2017-10-26 MED ORDER — METHYLPREDNISOLONE SODIUM SUCC 125 MG IJ SOLR
125.0000 mg | Freq: Once | INTRAMUSCULAR | Status: AC
  Administered 2017-10-26: 125 mg via INTRAVENOUS
  Filled 2017-10-26: qty 2

## 2017-10-26 MED ORDER — CLOPIDOGREL BISULFATE 75 MG PO TABS: 75.0000 mg | ORAL_TABLET | Freq: Every day | ORAL | Status: DC

## 2017-10-26 MED ORDER — SODIUM CHLORIDE 0.9 % IV SOLN
INTRAVENOUS | Status: DC
  Administered 2017-10-26 – 2017-10-27 (×2): via INTRAVENOUS

## 2017-10-26 MED ORDER — BISACODYL 10 MG RE SUPP: 10.0000 mg | Freq: Every day | RECTAL | Status: DC | PRN

## 2017-10-26 MED ORDER — ACETAMINOPHEN 325 MG PO TABS: 650.0000 mg | ORAL_TABLET | Freq: Four times a day (QID) | ORAL | Status: DC | PRN

## 2017-10-26 MED ORDER — PANTOPRAZOLE SODIUM 40 MG IV SOLR
40.0000 mg | Freq: Two times a day (BID) | INTRAVENOUS | Status: DC
  Administered 2017-10-26 – 2017-11-01 (×11): 40 mg via INTRAVENOUS
  Filled 2017-10-26 (×12): qty 40

## 2017-10-26 MED ORDER — LORATADINE 10 MG PO TABS: 10.0000 mg | ORAL_TABLET | Freq: Every day | ORAL | Status: DC

## 2017-10-26 MED ORDER — INSULIN ASPART 100 UNIT/ML ~~LOC~~ SOLN
0.0000 [IU] | SUBCUTANEOUS | Status: DC
  Administered 2017-10-26 – 2017-10-27 (×2): 1 [IU] via SUBCUTANEOUS
  Filled 2017-10-26 (×2): qty 1

## 2017-10-26 NOTE — ED Triage Notes (Signed)
Here for stroke like symptoms. Pt clearly states woke up prior to 5 am today and around 5 am started with dizziness and double vision. Hx vertigo did try meclizine.  Was able to drink initially and now cannot drink. Spitting secretions into bag. Significant right facial droop noted.

## 2017-10-26 NOTE — ED Notes (Signed)
Returned from MRI 

## 2017-10-26 NOTE — Progress Notes (Signed)
   10/26/17 1500  Clinical Encounter Type  Visited With Patient and family together  Visit Type Follow-up  [place] followed up with patient and spouse; both were appreciative of support, awaiting test results.  Encouraged them to have staff page chaplain as needed.

## 2017-10-26 NOTE — ED Notes (Signed)
Pt transported to [facility]

## 2017-10-26 NOTE — H&P (Signed)
History and Physical    CHESNIE CAPELL GXQ:[id number] DOB: 1951/03/01 DOA: 10/26/2017  Referring physician: Dr. Burlene Arnt PCP: Kathrine Haddock, NP  Specialists: none  Chief Complaint: vertigo with right facial weakness  HPI: Bethany Lara is a 67 y.o. female has a past medical history significant for HLD and allergies now with acute onset right facial weakness with vertigo and difficulty with speech and swallowing. 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 She is now admitted. No fever. No N/V/D. Denies CP or SOB. Denies HA.  Review of Systems: The patient denies anorexia, fever, weight loss,, vision loss, decreased hearing, hoarseness, chest pain, syncope, dyspnea on exertion, peripheral edema, balance deficits, hemoptysis, abdominal pain, melena, hematochezia, severe indigestion/heartburn, hematuria, incontinence, genital sores, muscle weakness, suspicious skin lesions, transient blindness, difficulty walking, depression, unusual weight change, abnormal bleeding, enlarged lymph nodes, angioedema, and breast masses.   Past Medical History:  Diagnosis Date  . Allergy   . Carpal tunnel syndrome   . Hyperlipidemia    Past Surgical History:  Procedure Laterality Date  . BUNIONECTOMY    . CARPAL TUNNEL RELEASE  11/14/2015  . CHOLECYSTECTOMY     Social History:  reports that  has never smoked. she has never used smokeless tobacco. She reports that she does not drink alcohol or use drugs.  Allergies  Allergen Reactions  . Zocor [Simvastatin] Other (See Comments)    Fatigue, legs achey     Family History  Problem Relation Age of Onset  . Diabetes Mother   . Hypertension Mother   . Dementia Mother   . Diabetes Father   . Heart disease Father   . Alzheimer's disease Father   . Glaucoma Father   . Colon cancer Father   . Breast cancer Maternal Aunt     Prior to Admission medications   Medication Sig Start Date End Date Taking? Authorizing Provider  aspirin 81  MG tablet Take 81 mg by mouth daily.   Yes [provider]  cetirizine (ZYRTEC) 10 MG tablet Take 1 tablet (10 mg total) by mouth daily. 05/24/17  Yes Volney American, PA-C  fenofibrate 160 MG tablet Take 1 tablet (160 mg total) by mouth daily. 09/10/17  Yes Kathrine Haddock, NP  fluticasone (FLONASE) 50 MCG/ACT nasal spray Place 2 sprays into both nostrils 2 (two) times daily. 05/24/17  Yes Volney American, PA-C  meclizine (ANTIVERT) 25 MG tablet Take 1 tablet (25 mg total) by mouth 3 (three) times daily as needed for dizziness. 05/24/17  Yes Volney American, PA-C  Multiple Vitamin (MULTIVITAMIN) tablet Take 1 tablet by mouth daily.   Yes [provider]  pravastatin (PRAVACHOL) 40 MG tablet TAKE 1 TABLET (40 MG TOTAL) BY MOUTH AT BEDTIME. 08/16/17  Yes Kathrine Haddock, NP  VITAMIN D, CHOLECALCIFEROL, PO Take 2,000 Units by mouth daily.   Yes [provider]  amoxicillin-clavulanate (AUGMENTIN) 875-125 MG tablet Take 1 tablet by mouth 2 (two) times daily. Patient not taking: Reported on 10/26/2017 09/06/17   Kathrine Haddock, NP  hydrOXYzine (VISTARIL) 50 MG capsule Take 1 capsule (50 mg total) by mouth 3 (three) times daily as needed. Patient not taking: Reported on 08/09/2017 06/20/16   Volney American, [place]   Physical Exam: Vitals:   10/26/17 1330 10/26/17 1501 10/26/17 1530 10/26/17 1553  BP: 132/70 132/69 130/65   Pulse: (!) 52 (!) 50 (!) 50   Resp: 17 18    Temp:  98.4 F (36.9 C)  TempSrc:      SpO2: 97% 96% 95%   Weight:    77.1 kg (170 lb)  Height:    5\' 1"  (1.549 m)     General:  No apparent distress, WDWN, [place]/AT  Eyes: PERRL, EOMI, no scleral icterus, conjunctiva clear  ENT: moist oropharynx without exudate, TM's benign, dentition good  Neck: supple, no lymphadenopathy. No bruits or thyromegaly  Cardiovascular: regular rate without MRG; 2+ peripheral pulses, no JVD, no peripheral edema  Respiratory: CTA biL, good air  movement without wheezing, rhonchi or crackled. Respiratory effort normal  Abdomen: soft, non tender to palpation, positive bowel sounds, no guarding, no rebound  Skin: no rashes or lesions  Musculoskeletal: normal bulk and tone, no joint swelling  Psychiatric: normal mood and affect, A&OX3  Neurologic: right facial droop noted, Motor strength 5/5 in all 4 groups with symmetric DTR's and non-focal sensory exam  Labs on Admission:  Basic Metabolic Panel: Recent Labs  Lab 10/26/17 1316  NA 140  K 4.1  CL 107  CO2 26  GLUCOSE 120*  BUN 15  CREATININE 0.87  CALCIUM 9.5   Liver Function Tests: Recent Labs  Lab 10/26/17 1316  AST 26  ALT 25  ALKPHOS 61  BILITOT 0.8  PROT 7.5  ALBUMIN 4.6   No results for input(s): LIPASE, AMYLASE in the last 168 hours. No results for input(s): AMMONIA in the last 168 hours. CBC: Recent Labs  Lab 10/26/17 1224  WBC 7.1  NEUTROABS 5.1  HGB 15.4  HCT 45.7  MCV 87.5  PLT 283   Cardiac Enzymes: Recent Labs  Lab 10/26/17 1224  TROPONINI <0.03    BNP (last 3 results) No results for input(s): BNP in the last 8760 hours.  ProBNP (last 3 results) No results for input(s): PROBNP in the last 8760 hours.  CBG: Recent Labs  Lab 10/26/17 1227  GLUCAP 99    Radiological Exams on Admission: Mr Brain Wo Contrast  Result Date: 10/26/2017 CLINICAL DATA:  Vertigo, right facial droop, and dysarthria. EXAM: MRI HEAD WITHOUT CONTRAST TECHNIQUE: Multiplanar, multiecho pulse sequences of the brain and surrounding structures were obtained without intravenous contrast. COMPARISON:  Head CT 10/26/2017 and MRI 08/31/2017 FINDINGS: Brain: There is a 9 x 4 mm acute infarct in the upper right lateral medulla. A few scattered punctate foci of cerebral white matter T2 hyperintensity are unchanged from the prior MRI and nonspecific. There is very mild cerebral atrophy. No intracranial hemorrhage, mass, midline shift, or extra-axial fluid collection is  identified. Vascular: Major intracranial arterial flow voids are preserved. T2 hyperintensity in the left sigmoid sinus and jugular bulb likely reflects slow flow. Skull and upper cervical spine: Small right frontal skull osteoma and benign appearing small left frontal scalp nodule, unchanged from the prior MRI. No suspicious marrow lesion. Sinuses/Orbits: Unremarkable orbits.  No significant sinus disease. Other: None. IMPRESSION: Small acute right lateral medullary infarct. Electronically Signed   By: Logan Bores M.D.   On: 10/26/2017 15:18   Ct Head Code Stroke Wo Contrast  Result Date: 10/26/2017 CLINICAL DATA:  Code stroke. Right facial droop, dizziness, slurred speech, and diplopia. EXAM: CT HEAD WITHOUT CONTRAST TECHNIQUE: Contiguous axial images were obtained from the base of the skull through the vertex without intravenous contrast. COMPARISON:  Brain MRI 08/31/2017 and CT 08/30/2017 FINDINGS: Brain: There is no evidence of acute infarct, intracranial hemorrhage, mass, midline shift, or extra-axial fluid collection. There is very mild cerebral atrophy. Vascular: Calcified atherosclerosis at the skull base,  including marked vertebral artery plaque. No hyperdense vessel. Skull: No fracture or suspicious osseous lesion. Small, sessile right frontal skull osteoma. Sinuses/Orbits: Paranasal sinuses and mastoid air cells are clear. Unremarkable orbits. Other: None. ASPECTS [facility] Stroke Program Early CT Score) - Ganglionic level infarction (caudate, lentiform nuclei, internal capsule, insula, M1-M3 cortex): 7 - Supraganglionic infarction (M4-M6 cortex): 3 Total score (0-10 with 10 being normal): 10 IMPRESSION: 1. No evidence of acute intracranial abnormality. 2. ASPECTS is 10. These results were called by telephone at the time of interpretation on 10/26/2017 at 12:27 pm to Dr. Charlotte Crumb , who verbally acknowledged these results. Electronically Signed   By: Logan Bores M.D.   On: 10/26/2017 12:28     EKG: Independently reviewed.  Assessment/Plan Principal Problem:   CVA (cerebrovascular accident) [facility]) Active Problems:   Hyperlipidemia   Bradycardia   Swallowing dysfunction   Will admit to floor and begin Plavix. NPO except ice chips/meds. Consult ST and PT and CSW. Order echo and carotids. Monitor on telemetry. Cosnult Neurology. Neuro checks q4h  Diet: NPO Fluids: NS@75  DVT Prophylaxis: Lovenox  Code Status: FULL  Family Communication: yes  Disposition Plan: home  Time spent: 50 min

## 2017-10-26 NOTE — ED Provider Notes (Addendum)
[facility] Emergency Department Provider Note  ____________________________________________   I have reviewed the triage vital signs and the nursing notes. Where available I have reviewed prior notes and, if possible and indicated, outside hospital notes.    HISTORY  Chief Complaint stroke sx    HPI Bethany Lara is a 67 y.o. female who presents today complaining of left facial droop, difficulty swallowing and vertigo.  Patient had similar symptoms with a negative MRI in early December.  She states she was in her normal state of health last night in a normal state of health when she woke up this morning around 5, she began to have vertigo, took a meclizine pill and then began to have R-sided facial droop and difficulty swallowing.  She denies any difficulty speaking, she denies headache shortness of breath nausea vomiting or any other weakness anywhere at this time.  She states that Started therefore shortly after 5 nothing makes it better nothing makes it worse, it is somewhat similar to presentation she states that she had in early December as noted above.   Past Medical History:  Diagnosis Date  . Allergy   . Carpal tunnel syndrome   . Hyperlipidemia     Patient Active Problem List   Diagnosis Date Noted  . Medication monitoring encounter 07/24/2017  . Bradycardia 05/20/2017  . Vertigo 05/20/2017  . Chronic kidney disease, stage 3 ([place]) 01/21/2017  . Metabolic syndrome 17/61/6073  . IFG (impaired fasting glucose) 01/20/2016  . Eczema 07/22/2015  . Hyperlipidemia 07/15/2015  . Allergic rhinitis 07/15/2015  . Carpal tunnel syndrome 07/15/2015    Past Surgical History:  Procedure Laterality Date  . BUNIONECTOMY    . CARPAL TUNNEL RELEASE  11/14/2015  . CHOLECYSTECTOMY      Prior to Admission medications   Medication Sig Start Date End Date Taking? Authorizing Provider  aspirin 81 MG tablet Take 81 mg by mouth daily.   Yes [provider]  cetirizine (ZYRTEC) 10 MG tablet Take 1 tablet (10 mg total) by mouth daily. 05/24/17  Yes Volney American, PA-C  fenofibrate 160 MG tablet Take 1 tablet (160 mg total) by mouth daily. 09/10/17  Yes Kathrine Haddock, NP  fluticasone (FLONASE) 50 MCG/ACT nasal spray Place 2 sprays into both nostrils 2 (two) times daily. 05/24/17  Yes Volney American, PA-C  meclizine (ANTIVERT) 25 MG tablet Take 1 tablet (25 mg total) by mouth 3 (three) times daily as needed for dizziness. 05/24/17  Yes Volney American, PA-C  Multiple Vitamin (MULTIVITAMIN) tablet Take 1 tablet by mouth daily.   Yes [provider]  pravastatin (PRAVACHOL) 40 MG tablet TAKE 1 TABLET (40 MG TOTAL) BY MOUTH AT BEDTIME. 08/16/17  Yes Kathrine Haddock, NP  VITAMIN D, CHOLECALCIFEROL, PO Take 2,000 Units by mouth daily.   Yes [provider]  amoxicillin-clavulanate (AUGMENTIN) 875-125 MG tablet Take 1 tablet by mouth 2 (two) times daily. Patient not taking: Reported on 10/26/2017 09/06/17   Kathrine Haddock, NP  hydrOXYzine (VISTARIL) 50 MG capsule Take 1 capsule (50 mg total) by mouth 3 (three) times daily as needed. Patient not taking: Reported on 08/09/2017 06/20/16   Volney American, PA-C    Allergies Zocor [simvastatin]  Family History  Problem Relation Age of Onset  . Diabetes Mother   . Hypertension Mother   . Dementia Mother   . Diabetes Father   . Heart disease Father   . Alzheimer's disease Father   . Glaucoma Father   .  Colon cancer Father   . Breast cancer Maternal Aunt     Social History Social History   Tobacco Use  . Smoking status: Never Smoker  . Smokeless tobacco: Never Used  Substance Use Topics  . Alcohol use: No    Alcohol/week: 0.0 oz  . Drug use: No    Review of Systems Constitutional: No fever/chills Eyes: No visual changes. ENT: No sore throat. No stiff neck no neck pain Cardiovascular: Denies chest pain. Respiratory: Denies shortness  of breath. Gastrointestinal:   no vomiting.  No diarrhea.  No constipation. Genitourinary: Negative for dysuria. Musculoskeletal: Negative lower extremity swelling Skin: Negative for rash. Neurological: Negative for severe headaches, focal weakness or numbness.   ____________________________________________   PHYSICAL EXAM:  VITAL SIGNS: ED Triage Vitals [10/26/17 1209]  Enc Vitals Group     BP 119/84     Pulse Rate 61     Resp 18     Temp 98 F (36.7 C)     Temp Source Oral     SpO2 97 %     Weight      Height      Head Circumference      Peak Flow      Pain Score      Pain Loc      Pain Edu?      Excl. in [place]?     Constitutional: Alert and oriented. Well appearing and in no acute distress. Eyes: Conjunctivae are normal Head: Atraumatic HEENT: No congestion/rhinnorhea. Mucous membranes are moist.  Oropharynx non-erythematous Neck:   Nontender with no meningismus, no masses, no stridor Cardiovascular: Normal rate, regular rhythm. Grossly normal heart sounds.  Good peripheral circulation. Respiratory: Normal respiratory effort.  No retractions. Lungs CTAB. Abdominal: Soft and nontender. No distention. No guarding no rebound Back:  There is no focal tenderness or step off.  there is no midline tenderness there are no lesions noted. there is no CVA tenderness Musculoskeletal: No lower extremity tenderness, no upper extremity tenderness. No joint effusions, no DVT signs strong distal pulses no edema Neurologic: Recent speech seems normal, she is having some difficult with her secretions after suction is provided to her she does much better.  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  Left side seems normal.  There is some degree of slight eye droop as well.  Extraocular muscles are intact vision intact to confrontation the rest of her  neurologic exam including finger to nose sensation strength etc. is reassuring Skin:  Skin is warm, dry and intact. No rash noted. Psychiatric: Mood and affect are normal. Speech and behavior are normal.  ____________________________________________   LABS (all labs ordered are listed, but only abnormal results are displayed)  Labs Reviewed  CBC - Abnormal; Notable for the following components:      Result Value   RBC 5.22 (*)    All other components within normal limits  PROTIME-INR  APTT  DIFFERENTIAL  GLUCOSE, CAPILLARY  COMPREHENSIVE METABOLIC PANEL  TROPONIN I  CBG MONITORING, ED    Pertinent labs  results that were available during my care of the patient were reviewed by me and considered in my medical decision making (see chart for details). ____________________________________________  EKG  I personally interpreted any EKGs ordered by me or triage Sinus bradycardia, left axis deviation no acute ST elevation or depression.  ____________________________________________  RADIOLOGY  Pertinent labs & imaging results that were available during my care of the patient were reviewed by me and considered in my medical decision making (see chart for details). If possible, patient and/or family made aware of any abnormal findings.  Ct Head Code Stroke Wo Contrast  Result Date: 10/26/2017 CLINICAL DATA:  Code stroke. Right facial droop, dizziness, slurred speech, and diplopia. EXAM: CT HEAD WITHOUT CONTRAST TECHNIQUE: Contiguous axial images were obtained from the base of the skull through the vertex without intravenous contrast. COMPARISON:  Brain MRI 08/31/2017 and CT 08/30/2017 FINDINGS: Brain: There is no evidence of acute infarct, intracranial hemorrhage, mass, midline shift, or extra-axial fluid collection. There is very mild cerebral atrophy. Vascular: Calcified atherosclerosis at the skull base, including marked vertebral artery plaque. No hyperdense vessel. Skull: No fracture  or suspicious osseous lesion. Small, sessile right frontal skull osteoma. Sinuses/Orbits: Paranasal sinuses and mastoid air cells are clear. Unremarkable orbits. Other: None. ASPECTS [facility] Stroke Program Early CT Score) - Ganglionic level infarction (caudate, lentiform nuclei, internal capsule, insula, M1-M3 cortex): 7 - Supraganglionic infarction (M4-M6 cortex): 3 Total score (0-10 with 10 being normal): 10 IMPRESSION: 1. No evidence of acute intracranial abnormality. 2. ASPECTS is 10. These results were called by telephone at the time of interpretation on 10/26/2017 at 12:27 pm to Dr. Charlotte Crumb , who verbally acknowledged these results. Electronically Signed   By: Logan Bores M.D.   On: 10/26/2017 12:28   ____________________________________________    PROCEDURES  Procedure(s) performed: None  Procedures  Critical Care performed: None  ____________________________________________   INITIAL IMPRESSION / ASSESSMENT AND PLAN / ED COURSE  Pertinent labs & imaging results that were available during my care of the patient were reviewed by me and considered in my medical decision making (see chart for details).  Patient here with a Bell's palsy-like presentation which she apparently has had before, discussed with neurology as a code stroke, they feel this is Bell's palsy they do however agree with MRI as a precaution given her age.  We will do so.  Patient is in no acute distress.  The concern would be for sending her home whether she can handle her secretions well.  We will give her steroids per neurology, and we are pending MRI.  Patient was not a candidate for TPA.  ----------------------------------------- 3:37 PM on 10/26/2017 ----------------------------------------- MRI does confirm presence of CVA, obviously patient out of the window for TPA, persistent symptoms are noted.  We will re-paged neurology to see if there is any further input, patient is admitted to the hospital I have  discussed with hospitalist doctors. Signed out at the end of my shift.      ____________________________________________   FINAL CLINICAL IMPRESSION(S) / ED DIAGNOSES  Final diagnoses:  None      This chart was dictated using voice recognition software.  Despite best efforts to proofread,  errors can occur which can change meaning.      Schuyler Amor, MD 10/26/17 1312    Schuyler Amor, MD 10/26/17 1539

## 2017-10-26 NOTE — Clinical Social Work Note (Signed)
CSW received consult for SNF placement. CSW will follow pending PT evaluation and recommendation.  Santiago Bumpers, MSW, Latanya Presser [phone]

## 2017-10-26 NOTE — ED Notes (Signed)
To CT with RN.

## 2017-10-26 NOTE — ED Notes (Signed)
Pt transported by wheelchair to MRI

## 2017-10-26 NOTE — Progress Notes (Signed)
   10/26/17 1217  Clinical Encounter Type  Visited With Patient;Other (Comment) (spouse and medical team present)  Visit Type Code  Referral From [facility] responded to code stroke, arrived while team was treating patient.  Chaplain waited outside per team request.  Chaplain maintained pastoral presence, offered silent prayer for patient, spouse, and team.  Chaplain offered support to patient and spouse; they are open to ongoing support.

## 2017-10-26 NOTE — Consult Note (Signed)
[facility] TeleNeurology Consult Services  Asked to see this patient in telemedicine consultation. Consultation was performed with assistance of ancillary/medical staff at bedside.  Comments: Last Known normal [address] Time: 3614 [facility] Contacted: 4315 [facility] first log in: 1233 NIHSS assessment time: 1237 Needle Time: no iv tpa Call back time: 1238  HPI:  20 yof with hx of vertigo woke up this am feeling fine and after 5 am she developed intense vertigo and R facial droop with dysarthria. She denies any focal weakness or numbness of her arms or legs.  She denies any vision loss or facial numbness.  CT scan head was negative for acute process.  VSS  Gen Wn/Wd in Nad  TeleStroke Assessment: LOC:   0 LOC questions:  0 LOC Commands :   0 Gaze : 0 Visual fields :  0  Facial movements : 2 Upper limb Motor  0 Lower limb Motor  0 Limb Coordination  - 0 Sensory -  - 0 Language -  0 Speech -   1 Neglect / extinction -  0  NIHSS Score: 3    IMPRESSION  R Bells Palsy Hx of vertigo  Medical Decision Making:   Patient is not candidate for alteplase due to peripheral etiology of sxs and onset greater than 4.5hrs.  Not an IR candidate as low clinical suspicion for LVO by neurologic assessment.   Recommendations: - Recommend steroid therapy for Bells Palsy,  - d/t dysphagia recommend admission for MRI brain t/o evaluate and r/o for brainstem process - Further work will be deferred to [facility] [place] neurology service -  Needs Inpatient Neurology consultation and follow up  - Thank you for allowing [place] to participate in the care of your patient, if there are any questions please don't hesitate to contact us  Discussed plan of care with patient/hospital staff   Physician: Sylvan Cheese, DO   [facility]

## 2017-10-26 NOTE — ED Notes (Signed)
Suction set up, d/t pt unable to swallow at this time. Tele Neuro informed pt and family that he thinks the patient has Bells Palsy.

## 2017-10-27 ENCOUNTER — Inpatient Hospital Stay: Payer: Medicare Other

## 2017-10-27 DIAGNOSIS — R0603 Acute respiratory distress: Secondary | ICD-10-CM

## 2017-10-27 DIAGNOSIS — I639 Cerebral infarction, unspecified: Secondary | ICD-10-CM

## 2017-10-27 DIAGNOSIS — F061 Catatonic disorder due to known physiological condition: Secondary | ICD-10-CM | POA: Diagnosis present

## 2017-10-27 LAB — CBC
HEMATOCRIT: 42.4 % (ref 35.0–47.0)
Hemoglobin: 14.5 g/dL (ref 12.0–16.0)
MCH: 30 pg (ref 26.0–34.0)
MCHC: 34.3 g/dL (ref 32.0–36.0)
MCV: 87.5 fL (ref 80.0–100.0)
Platelets: 301 10*3/uL (ref 150–440)
RBC: 4.84 MIL/uL (ref 3.80–5.20)
RDW: 13.5 % (ref 11.5–14.5)
WBC: 10.2 10*3/uL (ref 3.6–11.0)

## 2017-10-27 LAB — COMPREHENSIVE METABOLIC PANEL
ALBUMIN: 4.1 g/dL (ref 3.5–5.0)
ALK PHOS: 55 U/L (ref 38–126)
ALT: 23 U/L (ref 14–54)
AST: 23 U/L (ref 15–41)
Anion gap: 10 (ref 5–15)
BILIRUBIN TOTAL: 0.6 mg/dL (ref 0.3–1.2)
BUN: 14 mg/dL (ref 6–20)
CALCIUM: 9.2 mg/dL (ref 8.9–10.3)
CO2: 21 mmol/L — AB (ref 22–32)
Chloride: 109 mmol/L (ref 101–111)
Creatinine, Ser: 0.85 mg/dL (ref 0.44–1.00)
GFR calc Af Amer: >60 mL/min (ref >60)
GFR calc non Af Amer: >60 mL/min (ref >60)
GLUCOSE: 118 mg/dL — AB (ref 65–99)
Potassium: 3.8 mmol/L (ref 3.5–5.1)
SODIUM: 140 mmol/L (ref 135–145)
TOTAL PROTEIN: 7 g/dL (ref 6.5–8.1)

## 2017-10-27 LAB — GLUCOSE, CAPILLARY
GLUCOSE-CAPILLARY: 118 mg/dL — AB (ref 65–99)
GLUCOSE-CAPILLARY: 89 mg/dL (ref 65–99)
GLUCOSE-CAPILLARY: 75 mg/dL (ref 65–99)
GLUCOSE-CAPILLARY: 101 mg/dL — AB (ref 65–99)
GLUCOSE-CAPILLARY: 150 mg/dL — AB (ref 65–99)
Glucose-Capillary: 126 mg/dL — ABNORMAL HIGH (ref 65–99)
Glucose-Capillary: 89 mg/dL (ref 65–99)

## 2017-10-27 LAB — LIPID PANEL
CHOL/HDL RATIO: 3.1 ratio
Cholesterol: 190 mg/dL (ref 0–200)
HDL: 61 mg/dL (ref >40)
LDL CALC: 99 mg/dL (ref 0–99)
Triglycerides: 150 mg/dL — ABNORMAL HIGH (ref <150)
VLDL: 30 mg/dL (ref 0–40)

## 2017-10-27 LAB — HEMOGLOBIN A1C
HEMOGLOBIN A1C: 5.5 % (ref 4.8–5.6)
Mean Plasma Glucose: 111.15 mg/dL

## 2017-10-27 LAB — MRSA PCR SCREENING: MRSA BY PCR: NEGATIVE

## 2017-10-27 MED ORDER — RACEPINEPHRINE HCL 2.25 % IN NEBU
0.5000 mL | INHALATION_SOLUTION | Freq: Once | RESPIRATORY_TRACT | Status: AC
  Administered 2017-10-27: 21:00:00 0.5 mL via RESPIRATORY_TRACT
  Filled 2017-10-27: qty 0.5

## 2017-10-27 MED ORDER — DEXTROSE-NACL 5-0.9 % IV SOLN
INTRAVENOUS | Status: DC
  Administered 2017-10-27: 12:00:00 via INTRAVENOUS

## 2017-10-27 MED ORDER — SODIUM CHLORIDE 0.9 % IV SOLN
3.0000 g | Freq: Four times a day (QID) | INTRAVENOUS | Status: DC
  Administered 2017-10-27 – 2017-10-30 (×12): 3 g via INTRAVENOUS
  Filled 2017-10-27 (×15): qty 3

## 2017-10-27 MED ORDER — ASPIRIN 300 MG RE SUPP
300.0000 mg | Freq: Every day | RECTAL | Status: DC
Start: 2017-10-27 — End: 2017-10-31
  Administered 2017-10-27 – 2017-10-31 (×5): 300 mg via RECTAL
  Filled 2017-10-27 (×5): qty 1

## 2017-10-27 MED ORDER — HYDROCODONE-ACETAMINOPHEN 5-325 MG PO TABS: 1.0000 | ORAL_TABLET | ORAL | Status: DC | PRN

## 2017-10-27 MED ORDER — HEPARIN SODIUM (PORCINE) 5000 UNIT/ML IJ SOLN: 5000.0000 [IU] | Freq: Three times a day (TID) | INTRAMUSCULAR | Status: DC

## 2017-10-27 MED ORDER — DOCUSATE SODIUM 100 MG PO CAPS
100.0000 mg | ORAL_CAPSULE | Freq: Two times a day (BID) | ORAL | Status: DC
  Filled 2017-10-27: qty 1

## 2017-10-27 MED ORDER — TRAZODONE HCL 50 MG PO TABS: 25.0000 mg | ORAL_TABLET | Freq: Every evening | ORAL | Status: DC | PRN

## 2017-10-27 MED ORDER — ONDANSETRON HCL 4 MG/2ML IJ SOLN
4.0000 mg | Freq: Four times a day (QID) | INTRAMUSCULAR | Status: DC | PRN
Start: 2017-10-27 — End: 2017-10-28

## 2017-10-27 MED ORDER — BISACODYL 5 MG PO TBEC: 5.0000 mg | DELAYED_RELEASE_TABLET | Freq: Every day | ORAL | Status: DC | PRN

## 2017-10-27 MED ORDER — ACETAMINOPHEN 325 MG PO TABS: 650.0000 mg | ORAL_TABLET | Freq: Four times a day (QID) | ORAL | Status: DC | PRN

## 2017-10-27 MED ORDER — FUROSEMIDE 10 MG/ML IJ SOLN
40.0000 mg | Freq: Once | INTRAMUSCULAR | Status: AC
  Administered 2017-10-27: 40 mg via INTRAVENOUS

## 2017-10-27 MED ORDER — FUROSEMIDE 10 MG/ML IJ SOLN
INTRAMUSCULAR | Status: AC
  Administered 2017-10-27: 21:00:00 40 mg via INTRAVENOUS
  Filled 2017-10-27: qty 4

## 2017-10-27 MED ORDER — METHYLPREDNISOLONE SODIUM SUCC 125 MG IJ SOLR
80.0000 mg | Freq: Once | INTRAMUSCULAR | Status: AC
  Administered 2017-10-27: 21:00:00 80 mg via INTRAVENOUS
  Filled 2017-10-27: qty 2

## 2017-10-27 MED ORDER — DEXTROSE 10 % IV SOLN
INTRAVENOUS | Status: DC
  Administered 2017-10-27: 23:00:00 via INTRAVENOUS

## 2017-10-27 MED ORDER — ONDANSETRON HCL 4 MG PO TABS: 4.0000 mg | ORAL_TABLET | Freq: Four times a day (QID) | ORAL | Status: DC | PRN

## 2017-10-27 MED ORDER — ACETAMINOPHEN 650 MG RE SUPP: 650.0000 mg | Freq: Four times a day (QID) | RECTAL | Status: DC | PRN

## 2017-10-27 NOTE — Consult Note (Signed)
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 Patient received a dose of solumedrol, racepinephrine and lasix. Her CXR is negative. A repeat CT head shows no hemorrhagic transformation. She is back to baseline and offers no complaints except for speech and swallowing difficulties  PAST MEDICAL HISTORY :  She  has a past medical history of Allergy, Carpal tunnel syndrome, and Hyperlipidemia.  PAST SURGICAL HISTORY: She  has a past surgical history that includes Cholecystectomy; Bunionectomy; and Carpal tunnel release (11/14/2015).  Allergies  Allergen Reactions  . Zocor [Simvastatin] Other (See Comments)    Fatigue, legs achey     No current facility-administered medications on file prior to encounter.    Current Outpatient Medications on File Prior to Encounter  Medication Sig  . aspirin 81 MG tablet Take 81 mg by mouth daily.  . cetirizine (ZYRTEC) 10 MG tablet Take 1 tablet (10 mg total) by mouth daily.  . fenofibrate 160 MG tablet Take 1 tablet (160 mg total) by mouth daily.  . fluticasone (FLONASE) 50 MCG/ACT nasal spray Place 2 sprays into both nostrils 2 (two) times daily.  . meclizine (ANTIVERT) 25 MG tablet Take 1 tablet (25 mg total) by mouth 3 (three) times daily as needed for dizziness.  . Multiple Vitamin (MULTIVITAMIN) tablet Take 1 tablet by mouth daily.  . pravastatin (PRAVACHOL) 40 MG tablet TAKE 1 TABLET (40 MG TOTAL) BY MOUTH AT BEDTIME.  Marland Kitchen VITAMIN D, CHOLECALCIFEROL, PO Take 2,000 Units by mouth daily.  Marland Kitchen amoxicillin-clavulanate (AUGMENTIN)  875-125 MG tablet Take 1 tablet by mouth 2 (two) times daily. (Patient not taking: Reported on 10/26/2017)  . hydrOXYzine (VISTARIL) 50 MG capsule Take 1 capsule (50 mg total) by mouth 3 (three) times daily as needed. (Patient not taking: Reported on 08/09/2017)    FAMILY HISTORY:  Her indicated that her mother is alive. She indicated that her father is deceased. She indicated that her brother is deceased. She indicated that her maternal grandmother is deceased. She indicated that her maternal grandfather is deceased. She indicated that her paternal grandmother is deceased. She indicated that her paternal grandfather is deceased. She indicated that her daughter is alive. She indicated that her son is alive. She indicated that the status of her maternal aunt is unknown.   SOCIAL HISTORY: She  reports that  has never smoked. she has never used smokeless tobacco. She reports that she does not drink alcohol or use drugs.  REVIEW OF SYSTEMS:   Constitutional: Negative for fever and chills.  HENT: Negative for congestion and rhinorrhea.  Eyes: Negative for redness and visual disturbance.  Respiratory: Negative for shortness of breath and wheezing.  Cardiovascular: Negative for chest pain and palpitations.  Gastrointestinal: Negative  for nausea , vomiting and abdominal pain and  Loose stools Genitourinary: Negative for dysuria and urgency.  Endocrine: Denies polyuria, polyphagia and heat intolerance Musculoskeletal: Negative for myalgias and arthralgias.  Skin: Negative for pallor and wound.  Neurological: Negative for dizziness and headaches but reports speech difficulty   SUBJECTIVE:   VITAL SIGNS: BP (!) 152/64 (BP Location: Left  Arm)   Pulse (!) 52   Temp 97.9 F (36.6 C)   Resp 16   Ht 5\' 1"  (1.549 m)   Wt 77.1 kg (170 lb)   LMP  (LMP Unknown)   SpO2 98%   BMI 32.12 kg/m   HEMODYNAMICS:    VENTILATOR SETTINGS:    INTAKE / OUTPUT: I/O last 3 completed shifts: In: 755  [I.V.:755] Out: -   PHYSICAL EXAMINATION: General: NAD Neuro:  AAO X4, moves all extremities, equal grip strength, speech is slurred HEENT: PERRLA, oral cavity pink with no lesions, trachea midline Cardiovascular: RRR, S1/S2, no MRG, +2 pulses, no edema Lungs: CTAB Abdomen:  Non-distended, +BS Musculoskeletal:  +ROM, no deformities Skin: warm and dry  LABS:  BMET Recent Labs  Lab 10/26/17 1316 10/27/17 0631  NA 140 140  K 4.1 3.8  CL 107 109  CO2 26 21*  BUN 15 14  CREATININE 0.87 0.85  GLUCOSE 120* 118*    Electrolytes Recent Labs  Lab 10/26/17 1316 10/27/17 0631  CALCIUM 9.5 9.2    CBC Recent Labs  Lab 10/26/17 1224 10/27/17 0631  WBC 7.1 10.2  HGB 15.4 14.5  HCT 45.7 42.4  PLT 283 301    Coag's Recent Labs  Lab 10/26/17 1224  APTT 30  INR 0.97    Sepsis Markers No results for input(s): LATICACIDVEN, PROCALCITON, O2SATVEN in the last 168 hours.  ABG No results for input(s): PHART, PCO2ART, PO2ART in the last 168 hours.  Liver Enzymes Recent Labs  Lab 10/26/17 1316 10/27/17 0631  AST 26 23  ALT 25 23  ALKPHOS 61 55  BILITOT 0.8 0.6  ALBUMIN 4.6 4.1    Cardiac Enzymes Recent Labs  Lab 10/26/17 1224  TROPONINI <0.03    Glucose Recent Labs  Lab 10/26/17 2102 10/27/17 0009 10/27/17 0412 10/27/17 0801 10/27/17 1240 10/27/17 1710  GLUCAP 126* 126* 118* 89 89 75    Imaging Dg Chest Port 1 View  Result Date: 10/27/2017 CLINICAL DATA:  Acute respiratory failure EXAM: PORTABLE CHEST 1 VIEW COMPARISON:  April 23, 2009 FINDINGS: The heart size and mediastinal contours are within normal limits. Both lungs are clear. The visualized skeletal structures are unremarkable. IMPRESSION: No active disease. Electronically Signed   By: Dorise Bullion III M.D   On: 10/27/2017 21:44   STUDIES:  Echo pending  CULTURES: none  ANTIBIOTICS: Unasyn for aspiration  SIGNIFICANT EVENTS: 2/2>admitted 2/3>Transferred too the  ICU  LINES/TUBES: PIVs Foley  DISCUSSION: 67 y/o female with acute CVA with residual dysphagia and expressive aphasia being transferred to the ICU for acute respiratory distress. 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     Arlyss Gandy. [facility] ANP-BC Pulmonary and Critical Care Medicine [facility] Pager [phone] or [phone]  NB: This document was prepared using Dragon voice  recognition software and may include unintentional dictation errors.    10/27/2017, 9:54 PM

## 2017-10-27 NOTE — Progress Notes (Signed)
Responded to Rapid Response page. Pt on room air with O2 saturations in mid 80's. Audible stridor and rapid respirations. Pt placed on 4L of oxygen and titrated to 2L [place]. O2 saturations 100%. Pt orally suctioned and given racemic epinephrine treatment. Pt tolerated treatment well. Respirations and stridor both improved. PT taken to CT then to CCU

## 2017-10-27 NOTE — Progress Notes (Signed)
This nurse responded to pt room after central tele called the blue phone stating pt was in asystole. Upon entering the pt room this nurse found pt in respiratory distress. A rapid responses was called. MD was called and pt was moved to stepdown for further monitoring.

## 2017-10-27 NOTE — Significant Event (Signed)
Rapid Response Event Note  Overview: Time Called: 2042 Arrival Time: 2045 Event Type: Respiratory  Initial Focused Assessment:   On entry to room, observed pt.'s labored breathing and audible stridor.  K.W., RT present and had placed pt. On 4 L [place]- pt. O2 Sat: 100%, but increased WOB and unable to manage secretions per care RN. Care RN Silva Bandy present and A.P., [facility] present. Auscultated stridor in upper airway, no crackles audible. Pt. Alert and oriented, able to follow commands, no discernible neuro changes.  Pt. C/o sudden new onset headache on top of head towards to back of the head.  Interventions: Dr. Lucky Rathke was alerted to changes. STAT CXR & head CT w/o contrast Solumedrol IV, lasix IV and epi neb treatment ordered Pt. Transferred to ICU as Stepdown pt.   Event Summary: Medications ordered given on 1C and CXR done before Rapid response RN transported pt. To CT and then to ICU  Event end at 2147  [place]

## 2017-10-27 NOTE — Consult Note (Signed)
[facility] Cardiology  CARDIOLOGY CONSULT NOTE  Patient ID: Bethany Lara MRN: [id number] DOB/AGE: [birth year] 67 y.o.  Admit date: 10/26/2017 Referring Physician Leslye Peer Primary Physician [facility] Primary Cardiologist  Reason for Consultation bradycardia  HPI: 67 year old female referred for evaluation of bradycardia.  The patient's had a recent history of vertigo.  She presents to [facility] emergency room with worsening vertigo, right facial weakness, difficulty with speech and swallowing.  MRI revealed acute medullary CVA.  The patient was started on Plavix.  Admission ECG revealed sinus bradycardia rate of 58 bpm.  During the hospitalization, patient had intermittent episodes of low heart rates in the 30s and 40s while sleeping.  The patient reports a history of bradycardia, denies presyncope or syncope.  Patient remains hemodynamically stable.  Telemetry reveals sinus bradycardia rate of 59 bpm.  Review of systems complete and found to be negative unless listed above     Past Medical History:  Diagnosis Date  . Allergy   . Carpal tunnel syndrome   . Hyperlipidemia     Past Surgical History:  Procedure Laterality Date  . BUNIONECTOMY    . CARPAL TUNNEL RELEASE  11/14/2015  . CHOLECYSTECTOMY      Medications Prior to Admission  Medication Sig Dispense Refill Last Dose  . aspirin 81 MG tablet Take 81 mg by mouth daily.   10/25/2017 at Unknown time  . cetirizine (ZYRTEC) 10 MG tablet Take 1 tablet (10 mg total) by mouth daily. 30 tablet 11 10/25/2017 at Unknown time  . fenofibrate 160 MG tablet Take 1 tablet (160 mg total) by mouth daily. 90 tablet 2 10/25/2017 at Unknown time  . fluticasone (FLONASE) 50 MCG/ACT nasal spray Place 2 sprays into both nostrils 2 (two) times daily. 16 g 11 10/25/2017 at Unknown time  . meclizine (ANTIVERT) 25 MG tablet Take 1 tablet (25 mg total) by mouth 3 (three) times daily as needed for dizziness. 30 tablet 0 10/26/2017 at Unknown time  . Multiple Vitamin  (MULTIVITAMIN) tablet Take 1 tablet by mouth daily.   10/25/2017 at Unknown time  . pravastatin (PRAVACHOL) 40 MG tablet TAKE 1 TABLET (40 MG TOTAL) BY MOUTH AT BEDTIME. 90 tablet 3 10/25/2017 at Unknown time  . VITAMIN D, CHOLECALCIFEROL, PO Take 2,000 Units by mouth daily.   10/25/2017 at Unknown time  . amoxicillin-clavulanate (AUGMENTIN) 875-125 MG tablet Take 1 tablet by mouth 2 (two) times daily. (Patient not taking: Reported on 10/26/2017) 20 tablet 0 Not Taking at Unknown time  . hydrOXYzine (VISTARIL) 50 MG capsule Take 1 capsule (50 mg total) by mouth 3 (three) times daily as needed. (Patient not taking: Reported on 08/09/2017) 30 capsule 2 Not Taking at Unknown time   Social History   Socioeconomic History  . Marital status: Married    Spouse name: Not on file  . Number of children: Not on file  . Years of education: Not on file  . Highest education level: Not on file  Social Needs  . Financial resource strain: Not on file  . Food insecurity - worry: Not on file  . Food insecurity - inability: Not on file  . Transportation needs - medical: Not on file  . Transportation needs - non-medical: Not on file  Occupational History  . Not on file  Tobacco Use  . Smoking status: Never Smoker  . Smokeless tobacco: Never Used  Substance and Sexual Activity  . Alcohol use: No    Alcohol/week: 0.0 oz  . Drug use: No  . Sexual  activity: Yes  Other Topics Concern  . Not on file  Social History Narrative  . Not on file    Family History  Problem Relation Age of Onset  . Diabetes Mother   . Hypertension Mother   . Dementia Mother   . Diabetes Father   . Heart disease Father   . Alzheimer's disease Father   . Glaucoma Father   . Colon cancer Father   . Breast cancer Maternal Aunt       Review of systems complete and found to be negative unless listed above      PHYSICAL EXAM  General: Well developed, well nourished, in no acute distress HEENT:  Normocephalic and  atramatic Neck:  No JVD.  Lungs: Clear bilaterally to auscultation and percussion. Heart: HRRR . Normal S1 and S2 without gallops or murmurs.  Abdomen: Bowel sounds are positive, abdomen soft and non-tender  Msk:  Back normal, normal gait. Normal strength and tone for age. Extremities: No clubbing, cyanosis or edema.   Neuro: Alert and oriented X 3. Psych:  Good affect, responds appropriately  Labs:   Lab Results  Component Value Date   WBC 10.2 10/27/2017   HGB 14.5 10/27/2017   HCT 42.4 10/27/2017   MCV 87.5 10/27/2017   PLT 301 10/27/2017    Recent Labs  Lab 10/27/17 0631  NA 140  K 3.8  CL 109  CO2 21*  BUN 14  CREATININE 0.85  CALCIUM 9.2  PROT 7.0  BILITOT 0.6  ALKPHOS 55  ALT 23  AST 23  GLUCOSE 118*   Lab Results  Component Value Date   TROPONINI <0.03 10/26/2017    Lab Results  Component Value Date   CHOL 190 10/27/2017   CHOL 211 (H) 07/24/2017   CHOL 190 05/21/2017   Lab Results  Component Value Date   HDL 61 10/27/2017   HDL 53 07/24/2017   HDL 45 05/21/2017   Lab Results  Component Value Date   LDLCALC 99 10/27/2017   LDLCALC 115 (H) 07/24/2017   LDLCALC 92 05/21/2017   Lab Results  Component Value Date   TRIG 150 (H) 10/27/2017   TRIG 217 (H) 07/24/2017   TRIG 264 (H) 05/21/2017   Lab Results  Component Value Date   CHOLHDL 3.1 10/27/2017   CHOLHDL 4.2 05/21/2017   No results found for: LDLDIRECT    Radiology: Mr Brain Wo Contrast  Result Date: 10/26/2017 CLINICAL DATA:  Vertigo, right facial droop, and dysarthria. EXAM: MRI HEAD WITHOUT CONTRAST TECHNIQUE: Multiplanar, multiecho pulse sequences of the brain and surrounding structures were obtained without intravenous contrast. COMPARISON:  Head CT 10/26/2017 and MRI 08/31/2017 FINDINGS: Brain: There is a 9 x 4 mm acute infarct in the upper right lateral medulla. A few scattered punctate foci of cerebral white matter T2 hyperintensity are unchanged from the prior MRI and  nonspecific. There is very mild cerebral atrophy. No intracranial hemorrhage, mass, midline shift, or extra-axial fluid collection is identified. Vascular: Major intracranial arterial flow voids are preserved. T2 hyperintensity in the left sigmoid sinus and jugular bulb likely reflects slow flow. Skull and upper cervical spine: Small right frontal skull osteoma and benign appearing small left frontal scalp nodule, unchanged from the prior MRI. No suspicious marrow lesion. Sinuses/Orbits: Unremarkable orbits.  No significant sinus disease. Other: None. IMPRESSION: Small acute right lateral medullary infarct. Electronically Signed   By: Logan Bores M.D.   On: 10/26/2017 15:18   US Carotid Bilateral  Result Date: 10/26/2017  CLINICAL DATA:  67 year old presenting with acute onset of diplopia and dizziness, current history of hyperlipidemia, with an acute small right lateral medullary infarct on MRI earlier today. EXAM: BILATERAL CAROTID DUPLEX ULTRASOUND TECHNIQUE: Pearline Cables scale imaging, color Doppler and duplex ultrasound were performed of bilateral carotid and vertebral arteries in the neck. COMPARISON:  None. FINDINGS: Criteria: Quantification of carotid stenosis is based on velocity parameters that correlate the residual internal carotid diameter with NASCET-based stenosis levels, using the diameter of the distal internal carotid lumen as the denominator for stenosis measurement. The following velocity measurements were obtained: RIGHT ICA:  63/16 cm/sec CCA:  46/65 cm/sec SYSTOLIC ICA/CCA RATIO:  0.9 DIASTOLIC ICA/CCA RATIO:  1.6 ECA:  131/12 cm/sec LEFT ICA:  86/17 cm/sec CCA:  99/3 cm/sec SYSTOLIC ICA/CCA RATIO:  0.9 DIASTOLIC ICA/CCA RATIO:  2.2 ECA:  118/5 cm/sec RIGHT CAROTID ARTERY: Tortuous CCA with mild intimal thickening. No visible calcified or noncalcified plaque in the CCA, ICA or ECA. RIGHT VERTEBRAL ARTERY:  Antegrade flow. LEFT CAROTID ARTERY: Mild intimal thickening involving the CCA. No visible  calcified or noncalcified plaque in the CCA, ICA or ECA. LEFT VERTEBRAL ARTERY:  Antegrade flow. IMPRESSION: 1. No evidence of hemodynamically significant stenosis involving either the right or left carotid circulation in the neck. 2. Antegrade flow in both vertebral arteries. Electronically Signed   By: Evangeline Dakin M.D.   On: 10/26/2017 17:30   Ct Head Code Stroke Wo Contrast  Result Date: 10/26/2017 CLINICAL DATA:  Code stroke. Right facial droop, dizziness, slurred speech, and diplopia. EXAM: CT HEAD WITHOUT CONTRAST TECHNIQUE: Contiguous axial images were obtained from the base of the skull through the vertex without intravenous contrast. COMPARISON:  Brain MRI 08/31/2017 and CT 08/30/2017 FINDINGS: Brain: There is no evidence of acute infarct, intracranial hemorrhage, mass, midline shift, or extra-axial fluid collection. There is very mild cerebral atrophy. Vascular: Calcified atherosclerosis at the skull base, including marked vertebral artery plaque. No hyperdense vessel. Skull: No fracture or suspicious osseous lesion. Small, sessile right frontal skull osteoma. Sinuses/Orbits: Paranasal sinuses and mastoid air cells are clear. Unremarkable orbits. Other: None. ASPECTS [facility] Stroke Program Early CT Score) - Ganglionic level infarction (caudate, lentiform nuclei, internal capsule, insula, M1-M3 cortex): 7 - Supraganglionic infarction (M4-M6 cortex): 3 Total score (0-10 with 10 being normal): 10 IMPRESSION: 1. No evidence of acute intracranial abnormality. 2. ASPECTS is 10. These results were called by telephone at the time of interpretation on 10/26/2017 at 12:27 pm to Dr. Charlotte Crumb , who verbally acknowledged these results. Electronically Signed   By: Logan Bores M.D.   On: 10/26/2017 12:28    EKG: Sinus bradycardia 58 bpm  ASSESSMENT AND PLAN:   1.  Sinus bradycardia, of uncertain clinical significance, appears clinically and hemodynamically stable 2.  Acute medullary CVA on  Plavix  Recommendations  1.  Agree with current therapy 2.  Continue to monitor on telemetry 3.  Review 2D echocardiogram  Signed: Isaias Cowman MD,PhD, [facility] 10/27/2017, 10:51 AM

## 2017-10-27 NOTE — Progress Notes (Signed)
ANTIBIOTIC CONSULT NOTE - INITIAL  Pharmacy Consult for Unasyn Indication: Otitis media and failed swallow eval  Allergies  Allergen Reactions  . Zocor [Simvastatin] Other (See Comments)    Fatigue, legs achey     Patient Measurements: Height: 5\' 1"  (154.9 cm) Weight: 170 lb (77.1 kg) IBW/kg (Calculated) : 47.8 Adjusted Body Weight:   Vital Signs: Temp: 97.8 F (36.6 C) (02/03 0852) Temp Source: Axillary (02/03 0852) BP: 143/67 (02/03 0852) Pulse Rate: 57 (02/03 0852) Intake/Output from previous day: 02/02 0701 - 02/03 0700 In: 755 [I.V.:755] Out: -  Intake/Output from this shift: No intake/output data recorded.  Labs: Recent Labs    10/26/17 1224 10/26/17 1316 10/27/17 0631  WBC 7.1  --  10.2  HGB 15.4  --  14.5  PLT 283  --  301  CREATININE  --  0.87 0.85   Estimated Creatinine Clearance: 59.5 mL/min (by C-G formula based on SCr of 0.85 mg/dL). No results for input(s): VANCOTROUGH, VANCOPEAK, VANCORANDOM, GENTTROUGH, GENTPEAK, GENTRANDOM, TOBRATROUGH, TOBRAPEAK, TOBRARND, AMIKACINPEAK, AMIKACINTROU, AMIKACIN in the last 72 hours.   Microbiology: No results found for this or any previous visit (from the past 720 hour(s)).  Medical History: Past Medical History:  Diagnosis Date  . Allergy   . Carpal tunnel syndrome   . Hyperlipidemia     Medications:  Infusions:  . ampicillin-sulbactam (UNASYN) IV    . dextrose 5 % and 0.9% NaCl     Assessment: 66 yof cc vertigo with right facial weakness PMH HLD and allergies. HPI acute right sided facial weakness with vertigo and difficulty with speech and swallowing. ER noted CVA medullary on MRI. Pharmacy consulted to dose Unasyn for otitis media instead of oral antibiotic due to failed swallow evaluation.  Goal of Therapy:  Resolve infection Prevent ADE  Plan:  Unasyn 3 gm IV Q6H  Laural Benes, Pharm.D., BCPS Clinical Pharmacist 10/27/2017,9:42 AM

## 2017-10-27 NOTE — Plan of Care (Signed)
  Education: Knowledge of General Education information will improve 10/27/2017 2027 - Progressing by Herbie Baltimore, RN   Education: Knowledge of disease or condition will improve 10/27/2017 2027 - Progressing by Herbie Baltimore, RN Knowledge of secondary prevention will improve 10/27/2017 2027 - Progressing by Herbie Baltimore, RN Knowledge of patient specific risk factors addressed and post discharge goals established will improve 10/27/2017 2027 - Progressing by Herbie Baltimore, RN   Coping: Will identify appropriate support needs 10/27/2017 2027 - Progressing by Herbie Baltimore, RN   Health Behavior/Discharge Planning: Ability to manage health-related needs will improve 10/27/2017 2027 - Progressing by Herbie Baltimore, RN   Health Behavior/Discharge Planning: Ability to manage health-related needs will improve 10/27/2017 2027 - Progressing by Herbie Baltimore, RN   Self-Care: Ability to participate in self-care as condition permits will improve 10/27/2017 2027 - Progressing by Herbie Baltimore, RN Ability to communicate needs accurately will improve 10/27/2017 2027 - Progressing by Herbie Baltimore, RN   Nutrition: Risk of aspiration will decrease 10/27/2017 2027 - Progressing by Herbie Baltimore, RN   Ischemic Stroke/TIA Tissue Perfusion: Complications of ischemic stroke/TIA will be minimized 10/27/2017 2027 - Progressing by Herbie Baltimore, RN

## 2017-10-27 NOTE — Progress Notes (Signed)
Patient's heart rate dropping down to the 30's, non-sustaining. Discussed with Dr. Leslye Peer, advised to refer to cardiology. Patient is asymptomatic, reports no weakness, no SOB, she does report dizziness. She has a history of vertigo. Referral sent to [facility], discussed patient's bradycardia with him, Paraschos states bradycardia is common after a CVA. Patient is currently at 47BPM, BP 138/67, asymptomatic.

## 2017-10-27 NOTE — Progress Notes (Signed)
Discussed with Dr. Leslye Peer of patient's heart rate in the 30's and coming back up to the 50's. Stated he was aware, expected for a post CVA.

## 2017-10-27 NOTE — Progress Notes (Signed)
Patient ID: Bethany Lara, female   DOB: 03-22-1951, 67 y.o.   MRN: [id number]  [facility] PROGRESS NOTE  Bethany Lara KGY:[id number] DOB: 23-Dec-1950 DOA: 10/26/2017 PCP: Kathrine Haddock, NP  HPI/Subjective: Patient presents with trouble speaking, trouble swallowing and blurred vision.  She also has some dizziness.  Tried to take a meclizine without help.  Objective: Vitals:   10/27/17 0852 10/27/17 1252  BP: (!) 143/67 138/67  Pulse: (!) 57 (!) 46  Resp: 16 16  Temp: 97.8 F (36.6 C) 98.5 F (36.9 C)  SpO2: 97% 96%    Filed Weights   10/26/17 1553  Weight: 77.1 kg (170 lb)    ROS: Review of Systems  Constitutional: Negative for chills and fever.  Eyes: Negative for blurred vision.  Respiratory: Negative for cough and shortness of breath.   Cardiovascular: Negative for chest pain.  Gastrointestinal: Negative for abdominal pain, constipation, diarrhea, nausea and vomiting.  Genitourinary: Negative for dysuria.  Musculoskeletal: Negative for joint pain.  Neurological: Negative for dizziness and headaches.   Exam: Physical Exam  Constitutional: She is oriented to person, place, and time.  HENT:  Nose: No mucosal edema.  Mouth/Throat: No oropharyngeal exudate or posterior oropharyngeal edema.  Eyes: Conjunctivae, EOM and lids are normal. Pupils are equal, round, and reactive to light.  Neck: No JVD present. Carotid bruit is not present. No edema present. No thyroid mass and no thyromegaly present.  Cardiovascular: S1 normal and S2 normal. Exam reveals no gallop.  No murmur heard. Pulses:      Dorsalis pedis pulses are 2+ on the right side, and 2+ on the left side.  Respiratory: No respiratory distress. She has no wheezes. She has no rhonchi. She has no rales.  GI: Soft. Bowel sounds are normal. There is no tenderness.  Musculoskeletal:       Right shoulder: She exhibits no swelling.  Lymphadenopathy:    She has no cervical adenopathy.  Neurological: She  is alert and oriented to person, place, and time.  Right facial droop.  As per patient slurred speech.  Difficulty swallowing her own saliva.  Power 5 out of 5 upper and lower extremities  Skin: Skin is warm. No rash noted. Nails show no clubbing.  Psychiatric: She has a normal mood and affect.      Data Reviewed: Basic Metabolic Panel: Recent Labs  Lab 10/26/17 1316 10/27/17 0631  NA 140 140  K 4.1 3.8  CL 107 109  CO2 26 21*  GLUCOSE 120* 118*  BUN 15 14  CREATININE 0.87 0.85  CALCIUM 9.5 9.2   Liver Function Tests: Recent Labs  Lab 10/26/17 1316 10/27/17 0631  AST 26 23  ALT 25 23  ALKPHOS 61 55  BILITOT 0.8 0.6  PROT 7.5 7.0  ALBUMIN 4.6 4.1   CBC: Recent Labs  Lab 10/26/17 1224 10/27/17 0631  WBC 7.1 10.2  NEUTROABS 5.1  --   HGB 15.4 14.5  HCT 45.7 42.4  MCV 87.5 87.5  PLT 283 301   Cardiac Enzymes: Recent Labs  Lab 10/26/17 1224  TROPONINI <0.03    CBG: Recent Labs  Lab 10/26/17 2102 10/27/17 0009 10/27/17 0412 10/27/17 0801 10/27/17 1240  GLUCAP 126* 126* 118* 89 89      Studies: Mr Brain Wo Contrast  Result Date: 10/26/2017 CLINICAL DATA:  Vertigo, right facial droop, and dysarthria. EXAM: MRI HEAD WITHOUT CONTRAST TECHNIQUE: Multiplanar, multiecho pulse sequences of the brain and surrounding structures were obtained without intravenous contrast. COMPARISON:  Head CT 10/26/2017 and MRI 08/31/2017 FINDINGS: Brain: There is a 9 x 4 mm acute infarct in the upper right lateral medulla. A few scattered punctate foci of cerebral white matter T2 hyperintensity are unchanged from the prior MRI and nonspecific. There is very mild cerebral atrophy. No intracranial hemorrhage, mass, midline shift, or extra-axial fluid collection is identified. Vascular: Major intracranial arterial flow voids are preserved. T2 hyperintensity in the left sigmoid sinus and jugular bulb likely reflects slow flow. Skull and upper cervical spine: Small right frontal skull  osteoma and benign appearing small left frontal scalp nodule, unchanged from the prior MRI. No suspicious marrow lesion. Sinuses/Orbits: Unremarkable orbits.  No significant sinus disease. Other: None. IMPRESSION: Small acute right lateral medullary infarct. Electronically Signed   By: Logan Bores M.D.   On: 10/26/2017 15:18   US Carotid Bilateral  Result Date: 10/26/2017 CLINICAL DATA:  67 year old presenting with acute onset of diplopia and dizziness, current history of hyperlipidemia, with an acute small right lateral medullary infarct on MRI earlier today. EXAM: BILATERAL CAROTID DUPLEX ULTRASOUND TECHNIQUE: Pearline Cables scale imaging, color Doppler and duplex ultrasound were performed of bilateral carotid and vertebral arteries in the neck. COMPARISON:  None. FINDINGS: Criteria: Quantification of carotid stenosis is based on velocity parameters that correlate the residual internal carotid diameter with NASCET-based stenosis levels, using the diameter of the distal internal carotid lumen as the denominator for stenosis measurement. The following velocity measurements were obtained: RIGHT ICA:  63/16 cm/sec CCA:  77/82 cm/sec SYSTOLIC ICA/CCA RATIO:  0.9 DIASTOLIC ICA/CCA RATIO:  1.6 ECA:  131/12 cm/sec LEFT ICA:  86/17 cm/sec CCA:  42/3 cm/sec SYSTOLIC ICA/CCA RATIO:  0.9 DIASTOLIC ICA/CCA RATIO:  2.2 ECA:  118/5 cm/sec RIGHT CAROTID ARTERY: Tortuous CCA with mild intimal thickening. No visible calcified or noncalcified plaque in the CCA, ICA or ECA. RIGHT VERTEBRAL ARTERY:  Antegrade flow. LEFT CAROTID ARTERY: Mild intimal thickening involving the CCA. No visible calcified or noncalcified plaque in the CCA, ICA or ECA. LEFT VERTEBRAL ARTERY:  Antegrade flow. IMPRESSION: 1. No evidence of hemodynamically significant stenosis involving either the right or left carotid circulation in the neck. 2. Antegrade flow in both vertebral arteries. Electronically Signed   By: Evangeline Dakin M.D.   On: 10/26/2017 17:30   Ct  Head Code Stroke Wo Contrast  Result Date: 10/26/2017 CLINICAL DATA:  Code stroke. Right facial droop, dizziness, slurred speech, and diplopia. EXAM: CT HEAD WITHOUT CONTRAST TECHNIQUE: Contiguous axial images were obtained from the base of the skull through the vertex without intravenous contrast. COMPARISON:  Brain MRI 08/31/2017 and CT 08/30/2017 FINDINGS: Brain: There is no evidence of acute infarct, intracranial hemorrhage, mass, midline shift, or extra-axial fluid collection. There is very mild cerebral atrophy. Vascular: Calcified atherosclerosis at the skull base, including marked vertebral artery plaque. No hyperdense vessel. Skull: No fracture or suspicious osseous lesion. Small, sessile right frontal skull osteoma. Sinuses/Orbits: Paranasal sinuses and mastoid air cells are clear. Unremarkable orbits. Other: None. ASPECTS [facility] Stroke Program Early CT Score) - Ganglionic level infarction (caudate, lentiform nuclei, internal capsule, insula, M1-M3 cortex): 7 - Supraganglionic infarction (M4-M6 cortex): 3 Total score (0-10 with 10 being normal): 10 IMPRESSION: 1. No evidence of acute intracranial abnormality. 2. ASPECTS is 10. These results were called by telephone at the time of interpretation on 10/26/2017 at 12:27 pm to Dr. Charlotte Crumb , who verbally acknowledged these results. Electronically Signed   By: Logan Bores M.D.   On: 10/26/2017 12:28  Scheduled Meds: . aspirin  300 mg Rectal Daily  . enoxaparin (LOVENOX) injection  40 mg Subcutaneous Q24H  . fluticasone  2 spray Each Nare BID  . pantoprazole (PROTONIX) IV  40 mg Intravenous Q12H   Continuous Infusions: . ampicillin-sulbactam (UNASYN) IV Stopped (10/27/17 1151)  . dextrose 5 % and 0.9% NaCl 75 mL/hr at 10/27/17 1158    Assessment/Plan:  1. Acute Stroke medulla. Patient with slurred speech, Trouble swallowing, blurred vision.  Rectal aspirin.  Speech pathology to see the patient.  Neuro consultation.  Physical therapy  consultation.  Unable to give statin at this time because patient unable to swallow.  Blurred vision is not one eye versus the other which is both eyes together she is unable to see clearly.  When she uses one eye she is able to see.  Echocardiogram pending.  Carotid ultrasound unremarkable. 2. Bradycardia.  Spoke with husband heart rate is always been around 60. 3. Hyperlipidemia unspecified unable to give statin while unable to swallow. 4. Impaired fasting glucose.  Patient is not a diabetic with hemoglobin A1c being 5.5.  Code Status:     Code Status Orders  (From admission, onward)        Start     Ordered   10/26/17 1807  Full code  Continuous     10/26/17 1806    Code Status History    Date Active Date Inactive Code Status Order ID Comments User Context   05/20/2017 16:49 05/21/2017 16:26 Full Code [id number]  Nicholes Mango, MD Inpatient    Advance Directive Documentation     Most Recent Value  Type of Advance Directive  Healthcare Power of Attorney, Living will  Pre-existing out of facility DNR order (yellow form or pink MOST form)  No data  "MOST" Form in Place?  No data     Family Communication: Spoke with husband on the phone Disposition Plan: To be determined  Consultants:  Neurology  Cardiology  Time spent: 28 minutes  [place]

## 2017-10-27 NOTE — Progress Notes (Signed)
Physical Therapy Evaluation Patient Details Name: Bethany Lara MRN: [id number] DOB: 1951/01/26 Today's Date: 10/27/2017   History of Present Illness  Pt. is a 67 year old female who was admitted to [facility] on 10/26/17. Pt. woke up feeling fine, but then developed intense vertigo (medication did not help); pt. developed R sided facial droop and dysarthria. Pt. reports that she had a similar episode in December and her MRI was fine. 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   Clinical Impression  Patient requires assistance for mobility tasks; ambulation was not performed today, as the patient did get dizzy with ambulating to the bedside commode. Patient demonstrates good strength and is I with bed mobility; she did need mild cueing and assistance for safety with sit <-> stands today. Patient may benefit from formal PT to address functional strength and mobility tasks, balance, gait and safety with assistive devices while at [facility].    Follow Up Recommendations      Equipment Recommendations  Rolling walker with 5" wheels    Recommendations for Other Services Speech consult     Precautions / Restrictions Precautions Precautions: Fall Restrictions Weight Bearing Restrictions: No      Mobility  Bed Mobility Overal bed mobility: Needs Assistance       Supine to sit: Supervision Sit to supine: Supervision      Transfers Overall transfer level: Needs assistance Equipment used: Rolling walker (2 wheeled)   Sit to Stand: Min guard         General transfer comment: cues to push from bed and reach back to bed to sit down  Ambulation/Gait                Stairs            Wheelchair Mobility    Modified Rankin (Stroke Patients Only)       Balance Overall balance assessment: Needs assistance Sitting-balance support: Bilateral upper extremity supported;Feet supported Sitting balance-Leahy Scale: Good     Standing balance support: Bilateral upper extremity  supported Standing balance-Leahy Scale: Fair                               Pertinent Vitals/Pain Pain Assessment: (dull ache in R ear; getting IV medication, per patient)    Home Living Family/patient expects to be discharged to:: Private residence Living Arrangements: Spouse/significant other Available Help at Discharge: Family Type of Home: House Home Access: Stairs to enter   [employer] of Steps: (2 step (one onto outside room and one into home)) Home Layout: One level        Prior Function Level of Independence: Independent               Hand Dominance        Extremity/Trunk Assessment   Upper Extremity Assessment Upper Extremity Assessment: Overall WFL for tasks assessed    Lower Extremity Assessment Lower Extremity Assessment: Overall WFL for tasks assessed       Communication   Communication: No difficulties  Cognition Arousal/Alertness: Awake/alert Behavior During Therapy: WFL for tasks assessed/performed Overall Cognitive Status: Within Functional Limits for tasks assessed                                 General Comments: Pt. does have a kleenex over her R eye to help reduce her double vision      General  Comments      Exercises     Assessment/Plan    PT Assessment Patient needs continued PT services  PT Problem List         PT Treatment Interventions DME instruction    PT Goals (Current goals can be found in the Care Plan section)  Acute Rehab PT Goals Patient Stated Goal: (To go home) PT Goal Formulation: With patient Time For Goal Achievement: 11/10/17 Potential to Achieve Goals: Good    Frequency 7X/week   Barriers to discharge        Co-evaluation               AM-PAC PT "6 Clicks" Daily Activity  Outcome Measure Difficulty turning over in bed (including adjusting bedclothes, sheets and blankets)?: A Little Difficulty moving from lying on back to sitting on the side of the  bed? : None Difficulty sitting down on and standing up from a chair with arms (e.g., wheelchair, bedside commode, etc,.)?: A Little Help needed moving to and from a bed to chair (including a wheelchair)?: A Little Help needed walking in hospital room?: A Little Help needed climbing 3-5 steps with a railing? : A Little 6 Click Score: 19    End of Session Equipment Utilized During Treatment: Gait belt Activity Tolerance: Patient tolerated treatment well Patient left: in bed;with call bell/phone within reach;with bed alarm set   PT Visit Diagnosis: Other abnormalities of gait and mobility (R26.89);Difficulty in walking, not elsewhere classified (R26.2);Dizziness and giddiness (R42)    Time: 5631-4970 PT Time Calculation (min) (ACUTE ONLY): 24 min   Charges:         PT G Codes:         Bertram Denver, PT, DPT, CWCE 10/27/2017, 4:13 PM

## 2017-10-27 NOTE — Progress Notes (Signed)
°   10/27/17 2045  Clinical Encounter Type  Visited With Patient and family together  Visit Type Initial  Referral From Nurse  Consult/Referral To Chaplain  Spiritual Encounters  Spiritual Needs Prayer;Emotional   [place] received an Rapid response. CH reported to PT's RM and provided emotional and prayer support for PT and her husband.

## 2017-10-28 ENCOUNTER — Inpatient Hospital Stay
Admit: 2017-10-28 | Discharge: 2017-10-28 | Disposition: A | Discharge status: Home / Self Care | Payer: Medicare Other | Attending: Internal Medicine | Admitting: Internal Medicine

## 2017-10-28 ENCOUNTER — Inpatient Hospital Stay: Payer: Medicare Other

## 2017-10-28 DIAGNOSIS — I639 Cerebral infarction, unspecified: Principal | ICD-10-CM

## 2017-10-28 DIAGNOSIS — R0603 Acute respiratory distress: Secondary | ICD-10-CM

## 2017-10-28 LAB — BASIC METABOLIC PANEL
ANION GAP: 8 (ref 5–15)
BUN: 18 mg/dL (ref 6–20)
CO2: 25 mmol/L (ref 22–32)
Calcium: 9.2 mg/dL (ref 8.9–10.3)
Chloride: 107 mmol/L (ref 101–111)
Creatinine, Ser: 0.85 mg/dL (ref 0.44–1.00)
GFR calc Af Amer: >60 mL/min (ref >60)
GFR calc non Af Amer: >60 mL/min (ref >60)
Glucose, Bld: 216 mg/dL — ABNORMAL HIGH (ref 65–99)
POTASSIUM: 3.6 mmol/L (ref 3.5–5.1)
SODIUM: 140 mmol/L (ref 135–145)

## 2017-10-28 LAB — CBC
HEMATOCRIT: 45.2 % (ref 35.0–47.0)
HEMOGLOBIN: 15.2 g/dL (ref 12.0–16.0)
MCH: 29.2 pg (ref 26.0–34.0)
MCHC: 33.6 g/dL (ref 32.0–36.0)
MCV: 86.9 fL (ref 80.0–100.0)
Platelets: 288 10*3/uL (ref 150–440)
RBC: 5.2 MIL/uL (ref 3.80–5.20)
RDW: 13.5 % (ref 11.5–14.5)
WBC: 12.4 10*3/uL — AB (ref 3.6–11.0)

## 2017-10-28 LAB — GLUCOSE, CAPILLARY
GLUCOSE-CAPILLARY: 184 mg/dL — AB (ref 65–99)
GLUCOSE-CAPILLARY: 189 mg/dL — AB (ref 65–99)
GLUCOSE-CAPILLARY: 126 mg/dL — AB (ref 65–99)
Glucose-Capillary: 84 mg/dL (ref 65–99)
Glucose-Capillary: 113 mg/dL — ABNORMAL HIGH (ref 65–99)
Glucose-Capillary: 105 mg/dL — ABNORMAL HIGH (ref 65–99)
Glucose-Capillary: 91 mg/dL (ref 65–99)

## 2017-10-28 LAB — PHOSPHORUS: PHOSPHORUS: 3.1 mg/dL (ref 2.5–4.6)

## 2017-10-28 LAB — MAGNESIUM: Magnesium: 2.4 mg/dL (ref 1.7–2.4)

## 2017-10-28 MED ORDER — DEXTROSE 5 % IV SOLN
INTRAVENOUS | Status: DC
  Administered 2017-10-28 – 2017-10-30 (×3): via INTRAVENOUS

## 2017-10-28 NOTE — Progress Notes (Signed)
*  PRELIMINARY RESULTS* Echocardiogram 2D Echocardiogram has been performed.  Bethany Lara 10/28/2017, 9:01 AM

## 2017-10-28 NOTE — Care Management (Signed)
Prior to this illness, patient independent in all her adls. Lives with her husband.  Current with pcp.  No issues with transportation or paying for medications.  She has ruled in for acute CVA that is affecting her vision and swallowing.  She is up ambulating with standby by assist.  Failed swallowing study and gi consult present for PEG tube placement.  PT and OT consults are pending.

## 2017-10-28 NOTE — Progress Notes (Signed)
   10/28/17 1105  Clinical Encounter Type  Visited With Patient  Visit Type Follow-up  Spiritual Encounters  Spiritual Needs Emotional;Prayer   Patient engaged in spiritual life review, conversation regarding role of faith in her life and lives of her family members.  Patient spoke of resources available to her and how God is moving in her life.  Chaplain offered prayer.

## 2017-10-28 NOTE — Progress Notes (Signed)
Patient ID: Bethany Lara, female   DOB: February 22, 1951, 67 y.o.   MRN: [id number]  [facility] PROGRESS NOTE  Bethany Lara WLS:[id number] DOB: 02/18/1951 DOA: 10/26/2017 PCP: Kathrine Haddock, NP  HPI/Subjective: Patient presents with trouble speaking, trouble swallowing and blurred vision.  She also has some dizziness.   Today patient's speech is slightly better.  Awaiting for swallow evaluation during my examination  Objective: Vitals:   10/28/17 1200 10/28/17 1308  BP: 119/77 140/83  Pulse: (!) 58 64  Resp: 18 18  Temp:  97.9 F (36.6 C)  SpO2: 95% 96%    Filed Weights   10/26/17 1553 10/27/17 2200  Weight: 77.1 kg (170 lb) 78.9 kg (173 lb 15.1 oz)    ROS: Review of Systems  Constitutional: Negative for chills and fever.  Eyes: Negative for blurred vision.  Respiratory: Negative for cough and shortness of breath.   Cardiovascular: Negative for chest pain.  Gastrointestinal: Negative for abdominal pain, constipation, diarrhea, nausea and vomiting.  Genitourinary: Negative for dysuria.  Musculoskeletal: Negative for joint pain.  Neurological: Negative for dizziness and headaches.   Exam: Physical Exam  Constitutional: She is oriented to person, place, and time.  HENT:  Nose: No mucosal edema.  Mouth/Throat: No oropharyngeal exudate or posterior oropharyngeal edema.  Eyes: Conjunctivae, EOM and lids are normal. Pupils are equal, round, and reactive to light.  Neck: No JVD present. Carotid bruit is not present. No edema present. No thyroid mass and no thyromegaly present.  Cardiovascular: S1 normal and S2 normal. Exam reveals no gallop.  No murmur heard. Pulses:      Dorsalis pedis pulses are 2+ on the right side, and 2+ on the left side.  Respiratory: No respiratory distress. She has no wheezes. She has no rhonchi. She has no rales.  GI: Soft. Bowel sounds are normal. There is no tenderness.  Musculoskeletal:       Right shoulder: She exhibits no swelling.   Lymphadenopathy:    She has no cervical adenopathy.  Neurological: She is alert and oriented to person, place, and time.  Right facial droop.  As per patient slurred speech.  Difficulty swallowing her own saliva.  Power 5 out of 5 upper and lower extremities  Skin: Skin is warm. No rash noted. Nails show no clubbing.  Psychiatric: She has a normal mood and affect.      Data Reviewed: Basic Metabolic Panel: Recent Labs  Lab 10/26/17 1316 10/27/17 0631 10/28/17 0433  NA 140 140 140  K 4.1 3.8 3.6  CL 107 109 107  CO2 26 21* 25  GLUCOSE 120* 118* 216*  BUN 15 14 18   CREATININE 0.87 0.85 0.85  CALCIUM 9.5 9.2 9.2  MG  --   --  2.4  PHOS  --   --  3.1   Liver Function Tests: Recent Labs  Lab 10/26/17 1316 10/27/17 0631  AST 26 23  ALT 25 23  ALKPHOS 61 55  BILITOT 0.8 0.6  PROT 7.5 7.0  ALBUMIN 4.6 4.1   CBC: Recent Labs  Lab 10/26/17 1224 10/27/17 0631 10/28/17 0433  WBC 7.1 10.2 12.4*  NEUTROABS 5.1  --   --   HGB 15.4 14.5 15.2  HCT 45.7 42.4 45.2  MCV 87.5 87.5 86.9  PLT 283 301 288   Cardiac Enzymes: Recent Labs  Lab 10/26/17 1224  TROPONINI <0.03    CBG: Recent Labs  Lab 10/27/17 2329 10/28/17 0446 10/28/17 0833 10/28/17 1203 10/28/17 1334  GLUCAP 150*  184* 189* 126* 113*      Studies: Ct Head Wo Contrast  Result Date: 10/27/2017 CLINICAL DATA:  Patient with slurred speech and difficulty swallowing. EXAM: CT HEAD WITHOUT CONTRAST TECHNIQUE: Contiguous axial images were obtained from the base of the skull through the vertex without intravenous contrast. 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 FINDINGS: Brain: No evidence of acute infarction, hemorrhage, hydrocephalus, extra-axial collection or mass lesion/mass effect. Mild atrophy. No significant white matter hypoattenuation. Vascular: Calcification of the cavernous internal carotid arteries and distal vertebral arteries consistent with cerebrovascular  atherosclerotic disease. No signs of intracranial large vessel occlusion. Skull: Negative for fracture. 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 Sinuses/Orbits: No acute finding. Other: None. Compared with the prior study, the medullary infarct is now faintly visible. There is no hemorrhagic transformation. IMPRESSION: Developing cytotoxic edema within the small RIGHT lateral medullary infarct. No hemorrhagic transformation. Electronically Signed   By: Staci Righter M.D.   On: 10/27/2017 21:53   Mr Brain Wo Contrast  Result Date: 10/26/2017 CLINICAL DATA:  Vertigo, right facial droop, and dysarthria. EXAM: MRI HEAD WITHOUT CONTRAST TECHNIQUE: Multiplanar, multiecho pulse sequences of the brain and surrounding structures were obtained without intravenous contrast. COMPARISON:  Head CT 10/26/2017 and MRI 08/31/2017 FINDINGS: Brain: There is a 9 x 4 mm acute infarct in the upper right lateral medulla. A few scattered punctate foci of cerebral white matter T2 hyperintensity are unchanged from the prior MRI and nonspecific. There is very mild cerebral atrophy. No intracranial hemorrhage, mass, midline shift, or extra-axial fluid collection is identified. Vascular: Major intracranial arterial flow voids are preserved. T2 hyperintensity in the left sigmoid sinus and jugular bulb likely reflects slow flow. Skull and upper cervical spine: Small right frontal skull osteoma and benign appearing small left frontal scalp nodule, unchanged from the prior MRI. No suspicious marrow lesion. Sinuses/Orbits: Unremarkable orbits.  No significant sinus disease. Other: None. IMPRESSION: Small acute right lateral medullary infarct. Electronically Signed   By: Logan Bores M.D.   On: 10/26/2017 15:18   US Carotid Bilateral  Result Date: 10/26/2017 CLINICAL DATA:  67 year old presenting with acute onset of diplopia and dizziness, current history of hyperlipidemia, with an acute small right lateral medullary infarct on MRI earlier  today. EXAM: BILATERAL CAROTID DUPLEX ULTRASOUND TECHNIQUE: Pearline Cables scale imaging, color Doppler and duplex ultrasound were performed of bilateral carotid and vertebral arteries in the neck. COMPARISON:  None. FINDINGS: Criteria: Quantification of carotid stenosis is based on velocity parameters that correlate the residual internal carotid diameter with NASCET-based stenosis levels, using the diameter of the distal internal carotid lumen as the denominator for stenosis measurement. The following velocity measurements were obtained: RIGHT ICA:  63/16 cm/sec CCA:  16/60 cm/sec SYSTOLIC ICA/CCA RATIO:  0.9 DIASTOLIC ICA/CCA RATIO:  1.6 ECA:  131/12 cm/sec LEFT ICA:  86/17 cm/sec CCA:  63/0 cm/sec SYSTOLIC ICA/CCA RATIO:  0.9 DIASTOLIC ICA/CCA RATIO:  2.2 ECA:  118/5 cm/sec RIGHT CAROTID ARTERY: Tortuous CCA with mild intimal thickening. No visible calcified or noncalcified plaque in the CCA, ICA or ECA. RIGHT VERTEBRAL ARTERY:  Antegrade flow. LEFT CAROTID ARTERY: Mild intimal thickening involving the CCA. No visible calcified or noncalcified plaque in the CCA, ICA or ECA. LEFT VERTEBRAL ARTERY:  Antegrade flow. IMPRESSION: 1. No evidence of hemodynamically significant stenosis involving either the right or left carotid circulation in the neck. 2. Antegrade flow in both vertebral arteries. Electronically Signed   By: Evangeline Dakin M.D.   On: 10/26/2017  17:30   Dg Chest Port 1 View  Result Date: 10/27/2017 CLINICAL DATA:  Acute respiratory failure EXAM: PORTABLE CHEST 1 VIEW COMPARISON:  April 23, 2009 FINDINGS: The heart size and mediastinal contours are within normal limits. Both lungs are clear. The visualized skeletal structures are unremarkable. IMPRESSION: No active disease. Electronically Signed   By: Dorise Bullion III M.D   On: 10/27/2017 21:44    Scheduled Meds: . aspirin  300 mg Rectal Daily  . docusate sodium  100 mg Oral BID  . enoxaparin (LOVENOX) injection  40 mg Subcutaneous Q24H  . fluticasone   2 spray Each Nare BID  . pantoprazole (PROTONIX) IV  40 mg Intravenous Q12H   Continuous Infusions: . ampicillin-sulbactam (UNASYN) IV Stopped (10/28/17 1027)  . dextrose 50 mL/hr at 10/28/17 1106    Assessment/Plan:  1. Acute CVA- medulla. Patient with slurred speech, Trouble swallowing, blurred vision.  Rectal aspirin.  Speech pathology to see the patient.  Urology is following.  Echocardiogram is pending.  Physical therapy consultation.  Unable to give statin at this time because patient unable to swallow.  Eyepatch for double vision  Carotid ultrasound unremarkable. 2. Bradycardia.  hr always been around 60 according to the husband.  Cardiology Dr. Saralyn Pilar is following.  Recommending 2D echocardiogram which is pending at this time 3. Hyperlipidemia unspecified unable to give statin while unable to swallow. 4. Impaired fasting glucose.  Patient is not a diabetic with hemoglobin A1c being 5.5. 5.  Hypoglycemia on D10 infusion will change to D5 and wean off as tolerated Code Status:     Code Status Orders  (From admission, onward)        Start     Ordered   10/26/17 1807  Full code  Continuous     10/26/17 1806    Code Status History    Date Active Date Inactive Code Status Order ID Comments User Context   05/20/2017 16:49 05/21/2017 16:26 Full Code [id number]  Nicholes Mango, MD Inpatient    Advance Directive Documentation     Most Recent Value  Type of Advance Directive  Healthcare Power of [place], Living will  Pre-existing out of facility DNR order (yellow form or pink MOST form)  No data  "MOST" Form in Place?  No data     Disposition Plan: To be determined  Consultants:  Neurology  Cardiology  Time spent: 32 minutes  Illene Silver Zyair Russi  [employer]

## 2017-10-28 NOTE — Progress Notes (Signed)
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 Patient received a dose of solumedrol, racepinephrine and lasix. Her CXR is negative. A repeat CT head shows no hemorrhagic transformation. She is back to baseline and offers no complaints except for speech and swallowing difficulties  SUBJECTIVE: No respiratory issues since arriving in the ICU  VITAL SIGNS: BP 130/70   Pulse (!) 56   Temp 97.8 F (36.6 C) (Oral)   Resp 18   Ht 5\' 1"  (1.549 m)   Wt 78.9 kg (173 lb 15.1 oz)   LMP  (LMP Unknown)   SpO2 93%   BMI 32.87 kg/m   HEMODYNAMICS:    VENTILATOR SETTINGS:    INTAKE / OUTPUT: I/O last 3 completed shifts: In: 755 [I.V.:755] Out: -   PHYSICAL EXAMINATION: General: NAD Neuro:  AAO X4, moves all extremities, equal grip strength, speech is slurred HEENT: PERRLA, oral cavity pink with no lesions, trachea midline Cardiovascular: RRR, S1/S2, no MRG, +2 pulses, no edema Lungs: CTAB Abdomen:  Non-distended, +BS Musculoskeletal:  +ROM, no deformities Skin: warm and dry  LABS:  BMET Recent Labs  Lab 10/26/17 1316 10/27/17 0631 10/28/17 0433  NA 140 140 140  K 4.1 3.8 3.6  CL 107 109 107  CO2 26 21* 25  BUN 15 14 18   CREATININE 0.87 0.85 0.85  GLUCOSE 120* 118* 216*    Electrolytes Recent Labs  Lab 10/26/17 1316 10/27/17 0631 10/28/17 0433  CALCIUM 9.5 9.2 9.2  MG  --   --  2.4  PHOS  --   --  3.1    CBC Recent Labs  Lab 10/26/17 1224 10/27/17 0631 10/28/17 0433  WBC 7.1 10.2 12.4*  HGB 15.4 14.5 15.2  HCT 45.7 42.4 45.2  PLT  283 301 288    Coag's Recent Labs  Lab 10/26/17 1224  APTT 30  INR 0.97    Sepsis Markers No results for input(s): LATICACIDVEN, PROCALCITON, O2SATVEN in the last 168 hours.  ABG No results for input(s): PHART, PCO2ART, PO2ART in the last 168 hours.  Liver Enzymes Recent Labs  Lab 10/26/17 1316 10/27/17 0631  AST 26 23  ALT 25 23  ALKPHOS 61 55  BILITOT 0.8 0.6  ALBUMIN 4.6 4.1    Cardiac Enzymes Recent Labs  Lab 10/26/17 1224  TROPONINI <0.03    Glucose Recent Labs  Lab 10/27/17 0801 10/27/17 1240 10/27/17 1710 10/27/17 2208 10/27/17 2329 10/28/17 0446  GLUCAP 89 89 75 101* 150* 184*    Imaging Ct Head Wo Contrast  Result Date: 10/27/2017 CLINICAL DATA:  Patient with slurred speech and difficulty swallowing. EXAM: CT HEAD WITHOUT CONTRAST TECHNIQUE: Contiguous axial images were obtained from the base of the skull through the vertex without intravenous contrast. 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 FINDINGS: Brain: No evidence of acute infarction, hemorrhage, hydrocephalus, extra-axial collection or mass lesion/mass effect. Mild atrophy. No significant white matter hypoattenuation. Vascular: Calcification of the cavernous internal carotid arteries and distal vertebral arteries consistent with cerebrovascular atherosclerotic disease. No signs of intracranial large vessel occlusion.  Skull: Negative for fracture. 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 Sinuses/Orbits: No acute finding. Other: None. Compared with the prior study, the medullary infarct is now faintly visible. There is no hemorrhagic transformation. IMPRESSION: Developing cytotoxic edema within the small RIGHT lateral medullary infarct. No hemorrhagic transformation. Electronically Signed   By: Staci Righter M.D.   On: 10/27/2017 21:53   Dg Chest Port 1 View  Result Date: 10/27/2017 CLINICAL DATA:  Acute respiratory failure EXAM: PORTABLE CHEST 1 VIEW  COMPARISON:  April 23, 2009 FINDINGS: The heart size and mediastinal contours are within normal limits. Both lungs are clear. The visualized skeletal structures are unremarkable. IMPRESSION: No active disease. Electronically Signed   By: Dorise Bullion III M.D   On: 10/27/2017 21:44   STUDIES:  Echo pending  CULTURES: none  ANTIBIOTICS: Unasyn for aspiration  SIGNIFICANT EVENTS: 2/2>admitted 2/3>Transferred too the ICU  LINES/TUBES: PIVs Foley  DISCUSSION: 67 y/o female with acute CVA with residual dysphagia and expressive aphasia being transferred to the ICU for acute respiratory distress. 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    Transfer out of the ICU  .Amarius Toto S. [facility] ANP-BC Pulmonary and Critical Care Medicine [facility] Pager [phone] or [phone]  NB: This document was prepared using Dragon voice recognition software and may include unintentional dictation errors.    10/28/2017, 6:40 AM

## 2017-10-28 NOTE — Consult Note (Addendum)
Referring Physician: Gouru    Chief Complaint: Right facial droop, vertigo  HPI: Bethany Lara is an 67 y.o. female who reports awakening early morning 2/2 at baseline.  Within an hour she had acute onset of vertigo.  Patient then noted difficulty with speech and swallowing and some right facial weakness.  Initially patient felt that this was her vertigo that she had had in the past but with no improvement in symptoms presented for evaluation.  Symptoms continue.  Patient was on an aspirin a day at home prior to presentation. Initial NIHSS of 4.    Date last known well: Date: 10/26/2017 Time last known well: Time: 05:00 tPA Given: No: Outside time window  Past Medical History:  Diagnosis Date  . Allergy   . Carpal tunnel syndrome   . Hyperlipidemia     Past Surgical History:  Procedure Laterality Date  . BUNIONECTOMY    . CARPAL TUNNEL RELEASE  11/14/2015  . CHOLECYSTECTOMY      Family History  Problem Relation Age of Onset  . Diabetes Mother   . Hypertension Mother   . Dementia Mother   . Diabetes Father   . Heart disease Father   . Alzheimer's disease Father   . Glaucoma Father   . Colon cancer Father   . Breast cancer Maternal Aunt    Social History:  reports that  has never smoked. she has never used smokeless tobacco. She reports that she does not drink alcohol or use drugs.  Allergies:  Allergies  Allergen Reactions  . Zocor [Simvastatin] Other (See Comments)    Fatigue, legs achey     Medications:  I have reviewed the patient's current medications. Prior to Admission:  Medications Prior to Admission  Medication Sig Dispense Refill Last Dose  . aspirin 81 MG tablet Take 81 mg by mouth daily.   10/25/2017 at Unknown time  . cetirizine (ZYRTEC) 10 MG tablet Take 1 tablet (10 mg total) by mouth daily. 30 tablet 11 10/25/2017 at Unknown time  . fenofibrate 160 MG tablet Take 1 tablet (160 mg total) by mouth daily. 90 tablet 2 10/25/2017 at Unknown time  .  fluticasone (FLONASE) 50 MCG/ACT nasal spray Place 2 sprays into both nostrils 2 (two) times daily. 16 g 11 10/25/2017 at Unknown time  . meclizine (ANTIVERT) 25 MG tablet Take 1 tablet (25 mg total) by mouth 3 (three) times daily as needed for dizziness. 30 tablet 0 10/26/2017 at Unknown time  . Multiple Vitamin (MULTIVITAMIN) tablet Take 1 tablet by mouth daily.   10/25/2017 at Unknown time  . pravastatin (PRAVACHOL) 40 MG tablet TAKE 1 TABLET (40 MG TOTAL) BY MOUTH AT BEDTIME. 90 tablet 3 10/25/2017 at Unknown time  . VITAMIN D, CHOLECALCIFEROL, PO Take 2,000 Units by mouth daily.   10/25/2017 at Unknown time  . amoxicillin-clavulanate (AUGMENTIN) 875-125 MG tablet Take 1 tablet by mouth 2 (two) times daily. (Patient not taking: Reported on 10/26/2017) 20 tablet 0 Not Taking at Unknown time  . hydrOXYzine (VISTARIL) 50 MG capsule Take 1 capsule (50 mg total) by mouth 3 (three) times daily as needed. (Patient not taking: Reported on 08/09/2017) 30 capsule 2 Not Taking at Unknown time   Scheduled: . aspirin  300 mg Rectal Daily  . docusate sodium  100 mg Oral BID  . enoxaparin (LOVENOX) injection  40 mg Subcutaneous Q24H  . fluticasone  2 spray Each Nare BID  . pantoprazole (PROTONIX) IV  40 mg Intravenous Q12H  ROS: History obtained from the patient  General ROS: negative for - chills, fatigue, fever, night sweats, weight gain or weight loss Psychological ROS: negative for - behavioral disorder, hallucinations, memory difficulties, mood swings or suicidal ideation Ophthalmic ROS: negative for - blurry vision, double vision, eye pain or loss of vision ENT ROS: vertigo Allergy and Immunology ROS: negative for - hives or itchy/watery eyes Hematological and Lymphatic ROS: negative for - bleeding problems, bruising or swollen lymph nodes Endocrine ROS: negative for - galactorrhea, hair pattern changes, polydipsia/polyuria or temperature intolerance Respiratory ROS: negative for - cough, hemoptysis,  shortness of breath or wheezing Cardiovascular ROS: negative for - chest pain, dyspnea on exertion, edema or irregular heartbeat Gastrointestinal ROS: negative for - abdominal pain, diarrhea, hematemesis, nausea/vomiting or stool incontinence Genito-Urinary ROS: negative for - dysuria, hematuria, incontinence or urinary frequency/urgency Musculoskeletal ROS: negative for - joint swelling or muscular weakness Neurological ROS: as noted in HPI Dermatological ROS: negative for rash and skin lesion changes  Physical Examination: Blood pressure 136/67, pulse (!) 52, temperature 97.8 F (36.6 C), temperature source Oral, resp. rate 18, height 5\' 1"  (1.549 m), weight 78.9 kg (173 lb 15.1 oz), SpO2 93 %.  HEENT-  Normocephalic, no lesions, without obvious abnormality.  Normal external eye and conjunctiva.  Normal TM's bilaterally.  Normal auditory canals and external ears. Normal external nose, mucus membranes and septum.  Normal pharynx. Cardiovascular- S1, S2 normal, pulses palpable throughout   Lungs- chest clear, no wheezing, rales, normal symmetric air entry Abdomen- soft, non-tender; bowel sounds normal; no masses,  no organomegaly Extremities- no edema Lymph-no adenopathy palpable Musculoskeletal-no joint tenderness, deformity or swelling Skin-warm and dry, no hyperpigmentation, vitiligo, or suspicious lesions  Neurological Examination   Mental Status: Alert, oriented, thought content appropriate.  Speech fluent without evidence of aphasia but dysarthric.  Able to follow 3 step commands without difficulty. Cranial Nerves: II: Discs flat bilaterally; Visual fields grossly normal, pupils equal, round, reactive to light and accommodation III,IV, VI: ptosis not present, extra-ocular motions intact bilaterally with nystagmus on lateral gaze V,VII: right facial droop, facial light touch sensation decreased on the left VIII: hearing normal bilaterally IX,X: gag reflex reduced XI: bilateral  shoulder shrug XII: Tongue extension to the left Motor: Right : Upper extremity   5/5 with mild pronator drift    Left:     Upper extremity   5/5  Lower extremity   5/5        Lower extremity   5/5 Tone and bulk:normal tone throughout; no atrophy noted Sensory: Pinprick and light touch intact throughout, bilaterally Deep Tendon Reflexes: 2+ and symmetric throughout Plantars: Right: upgoing   Left: upgoing Cerebellar: Normal finger-to-nose and normal heel-to-shin testing bilaterally Gait: not tested due to safety concerns   Laboratory Studies:  Basic Metabolic Panel: Recent Labs  Lab 10/26/17 1316 10/27/17 0631 10/28/17 0433  NA 140 140 140  K 4.1 3.8 3.6  CL 107 109 107  CO2 26 21* 25  GLUCOSE 120* 118* 216*  BUN 15 14 18   CREATININE 0.87 0.85 0.85  CALCIUM 9.5 9.2 9.2  MG  --   --  2.4  PHOS  --   --  3.1    Liver Function Tests: Recent Labs  Lab 10/26/17 1316 10/27/17 0631  AST 26 23  ALT 25 23  ALKPHOS 61 55  BILITOT 0.8 0.6  PROT 7.5 7.0  ALBUMIN 4.6 4.1   No results for input(s): LIPASE, AMYLASE in the last 168 hours. No  results for input(s): AMMONIA in the last 168 hours.  CBC: Recent Labs  Lab 10/26/17 1224 10/27/17 0631 10/28/17 0433  WBC 7.1 10.2 12.4*  NEUTROABS 5.1  --   --   HGB 15.4 14.5 15.2  HCT 45.7 42.4 45.2  MCV 87.5 87.5 86.9  PLT 283 301 288    Cardiac Enzymes: Recent Labs  Lab 10/26/17 1224  TROPONINI <0.03    BNP: Invalid input(s): POCBNP  CBG: Recent Labs  Lab 10/27/17 2037 10/27/17 2208 10/27/17 2329 10/28/17 0446 10/28/17 0833  GLUCAP 84 101* 150* 184* 189*    Microbiology: Results for orders placed or performed during the hospital encounter of 10/26/17  MRSA PCR Screening     Status: None   Collection Time: 10/27/17  9:45 PM  Result Value Ref Range Status   MRSA by PCR NEGATIVE NEGATIVE Final    Comment:        The GeneXpert MRSA Assay (FDA approved for NASAL specimens only), is one component of  a comprehensive MRSA colonization surveillance program. It is not intended to diagnose MRSA infection nor to guide or monitor treatment for MRSA infections. Performed at [facility], [address] [place], [place] [address]     Coagulation Studies: Recent Labs    10/26/17 1224  LABPROT 12.8  INR 0.97    Urinalysis: No results for input(s): COLORURINE, LABSPEC, PHURINE, GLUCOSEU, HGBUR, BILIRUBINUR, KETONESUR, PROTEINUR, UROBILINOGEN, NITRITE, LEUKOCYTESUR in the last 168 hours.  Invalid input(s): APPERANCEUR  Lipid Panel:    Component Value Date/Time   CHOL 190 10/27/2017 0631   CHOL 211 (H) 07/24/2017 0855   CHOL 184 01/21/2017 0855   TRIG 150 (H) 10/27/2017 0631   TRIG 144 01/21/2017 0855   HDL 61 10/27/2017 0631   HDL 53 07/24/2017 0855   CHOLHDL 3.1 10/27/2017 0631   VLDL 30 10/27/2017 0631   VLDL 29 01/21/2017 0855   LDLCALC 99 10/27/2017 0631   LDLCALC 115 (H) 07/24/2017 0855    HgbA1C:  Lab Results  Component Value Date   HGBA1C 5.5 10/27/2017    Urine Drug Screen:      Component Value Date/Time   LABOPIA NONE DETECTED 05/20/2017 1933   COCAINSCRNUR NONE DETECTED 05/20/2017 1933   LABBENZ NONE DETECTED 05/20/2017 1933   AMPHETMU NONE DETECTED 05/20/2017 1933   THCU NONE DETECTED 05/20/2017 1933   LABBARB NONE DETECTED 05/20/2017 1933    Alcohol Level: No results for input(s): ETH in the last 168 hours.  Other results: EKG: normal sinus rhythm at 58 bpm.  Imaging: Ct Head Wo Contrast  Result Date: 10/27/2017 CLINICAL DATA:  Patient with slurred speech and difficulty swallowing. EXAM: CT HEAD WITHOUT CONTRAST TECHNIQUE: Contiguous axial images were obtained from the base of the skull through the vertex without intravenous contrast. 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 FINDINGS: Brain: No evidence of acute infarction, hemorrhage, hydrocephalus, extra-axial collection or mass lesion/mass effect.  Mild atrophy. No significant white matter hypoattenuation. Vascular: Calcification of the cavernous internal carotid arteries and distal vertebral arteries consistent with cerebrovascular atherosclerotic disease. No signs of intracranial large vessel occlusion. Skull: Negative for fracture. 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 Sinuses/Orbits: No acute finding. Other: None. Compared with the prior study, the medullary infarct is now faintly visible. There is no hemorrhagic transformation. IMPRESSION: Developing cytotoxic edema within the small RIGHT lateral medullary infarct. No hemorrhagic transformation. Electronically Signed   By: Staci Righter M.D.   On: 10/27/2017 21:53  Mr Brain Wo Contrast  Result Date: 10/26/2017 CLINICAL DATA:  Vertigo, right facial droop, and dysarthria. EXAM: MRI HEAD WITHOUT CONTRAST TECHNIQUE: Multiplanar, multiecho pulse sequences of the brain and surrounding structures were obtained without intravenous contrast. COMPARISON:  Head CT 10/26/2017 and MRI 08/31/2017 FINDINGS: Brain: There is a 9 x 4 mm acute infarct in the upper right lateral medulla. A few scattered punctate foci of cerebral white matter T2 hyperintensity are unchanged from the prior MRI and nonspecific. There is very mild cerebral atrophy. No intracranial hemorrhage, mass, midline shift, or extra-axial fluid collection is identified. Vascular: Major intracranial arterial flow voids are preserved. T2 hyperintensity in the left sigmoid sinus and jugular bulb likely reflects slow flow. Skull and upper cervical spine: Small right frontal skull osteoma and benign appearing small left frontal scalp nodule, unchanged from the prior MRI. No suspicious marrow lesion. Sinuses/Orbits: Unremarkable orbits.  No significant sinus disease. Other: None. IMPRESSION: Small acute right lateral medullary infarct. Electronically Signed   By: Logan Bores M.D.   On: 10/26/2017 15:18   US Carotid Bilateral  Result Date:  10/26/2017 CLINICAL DATA:  67 year old presenting with acute onset of diplopia and dizziness, current history of hyperlipidemia, with an acute small right lateral medullary infarct on MRI earlier today. EXAM: BILATERAL CAROTID DUPLEX ULTRASOUND TECHNIQUE: Pearline Cables scale imaging, color Doppler and duplex ultrasound were performed of bilateral carotid and vertebral arteries in the neck. COMPARISON:  None. FINDINGS: Criteria: Quantification of carotid stenosis is based on velocity parameters that correlate the residual internal carotid diameter with NASCET-based stenosis levels, using the diameter of the distal internal carotid lumen as the denominator for stenosis measurement. The following velocity measurements were obtained: RIGHT ICA:  63/16 cm/sec CCA:  78/24 cm/sec SYSTOLIC ICA/CCA RATIO:  0.9 DIASTOLIC ICA/CCA RATIO:  1.6 ECA:  131/12 cm/sec LEFT ICA:  86/17 cm/sec CCA:  23/5 cm/sec SYSTOLIC ICA/CCA RATIO:  0.9 DIASTOLIC ICA/CCA RATIO:  2.2 ECA:  118/5 cm/sec RIGHT CAROTID ARTERY: Tortuous CCA with mild intimal thickening. No visible calcified or noncalcified plaque in the CCA, ICA or ECA. RIGHT VERTEBRAL ARTERY:  Antegrade flow. LEFT CAROTID ARTERY: Mild intimal thickening involving the CCA. No visible calcified or noncalcified plaque in the CCA, ICA or ECA. LEFT VERTEBRAL ARTERY:  Antegrade flow. IMPRESSION: 1. No evidence of hemodynamically significant stenosis involving either the right or left carotid circulation in the neck. 2. Antegrade flow in both vertebral arteries. Electronically Signed   By: Evangeline Dakin M.D.   On: 10/26/2017 17:30   Dg Chest Port 1 View  Result Date: 10/27/2017 CLINICAL DATA:  Acute respiratory failure EXAM: PORTABLE CHEST 1 VIEW COMPARISON:  April 23, 2009 FINDINGS: The heart size and mediastinal contours are within normal limits. Both lungs are clear. The visualized skeletal structures are unremarkable. IMPRESSION: No active disease. Electronically Signed   By: Dorise Bullion  III M.D   On: 10/27/2017 21:44   Ct Head Code Stroke Wo Contrast  Result Date: 10/26/2017 CLINICAL DATA:  Code stroke. Right facial droop, dizziness, slurred speech, and diplopia. EXAM: CT HEAD WITHOUT CONTRAST TECHNIQUE: Contiguous axial images were obtained from the base of the skull through the vertex without intravenous contrast. COMPARISON:  Brain MRI 08/31/2017 and CT 08/30/2017 FINDINGS: Brain: There is no evidence of acute infarct, intracranial hemorrhage, mass, midline shift, or extra-axial fluid collection. There is very mild cerebral atrophy. Vascular: Calcified atherosclerosis at the skull base, including marked vertebral artery plaque. No hyperdense vessel. Skull: No fracture or suspicious osseous lesion. Small,  sessile right frontal skull osteoma. Sinuses/Orbits: Paranasal sinuses and mastoid air cells are clear. Unremarkable orbits. Other: None. ASPECTS [facility] Stroke Program Early CT Score) - Ganglionic level infarction (caudate, lentiform nuclei, internal capsule, insula, M1-M3 cortex): 7 - Supraganglionic infarction (M4-M6 cortex): 3 Total score (0-10 with 10 being normal): 10 IMPRESSION: 1. No evidence of acute intracranial abnormality. 2. ASPECTS is 10. These results were called by telephone at the time of interpretation on 10/26/2017 at 12:27 pm to Dr. Charlotte Crumb , who verbally acknowledged these results. Electronically Signed   By: Logan Bores M.D.   On: 10/26/2017 12:28    Assessment: 67 y.o. female presented with vertigo, right facial droop mild right-sided weakness slurred speech and difficulty swallowing.  MRI of the brain reviewed and shows an acute right medullary infarct.  Infarct likely secondary to small vessel disease.  Carotid dopplers show no evidence of hemodynamically significant stenosis.  Echocardiogrampending.  A1c 5.5, LDL 99.  Stroke Risk Factors - hyperlipidemia  Plan: 1. PT consult, OT consult, Speech consult 2. Echocardiogram pending 3. Prophylactic  therapy-patient to remain on aspirin rectally until able to take something p.o. at which point would start Plavix 75 mg along with aspirin 81 mg daily 4. NPO until RN stroke swallow screen 5. Telemetry monitoring 6. Frequent neuro checks 7. Aggressive lipid management with target LDL<70.   Alexis Goodell, MD Neurology [phone] 10/28/2017, 10:46 AM

## 2017-10-28 NOTE — Evaluation (Addendum)
Objective Swallowing Evaluation: Type of Study: Bedside Swallow Evaluation   Patient Details  Name: Bethany Lara MRN: [id number] Date of Birth: Sep 23, 1951  Today's Date: 10/28/2017 Time: SLP Start Time (ACUTE ONLY): 1400 -SLP Stop Time (ACUTE ONLY): 1500  SLP Time Calculation (min) (ACUTE ONLY): 60 min   Past Medical History:  Past Medical History:  Diagnosis Date  . Allergy   . Carpal tunnel syndrome   . Hyperlipidemia    Past Surgical History:  Past Surgical History:  Procedure Laterality Date  . BUNIONECTOMY    . CARPAL TUNNEL RELEASE  11/14/2015  . CHOLECYSTECTOMY     HPI: Pt is a 67 y.o. female has a past medical history significant for HLD and allergies now with acute onset right facial weakness with vertigo and difficulty with speech and swallowing. 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 Pt presents w/ R labial-facial weakness w/ min Dysarthria(speech overall intelligible). Pt is alert w/ no apparent Aphasia or Cognitive deficits. Pt does present w/ need to cough and spit her saliva or suction it using a yaunker versus swallowing it. Pt indicated she felt she "choked" on her saliva last evening while watching tv - this has been ongoing for ~48+ hours per her report.    Subjective: pt awake, verbally conversive w/ R labial/facial weakness noted resulting in min dysarthria. R labial droop. Pt is also c/o diplopia(R vision deficits).    Assessment / Plan / Recommendation:   Objective:  Radiological Procedure: A videoflouroscopic evaluation of oral-preparatory, reflex initiation, and pharyngeal phases of the swallow was performed; as well as a screening of the upper esophageal phase.  I. POSTURE: upright  II. VIEW: lateral III. COMPENSATORY STRATEGIES: repeat, dry effortful swallows; cough and expectorate IV. BOLUSES ADMINISTERED:  Thin Liquid: NT  Nectar-thick Liquid: 1 tsp trial  Honey-thick Liquid: NT  Puree: NT  Mechanical Soft: NT   CHL IP  CLINICAL IMPRESSIONS 10/28/2017  Clinical Impression Pt appears to present w/ severe-profound pharyngeal phase dysphagia w/ high risk for aspiration and negative sequelae of such impacting her Pulmonary status. During the pharyngeal phase of swallowing, pt exhibited severe-profound pharyngeal phase dysphagia c/b an incomplete swallow and severely delayed pharyngeal swallow initiation. This study consisted of only 1 tsp trial of Nectar consistency liquids d/t concern for dysphagia. The bolus material was transferred adequately through the oral phase w/ adequate bolus control and cohesion noted. However, the bolus trial/material dumped directly into the pharynx to the level of the pyriform sinuses filling them as she appeared to attempt to initate a swallow. As verbally instructed pt, pt attempted to initate a pharyngeal swallow, however, laryngeal excursion and anterior movement of the arytenoids was incomplete w/ little to no movement. No epiglottic inversion noted. Base of tongue continued to pump and move posteriorly in attempt at swallowing but bolus material only moved superiorly onto base of tongue. When the bolus material could not move into the Esophagus (no UES opening - cricopharyngeus muscle did not appear to relax/open to let any material through), the bolus material fell over into the airway resulting in Aspiration. Pt immediately attempted to cough w/ initial attempt poor; stridor noise noted during her breathing. Upon calming pt to focus, she was able to clear her throat then cough which expectorated some of the material. This was performed again and yaunker used to suction mouth. No further trials were given d/t the severity of her swallow function. Pt returned to her room. MD/NSG notified.   SLP Visit  Diagnosis Dysphagia, pharyngeal phase (R13.13)  Attention and concentration deficit following --  Frontal lobe and executive function deficit following --  Impact on safety and function Severe  aspiration risk;Risk for inadequate nutrition/hydration      CHL IP TREATMENT RECOMMENDATION 10/28/2017  Treatment Recommendations Therapy as outlined in treatment plan below - dysphagia therapy once nutritional support has been established     Prognosis 10/28/2017  Prognosis for Safe Diet Advancement Guarded  Barriers to Reach Goals Severity of deficits  Barriers/Prognosis Comment --    CHL IP DIET RECOMMENDATION 10/28/2017  SLP Diet Recommendations NPO;Alternative means - long-term  Liquid Administration via (No Data)  Medication Administration Via alternative means  Compensations --  Postural Changes --      CHL IP OTHER RECOMMENDATIONS 10/28/2017  Recommended Consults Consider GI evaluation  Oral Care Recommendations Oral care QID;Patient independent with oral care  Other Recommendations (No Data)      CHL IP FOLLOW UP RECOMMENDATIONS 10/28/2017  Follow up Recommendations Home health SLP      CHL IP FREQUENCY AND DURATION 10/28/2017  Speech Therapy Frequency (ACUTE ONLY) min 3x week  Treatment Duration 2 weeks           CHL IP ORAL PHASE 10/28/2017  Oral Phase WFL  Oral - Pudding Teaspoon --  Oral - Pudding Cup --  Oral - Honey Teaspoon --  Oral - Honey Cup --  Oral - Nectar Teaspoon --  Oral - Nectar Cup --  Oral - Nectar Straw --  Oral - Thin Teaspoon --  Oral - Thin Cup --  Oral - Thin Straw --  Oral - Puree --  Oral - Mech Soft --  Oral - Regular --  Oral - Multi-Consistency --  Oral - Pill --  Oral Phase - Comment --    CHL IP PHARYNGEAL PHASE 10/28/2017  Pharyngeal Phase Impaired  Pharyngeal- Pudding Teaspoon --  Pharyngeal --  Pharyngeal- Pudding Cup --  Pharyngeal --  Pharyngeal- Honey Teaspoon --  Pharyngeal --  Pharyngeal- Honey Cup --  Pharyngeal --  Pharyngeal- Nectar Teaspoon --  Pharyngeal --  Pharyngeal- Nectar Cup --  Pharyngeal --  Pharyngeal- Nectar Straw --  Pharyngeal --  Pharyngeal- Thin Teaspoon --  Pharyngeal --  Pharyngeal- Thin  Cup --  Pharyngeal --  Pharyngeal- Thin Straw --  Pharyngeal --  Pharyngeal- Puree --  Pharyngeal --  Pharyngeal- Mechanical Soft --  Pharyngeal --  Pharyngeal- Regular --  Pharyngeal --  Pharyngeal- Multi-consistency --  Pharyngeal --  Pharyngeal- Pill --  Pharyngeal --  Pharyngeal Comment Pt exhibited severe-profound pharyngeal phase dysphagia c/b an incomplete swallow and severely delayed pharyngeal swallow initiation. This study consisted of only 1 tsp trial of Nectar consistency liquids d/t concern for dysphagia. The bolus material was transferred adequately through the oral phase w/ adequate bolus control and cohesion noted. However, the bolus trial/material dumped directly into the pharynx to the level of the pyriform sinuses filling them as she appeared to attempt to initate a swallow. As verbally instructed pt, pt attempted to initate a pharyngeal swallow, however, laryngeal excursion and anterior movement of the arytenoids was incomplete w/ little to no movement. No epiglottic inversion noted. Base of tongue continued to pump and move posteriorly in attempt at swallowing but bolus material only moved superiorly onto base of tongue. When the bolus material could not move into the Esophagus (no UES opening - cricopharyngeus muscle did not appear to relax/open to let any material through), the bolus material  fell over into the airway resulting in Aspiration. Pt immediately attempted to cough w/ initial attempt poor; stridor noise noted during her breathing. Upon calming pt to focus, she was able to clear her throat then cough which expectorated some of the material. This was performed again and yaunker used to suction mouth. No further trials were given d/t the severity of her swallow function. Pt returned to her room. MD/NSG notified.      CHL IP CERVICAL ESOPHAGEAL PHASE 10/28/2017  Cervical Esophageal Phase (No Data)  Pudding Teaspoon --  Pudding Cup --  Honey Teaspoon --  Honey Cup --   Nectar Teaspoon --  Nectar Cup --  Nectar Straw --  Thin Teaspoon --  Thin Cup --  Thin Straw --  Puree --  Mechanical Soft --  Regular --  Multi-consistency --  Pill --  Cervical Esophageal Comment --    No flowsheet data found.     Orinda Kenner, MS, CCC-SLP Watson,Katherine 10/28/2017, 4:43 PM

## 2017-10-28 NOTE — Progress Notes (Signed)
PT Cancellation Note  Patient Details Name: Bethany Lara MRN: [id number] DOB: 10-09-1950   Cancelled Treatment:    Reason Eval/Treat Not Completed: Patient at procedure or test/unavailable.  Upon therapists arrival to ICU, pt not in room.  Nursing reports pt currently off floor for swallow study.  Will re-attempt PT session at a later date/time.  Leitha Bleak, PT 10/28/17, 2:10 PM [phone]

## 2017-10-28 NOTE — Progress Notes (Signed)
[facility] Cardiology  SUBJECTIVE: Patient laying in bed, denies chest pain, shortness of breath, palpitations   Vitals:   10/28/17 0400 10/28/17 0500 10/28/17 0524 10/28/17 0600  BP: 127/75 130/70  127/64  Pulse: 63 (!) 56  (!) 50  Resp: 18 18  16   Temp:   97.8 F (36.6 C)   TempSrc:   Oral   SpO2: 97% 92% 93% 94%  Weight:      Height:         Intake/Output Summary (Last 24 hours) at 10/28/2017 0830 Last data filed at 10/28/2017 0600 Gross per 24 hour  Intake 362.5 ml  Output 2350 ml  Net -1987.5 ml      PHYSICAL EXAM  General: Well developed, well nourished, in no acute distress HEENT:  Normocephalic and atramatic Neck:  No JVD.  Lungs: Clear bilaterally to auscultation and percussion. Heart: HRRR . Normal S1 and S2 without gallops or murmurs.  Abdomen: Bowel sounds are positive, abdomen soft and non-tender  Msk:  Back normal, normal gait. Normal strength and tone for age. Extremities: No clubbing, cyanosis or edema.   Neuro: Alert and oriented X 3. Psych:  Good affect, responds appropriately   LABS: Basic Metabolic Panel: Recent Labs    10/27/17 0631 10/28/17 0433  NA 140 140  K 3.8 3.6  CL 109 107  CO2 21* 25  GLUCOSE 118* 216*  BUN 14 18  CREATININE 0.85 0.85  CALCIUM 9.2 9.2  MG  --  2.4  PHOS  --  3.1   Liver Function Tests: Recent Labs    10/26/17 1316 10/27/17 0631  AST 26 23  ALT 25 23  ALKPHOS 61 55  BILITOT 0.8 0.6  PROT 7.5 7.0  ALBUMIN 4.6 4.1   No results for input(s): LIPASE, AMYLASE in the last 72 hours. CBC: Recent Labs    10/26/17 1224 10/27/17 0631 10/28/17 0433  WBC 7.1 10.2 12.4*  NEUTROABS 5.1  --   --   HGB 15.4 14.5 15.2  HCT 45.7 42.4 45.2  MCV 87.5 87.5 86.9  PLT 283 301 288   Cardiac Enzymes: Recent Labs    10/26/17 1224  TROPONINI <0.03   BNP: Invalid input(s): POCBNP D-Dimer: No results for input(s): DDIMER in the last 72 hours. Hemoglobin A1C: Recent Labs    10/27/17 0631  HGBA1C 5.5   Fasting  Lipid Panel: Recent Labs    10/27/17 0631  CHOL 190  HDL 61  LDLCALC 99  TRIG 150*  CHOLHDL 3.1   Thyroid Function Tests: No results for input(s): TSH, T4TOTAL, T3FREE, THYROIDAB in the last 72 hours.  Invalid input(s): FREET3 Anemia Panel: No results for input(s): VITAMINB12, FOLATE, FERRITIN, TIBC, IRON, RETICCTPCT in the last 72 hours.  Ct Head Wo Contrast  Result Date: 10/27/2017 CLINICAL DATA:  Patient with slurred speech and difficulty swallowing. EXAM: CT HEAD WITHOUT CONTRAST TECHNIQUE: Contiguous axial images were obtained from the base of the skull through the vertex without intravenous contrast. 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 FINDINGS: Brain: No evidence of acute infarction, hemorrhage, hydrocephalus, extra-axial collection or mass lesion/mass effect. Mild atrophy. No significant white matter hypoattenuation. Vascular: Calcification of the cavernous internal carotid arteries and distal vertebral arteries consistent with cerebrovascular atherosclerotic disease. No signs of intracranial large vessel occlusion. Skull: Negative for fracture. 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 Sinuses/Orbits: No acute finding. Other: None. Compared with the prior study, the medullary infarct is now faintly visible. There is no hemorrhagic  transformation. IMPRESSION: Developing cytotoxic edema within the small RIGHT lateral medullary infarct. No hemorrhagic transformation. Electronically Signed   By: Staci Righter M.D.   On: 10/27/2017 21:53   Mr Brain Wo Contrast  Result Date: 10/26/2017 CLINICAL DATA:  Vertigo, right facial droop, and dysarthria. EXAM: MRI HEAD WITHOUT CONTRAST TECHNIQUE: Multiplanar, multiecho pulse sequences of the brain and surrounding structures were obtained without intravenous contrast. COMPARISON:  Head CT 10/26/2017 and MRI 08/31/2017 FINDINGS: Brain: There is a 9 x 4 mm acute infarct in the upper right lateral medulla. A few  scattered punctate foci of cerebral white matter T2 hyperintensity are unchanged from the prior MRI and nonspecific. There is very mild cerebral atrophy. No intracranial hemorrhage, mass, midline shift, or extra-axial fluid collection is identified. Vascular: Major intracranial arterial flow voids are preserved. T2 hyperintensity in the left sigmoid sinus and jugular bulb likely reflects slow flow. Skull and upper cervical spine: Small right frontal skull osteoma and benign appearing small left frontal scalp nodule, unchanged from the prior MRI. No suspicious marrow lesion. Sinuses/Orbits: Unremarkable orbits.  No significant sinus disease. Other: None. IMPRESSION: Small acute right lateral medullary infarct. Electronically Signed   By: Logan Bores M.D.   On: 10/26/2017 15:18   US Carotid Bilateral  Result Date: 10/26/2017 CLINICAL DATA:  67 year old presenting with acute onset of diplopia and dizziness, current history of hyperlipidemia, with an acute small right lateral medullary infarct on MRI earlier today. EXAM: BILATERAL CAROTID DUPLEX ULTRASOUND TECHNIQUE: Pearline Cables scale imaging, color Doppler and duplex ultrasound were performed of bilateral carotid and vertebral arteries in the neck. COMPARISON:  None. FINDINGS: Criteria: Quantification of carotid stenosis is based on velocity parameters that correlate the residual internal carotid diameter with NASCET-based stenosis levels, using the diameter of the distal internal carotid lumen as the denominator for stenosis measurement. The following velocity measurements were obtained: RIGHT ICA:  63/16 cm/sec CCA:  33/29 cm/sec SYSTOLIC ICA/CCA RATIO:  0.9 DIASTOLIC ICA/CCA RATIO:  1.6 ECA:  131/12 cm/sec LEFT ICA:  86/17 cm/sec CCA:  51/8 cm/sec SYSTOLIC ICA/CCA RATIO:  0.9 DIASTOLIC ICA/CCA RATIO:  2.2 ECA:  118/5 cm/sec RIGHT CAROTID ARTERY: Tortuous CCA with mild intimal thickening. No visible calcified or noncalcified plaque in the CCA, ICA or ECA. RIGHT  VERTEBRAL ARTERY:  Antegrade flow. LEFT CAROTID ARTERY: Mild intimal thickening involving the CCA. No visible calcified or noncalcified plaque in the CCA, ICA or ECA. LEFT VERTEBRAL ARTERY:  Antegrade flow. IMPRESSION: 1. No evidence of hemodynamically significant stenosis involving either the right or left carotid circulation in the neck. 2. Antegrade flow in both vertebral arteries. Electronically Signed   By: Evangeline Dakin M.D.   On: 10/26/2017 17:30   Dg Chest Port 1 View  Result Date: 10/27/2017 CLINICAL DATA:  Acute respiratory failure EXAM: PORTABLE CHEST 1 VIEW COMPARISON:  April 23, 2009 FINDINGS: The heart size and mediastinal contours are within normal limits. Both lungs are clear. The visualized skeletal structures are unremarkable. IMPRESSION: No active disease. Electronically Signed   By: Dorise Bullion III M.D   On: 10/27/2017 21:44   Ct Head Code Stroke Wo Contrast  Result Date: 10/26/2017 CLINICAL DATA:  Code stroke. Right facial droop, dizziness, slurred speech, and diplopia. EXAM: CT HEAD WITHOUT CONTRAST TECHNIQUE: Contiguous axial images were obtained from the base of the skull through the vertex without intravenous contrast. COMPARISON:  Brain MRI 08/31/2017 and CT 08/30/2017 FINDINGS: Brain: There is no evidence of acute infarct, intracranial hemorrhage, mass, midline shift, or extra-axial  fluid collection. There is very mild cerebral atrophy. Vascular: Calcified atherosclerosis at the skull base, including marked vertebral artery plaque. No hyperdense vessel. Skull: No fracture or suspicious osseous lesion. Small, sessile right frontal skull osteoma. Sinuses/Orbits: Paranasal sinuses and mastoid air cells are clear. Unremarkable orbits. Other: None. ASPECTS [facility] Stroke Program Early CT Score) - Ganglionic level infarction (caudate, lentiform nuclei, internal capsule, insula, M1-M3 cortex): 7 - Supraganglionic infarction (M4-M6 cortex): 3 Total score (0-10 with 10 being normal):  10 IMPRESSION: 1. No evidence of acute intracranial abnormality. 2. ASPECTS is 10. These results were called by telephone at the time of interpretation on 10/26/2017 at 12:27 pm to Dr. Charlotte Crumb , who verbally acknowledged these results. Electronically Signed   By: Logan Bores M.D.   On: 10/26/2017 12:28     Echo pending  TELEMETRY: Sinus bradycardia 55 bpm:  ASSESSMENT AND PLAN:  Principal Problem:   CVA (cerebrovascular accident) ([place]) Active Problems:   Hyperlipidemia   Bradycardia   Swallowing dysfunction   CVA (cerebral vascular accident) ([place])   Catatonia   Acute respiratory distress    1.  Sinus bradycardia, of uncertain clinical significance, in the setting of acute stroke, with history of slow heart rate 2.  Acute medullary CVA, with blurred vision, slurred speech, difficulty swallowing  Recommendations  1.  Agree with current therapy 2.  Review 2D echocardiogram 3.  Continue to monitor for now  Sign off for now, please call if any questions   Isaias Cowman, MD, PhD, [facility] 10/28/2017 8:30 AM

## 2017-10-28 NOTE — Progress Notes (Signed)
Patient to move to room 116. Report called to Maddie RN on 1C. Barnetta Chapel from [place] and RN talked with patient about results of swallow evaluation. ST and RN gave patient emotional support. Dr. 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 Medication sent through tube system. All belongings sent with patient. Lens fell out of right side of eyeglasses, placed in small side pocket of patient's personal bag per pt request. This RN called husband with update and new room information.

## 2017-10-28 NOTE — Progress Notes (Signed)
Family Meeting Note  Advance Directive:yes  Today a meeting took place with the Patient.    The following clinical team members were present during this meeting:MD  The following were discussed:Patient's diagnosis: Acute medullary stroke with dysphagia, diplopia, impaired blood sugar and hypoglycemia, Patient's progosis: Unable to determine and Goals for treatment: Full Code; husband is the healthcare power of attorney  Additional follow-up to be provided: Hospitalist, neurology  Time spent during discussion:18 min  Nicholes Mango, MD

## 2017-10-29 DIAGNOSIS — R1310 Dysphagia, unspecified: Secondary | ICD-10-CM

## 2017-10-29 LAB — GLUCOSE, CAPILLARY
GLUCOSE-CAPILLARY: 101 mg/dL — AB (ref 65–99)
GLUCOSE-CAPILLARY: 102 mg/dL — AB (ref 65–99)
GLUCOSE-CAPILLARY: 85 mg/dL (ref 65–99)
GLUCOSE-CAPILLARY: 108 mg/dL — AB (ref 65–99)
Glucose-Capillary: 89 mg/dL (ref 65–99)
Glucose-Capillary: 90 mg/dL (ref 65–99)

## 2017-10-29 MED ORDER — JEVITY 1.2 CAL PO LIQD: 1000.0000 mL | ORAL | Status: DC

## 2017-10-29 NOTE — Progress Notes (Signed)
Dr Earleen Newport notified of pt wanting to wait until see got her  Peg tube  Before starting  Feedings refused  dobbhoff

## 2017-10-29 NOTE — Care Management Important Message (Signed)
Important Message  Patient Details  Name: Bethany Lara MRN: [id number] Date of Birth: 31-Jul-1951   Medicare Important Message Given:  Yes    Shelbie Ammons, RN 10/29/2017, 7:21 AM

## 2017-10-29 NOTE — Consult Note (Signed)
Cephas Darby, MD [address]  [place]  [place], [place] [address]  Main: [phone]  Fax: [phone] Pager: [phone]   Consultation  Referring Provider:     No ref. provider found Primary Care Physician:  Kathrine Haddock, NP Primary Gastroenterologist: None         Reason for Consultation:     PEG  Date of Admission:  10/26/2017 Date of Consultation:  10/29/2017         HPI:   LYNNDA Lara is a 67 y.o. female past medical history significant for HLD and presented on 10/26/2017 with acute onset right facial weakness with vertigo and difficulty with speech and swallowing. 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 She failed swallow evaluation and GI is consulted for PEG placement. Patient denies having any GI symptoms. She never had an upper endoscopy. She has been nothing by mouth and not started on any tube feeds. She is on Unasyn for otitis media. She is getting aspirin 300 mg per rectum. She received 1 dose today  NSAIDs: None  Antiplts/Anticoagulants/Anti thrombotics: Aspirin 81  GI Procedures: Colonoscopy 2014, report not available. Per patient, it was normal  Past Medical History:  Diagnosis Date  . Allergy   . Carpal tunnel syndrome   . Hyperlipidemia     Past Surgical History:  Procedure Laterality Date  . BUNIONECTOMY    . CARPAL TUNNEL RELEASE  11/14/2015  . CHOLECYSTECTOMY      Prior to Admission medications   Medication Sig Start Date End Date Taking? Authorizing Provider  aspirin 81 MG tablet Take 81 mg by mouth daily.   Yes [provider]  cetirizine (ZYRTEC) 10 MG tablet Take 1 tablet (10 mg total) by mouth daily. 05/24/17  Yes Volney American, PA-C  fenofibrate 160 MG tablet Take 1 tablet (160 mg total) by mouth daily. 09/10/17  Yes Kathrine Haddock, NP  fluticasone (FLONASE) 50 MCG/ACT nasal spray Place 2 sprays into both nostrils 2 (two) times daily. 05/24/17  Yes Volney American, PA-C  meclizine  (ANTIVERT) 25 MG tablet Take 1 tablet (25 mg total) by mouth 3 (three) times daily as needed for dizziness. 05/24/17  Yes Volney American, PA-C  Multiple Vitamin (MULTIVITAMIN) tablet Take 1 tablet by mouth daily.   Yes [provider]  pravastatin (PRAVACHOL) 40 MG tablet TAKE 1 TABLET (40 MG TOTAL) BY MOUTH AT BEDTIME. 08/16/17  Yes Kathrine Haddock, NP  VITAMIN D, CHOLECALCIFEROL, PO Take 2,000 Units by mouth daily.   Yes [provider]  amoxicillin-clavulanate (AUGMENTIN) 875-125 MG tablet Take 1 tablet by mouth 2 (two) times daily. Patient not taking: Reported on 10/26/2017 09/06/17   Kathrine Haddock, NP  hydrOXYzine (VISTARIL) 50 MG capsule Take 1 capsule (50 mg total) by mouth 3 (three) times daily as needed. Patient not taking: Reported on 08/09/2017 06/20/16   Volney American, PA-C    Family History  Problem Relation Age of Onset  . Diabetes Mother   . Hypertension Mother   . Dementia Mother   . Diabetes Father   . Heart disease Father   . Alzheimer's disease Father   . Glaucoma Father   . Colon cancer Father   . Breast cancer Maternal Aunt      Social History   Tobacco Use  . Smoking status: Never Smoker  . Smokeless tobacco: Never Used  Substance Use Topics  . Alcohol use: No    Alcohol/week: 0.0  oz  . Drug use: No    Allergies as of 10/26/2017 - Review Complete 10/26/2017  Allergen Reaction Noted  . Zocor [simvastatin] Other (See Comments) 07/15/2015    Review of Systems:    All systems reviewed and negative except where noted in HPI.   Physical Exam:  Vital signs in last 24 hours: Temp:  [97.6 F (36.4 C)-98.6 F (37 C)] 98.6 F (37 C) (02/05 1415) Pulse Rate:  [50-55] 53 (02/05 1415) Resp:  [18-20] 20 (02/05 0819) BP: (126-145)/(55-66) 126/55 (02/05 1415) SpO2:  [95 %] 95 % (02/05 1415) Weight:  [177 lb 12.8 oz (80.6 kg)] 177 lb 12.8 oz (80.6 kg) (02/05 0556) Last BM Date: 10/25/17 General:   Pleasant, cooperative in  NAD Head:  Normocephalic and atraumatic. Eyes:   No icterus.   Conjunctiva pink. PERRLA. Ears:  Normal auditory acuity. Neck:  Supple; no masses or thyroidomegaly Lungs: Respirations even and unlabored. Lungs clear to auscultation bilaterally.   No wheezes, crackles, or rhonchi.  Heart:  Regular rate and rhythm;  Without murmur, clicks, rubs or gallops Abdomen:  Soft, nondistended, nontender. Normal bowel sounds. No appreciable masses or hepatomegaly.  No rebound or guarding.  Rectal:  Not performed. Msk:  Symmetrical without gross deformities.  Strength normal  Extremities:  Without edema, cyanosis or clubbing. Neurologic:  Alert and oriented x3;  slurred speech, deviation of mouth to right Skin:  Intact without significant lesions or rashes. Psych:  Alert and cooperative. Normal affect.  LAB RESULTS: CBC Latest Ref Rng & Units 10/28/2017 10/27/2017 10/26/2017  WBC 3.6 - 11.0 K/uL 12.4(H) 10.2 7.1  Hemoglobin 12.0 - 16.0 g/dL 15.2 14.5 15.4  Hematocrit 35.0 - 47.0 % 45.2 42.4 45.7  Platelets 150 - 440 K/uL 288 301 283    BMET BMP Latest Ref Rng & Units 10/28/2017 10/27/2017 10/26/2017  Glucose 65 - 99 mg/dL 216(H) 118(H) 120(H)  BUN 6 - 20 mg/dL 18 14 15   Creatinine 0.44 - 1.00 mg/dL 0.85 0.85 0.87  BUN/Creat Ratio 12 - 28 - - -  Sodium 135 - 145 mmol/L 140 140 140  Potassium 3.5 - 5.1 mmol/L 3.6 3.8 4.1  Chloride 101 - 111 mmol/L 107 109 107  CO2 22 - 32 mmol/L 25 21(L) 26  Calcium 8.9 - 10.3 mg/dL 9.2 9.2 9.5    LFT Hepatic Function Latest Ref Rng & Units 10/27/2017 10/26/2017 08/30/2017  Total Protein 6.5 - 8.1 g/dL 7.0 7.5 7.2  Albumin 3.5 - 5.0 g/dL 4.1 4.6 4.3  AST 15 - 41 U/L 23 26 23   ALT 14 - 54 U/L 23 25 20   Alk Phosphatase 38 - 126 U/L 55 61 69  Total Bilirubin 0.3 - 1.2 mg/dL 0.6 0.8 0.6     STUDIES: Ct Head Wo Contrast  Result Date: 10/27/2017 CLINICAL DATA:  Patient with slurred speech and difficulty swallowing. EXAM: CT HEAD WITHOUT CONTRAST TECHNIQUE: Contiguous axial  images were obtained from the base of the skull through the vertex without intravenous contrast. 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 FINDINGS: Brain: No evidence of acute infarction, hemorrhage, hydrocephalus, extra-axial collection or mass lesion/mass effect. Mild atrophy. No significant white matter hypoattenuation. Vascular: Calcification of the cavernous internal carotid arteries and distal vertebral arteries consistent with cerebrovascular atherosclerotic disease. No signs of intracranial large vessel occlusion. Skull: Negative for fracture. 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 Sinuses/Orbits: No acute finding. Other: None. Compared with the prior study, the medullary infarct is now faintly visible.  There is no hemorrhagic transformation. IMPRESSION: Developing cytotoxic edema within the small RIGHT lateral medullary infarct. No hemorrhagic transformation. Electronically Signed   By: Staci Righter M.D.   On: 10/27/2017 21:53   Dg Chest Port 1 View  Result Date: 10/27/2017 CLINICAL DATA:  Acute respiratory failure EXAM: PORTABLE CHEST 1 VIEW COMPARISON:  April 23, 2009 FINDINGS: The heart size and mediastinal contours are within normal limits. Both lungs are clear. The visualized skeletal structures are unremarkable. IMPRESSION: No active disease. Electronically Signed   By: Dorise Bullion III M.D   On: 10/27/2017 21:44      Impression / Plan:   Bethany Lara is a 67 y.o. female with acute ischemic medullary stroke, failed swallowing evaluation and GI consulted for PEG placement.  - Plan for PEG tomorrow pending results of echocardiogram - Hold aspirin tomorrow for PEG placement - Maintain nothing by mouth  I have discussed alternative options, risks & benefits,  which include, but are not limited to, bleeding, infection, perforation,respiratory complication & drug reaction.  The patient agrees with this plan & written consent will be obtained.      Thank you for involving me in the care of this patient.      LOS: 3 days   Sherri Sear, MD  10/29/2017, 6:41 PM   Note: This dictation was prepared with Dragon dictation along with smaller phrase technology. Any transcriptional errors that result from this process are unintentional.

## 2017-10-29 NOTE — Evaluation (Signed)
Occupational Therapy Evaluation Patient Details Name: Bethany Lara MRN: [id number] DOB: 02-Jun-1951 Today's Date: 10/29/2017    History of Present Illness Pt is a 67 year old female who was admitted to [facility] on 10/26/17.  Pt woke up feeling fine, but then developed intense vertigo (medication did not help); pt developed R sided facial droop and dysarthria.  Also presenting with dysphagia, double vision, and dizziness.  Pt reports that she had a similar episode in December and her MRI was fine. 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  Pt transferred to ICU PM of 10/27/17 with sudden onset respiratory distress; pt also transferred out of ICU 10/28/17.  Pt failed swallow test 10/28/17.   Clinical Impression   Patient seen for OT evaluation this date.  Patient lives at home with her spouse in a one level home with 1 step to enter. Patient demonstrates muscle weakness overall, no specific focal areas of weakness in extremities, decreased strength and endurance to perform self care tasks and requires min guard for transfers and toileting.  She does demonstrate slurred speech, right eyelid droop and right facial droop and has difficulty with swallowing.  Patient would benefit from skilled OT to maximize her safety and independence in daily tasks.  Recommend home health OT at discharge.     Follow Up Recommendations  Home health OT    Equipment Recommendations       Recommendations for Other Services       Precautions / Restrictions Precautions Precautions: Fall Precaution Comments: NPO Restrictions Weight Bearing Restrictions: No      Mobility Bed Mobility Overal bed mobility: Modified Independent       Supine to sit: Modified independent (Device/Increase time);HOB elevated Sit to supine: Supervision      Transfers Overall transfer level: Needs assistance Equipment used: Rolling walker (2 wheeled);None     Stand pivot transfers: Min guard       General transfer comment:  Patient requires min guard for acts in standing, toileting, at the sink    Balance                                           ADL either performed or assessed with clinical judgement   ADL Overall ADL's : Needs assistance/impaired Eating/Feeding: Modified independent   Grooming: Modified independent   Upper Body Bathing: Modified independent   Lower Body Bathing: Min guard   Upper Body Dressing : Modified independent   Lower Body Dressing: Min guard   Toilet Transfer: Min guard   Toileting- Clothing Manipulation and Hygiene: Min guard       Functional mobility during ADLs: Min guard General ADL Comments: Patient able to complete most of her basic self care tasks with min guard for safety, she reports feeling weak all over.  Patient slow to complete tasks, has slurred speech, right facial droop, heavy eye lid on the right.       Vision Baseline Vision/History: Wears glasses Patient Visual Report: Diplopia Additional Comments: Patient reported diplopia initially and had to cover the right side of her lens.  She denies any double vision at the moment but has a right heavy eyelid, able to read from a book with no difficulties this date. She does not have her lens covered today.      Perception     Praxis      Pertinent Vitals/Pain Pain  Assessment: No/denies pain     Hand Dominance Right   Extremity/Trunk Assessment Upper Extremity Assessment Upper Extremity Assessment: Generalized weakness   Lower Extremity Assessment Lower Extremity Assessment: Generalized weakness       Communication Communication Communication: No difficulties   Cognition Arousal/Alertness: Awake/alert Behavior During Therapy: WFL for tasks assessed/performed Overall Cognitive Status: Within Functional Limits for tasks assessed                                     General Comments       Exercises     Shoulder Instructions      Home Living  Family/patient expects to be discharged to:: Private residence Living Arrangements: Spouse/significant other Available Help at Discharge: Family Type of Home: House Home Access: Stairs to enter [employer] of Steps: one step to enter the back door and one step to get into the kitchen   Home Layout: One level     Bathroom Shower/Tub: Tub/shower unit;Curtain   Bathroom Toilet: Standard Bathroom Accessibility: Yes   Home Equipment: Grab bars - tub/shower;Cane - single point;Walker - 2 wheels   Additional Comments: has a walker and cane from when her mom visits from the nursing home.      Prior Functioning/Environment Level of Independence: Independent                 OT Problem List: Decreased strength;Decreased activity tolerance;Decreased knowledge of use of DME or AE      OT Treatment/Interventions: Self-care/ADL training;DME and/or AE instruction;Therapeutic activities;Therapeutic exercise;Patient/family education;Neuromuscular education;Visual/perceptual remediation/compensation    OT Goals(Current goals can be found in the care plan section) Acute Rehab OT Goals Patient Stated Goal: to do as much as I can and to go home OT Goal Formulation: With patient Time For Goal Achievement: 11/09/17 Potential to Achieve Goals: Good  OT Frequency: Min 1X/week   Barriers to D/C:            Co-evaluation              AM-PAC PT "6 Clicks" Daily Activity     Outcome Measure Help from another person eating meals?: None Help from another person taking care of personal grooming?: None Help from another person toileting, which includes using toliet, bedpan, or urinal?: A Little Help from another person bathing (including washing, rinsing, drying)?: A Little Help from another person to put on and taking off regular upper body clothing?: None Help from another person to put on and taking off regular lower body clothing?: A Little 6 Click Score: 21   End of  Session Equipment Utilized During Treatment: Gait belt;Rolling walker  Activity Tolerance: Patient tolerated treatment well Patient left: in bed;with call bell/phone within reach;with bed alarm set  OT Visit Diagnosis: Muscle weakness (generalized) (M62.81)                Time: 1510-1550 OT Time Calculation (min): 40 min Charges:  OT General Charges $OT Visit: 1 Visit OT Evaluation $OT Eval Low Complexity: 1 Low OT Treatments $Self Care/Home Management : 8-22 mins G-Codes:     Amy T Lovett, OTR/L, CLT   Lovett,Amy 10/29/2017, 4:09 PM

## 2017-10-29 NOTE — Care Management (Signed)
Physical therapy evaluation completed. Recommending home health and physical therapy in the home. May need PEG feeds. [employer], will update Floydene Flock, [facility] representative.  Shelbie Ammons RN MSN CCM Care Management [phone]

## 2017-10-29 NOTE — Progress Notes (Signed)
Dr Margaretmary Eddy notified of pt not wanting to have dobbhoff placed and starting tube feedings.

## 2017-10-29 NOTE — Progress Notes (Signed)
Physical Therapy Treatment Patient Details Name: Bethany Lara MRN: [id number] DOB: 02/24/1951 Today's Date: 10/29/2017    History of Present Illness Pt is a 67 year old female who was admitted to [facility] on 10/26/17.  Pt woke up feeling fine, but then developed intense vertigo (medication did not help); pt developed R sided facial droop and dysarthria.  Also presenting with dysphagia, double vision, and dizziness.  Pt reports that she had a similar episode in December and her MRI was fine. 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  Pt transferred to ICU PM of 10/27/17 with sudden onset respiratory distress; new PT consult received 10/28/17; pt also transferred out of ICU 10/28/17.  Pt failed swallow test 10/28/17.    PT Comments    Pt reporting dizziness but no double vision anymore.  Pt unable to close R eye and maintain eye closure (R facial droop also noted) but pt reported no dizziness if R eye was covered (pt placed tissue over R side of glasses); pt reporting her R lens fell out of her glasses yesterday; unsure if dizziness from discrepancy from vision without R lens in glasses or related to stroke.  Pt kept tissue over R side of glasses to eliminate dizziness during session.  Pt able to progress to ambulating 80 feet with RW CGA; decreased cadence noted; required use of RW d/t generalized weakness (pt NPO and with limited mobility during hospital stay).  Pt reports she has multiple family/friends that will stay with her upon hospital discharge to assist.  Recommend pt discharge home with HHPT and SBA for functional mobility for safety (ambulating with RW).  Current PT plan of care remains appropriate.    Follow Up Recommendations  Home health PT;Supervision for mobility/OOB     Equipment Recommendations  Rolling walker with 5" wheels(youth sized)    Recommendations for Other Services OT consult     Precautions / Restrictions Precautions Precautions: Fall Precaution Comments:  NPO Restrictions Weight Bearing Restrictions: No    Mobility  Bed Mobility Overal bed mobility: Modified Independent       Supine to sit: Modified independent (Device/Increase time);HOB elevated     General bed mobility comments: Supine to sit with mild increased effort.  Transfers Overall transfer level: Needs assistance Equipment used: Rolling walker (2 wheeled);None Transfers: Sit to/from [employer] to Stand: Min guard Stand pivot transfers: Min guard       General transfer comment: pt able to stand without UE support but with increased effort; improved stability noted standing with RW  Ambulation/Gait Ambulation/Gait assistance: Min guard Ambulation Distance (Feet): (20 feet; 80 feet) Assistive device: Rolling walker (2 wheeled)(IV pole)   Gait velocity: decreased   General Gait Details: pt held onto IV pole first trial (20 feet); 2nd trial (80 feet) pt used RW; improved steadiness and confidence noted ambulating with RW; overall pt still demonstrating partial step through B LE pattern   Stairs            Wheelchair Mobility    Modified Rankin (Stroke Patients Only)       Balance Overall balance assessment: Needs assistance Sitting-balance support: No upper extremity supported;Feet supported Sitting balance-Leahy Scale: Normal Sitting balance - Comments: steady sitting reaching outside BOS   Standing balance support: No upper extremity supported Standing balance-Leahy Scale: Good Standing balance comment: steady standing reaching within BOS (pt appearing more cautious with activity)  Cognition Arousal/Alertness: Awake/alert Behavior During Therapy: WFL for tasks assessed/performed Overall Cognitive Status: Within Functional Limits for tasks assessed                                        Exercises      General Comments General comments (skin integrity, edema, etc.): Pt  resting in bed upon PT entry.  Nursing cleared pt for participation in physical therapy.  Pt agreeable to PT session.      Pertinent Vitals/Pain Pain Assessment: No/denies pain  Vitals (HR and O2 on room air) stable throughout treatment session.    Home Living                      Prior Function            PT Goals (current goals can now be found in the care plan section) Acute Rehab PT Goals Patient Stated Goal: to go home PT Goal Formulation: With patient Time For Goal Achievement: 11/12/17 Potential to Achieve Goals: Good Progress towards PT goals: Progressing toward goals    Frequency    7X/week      PT Plan Discharge plan needs to be updated    Co-evaluation              AM-PAC PT "6 Clicks" Daily Activity  Outcome Measure  Difficulty turning over in bed (including adjusting bedclothes, sheets and blankets)?: None Difficulty moving from lying on back to sitting on the side of the bed? : A Little Difficulty sitting down on and standing up from a chair with arms (e.g., wheelchair, bedside commode, etc,.)?: A Little Help needed moving to and from a bed to chair (including a wheelchair)?: A Little Help needed walking in hospital room?: A Little Help needed climbing 3-5 steps with a railing? : A Little 6 Click Score: 19    End of Session Equipment Utilized During Treatment: Gait belt Activity Tolerance: Patient tolerated treatment well Patient left: in chair;with call bell/phone within reach;with chair alarm set Nurse Communication: Mobility status;Precautions PT Visit Diagnosis: Other abnormalities of gait and mobility (R26.89);Difficulty in walking, not elsewhere classified (R26.2);Dizziness and giddiness (R42)     Time: 9147-8295 PT Time Calculation (min) (ACUTE ONLY): 38 min  Charges:  $Gait Training: 23-37 mins $Therapeutic Activity: 8-22 mins                    G CodesLeitha Bleak, PT 10/29/17, 10:02 AM [phone]

## 2017-10-29 NOTE — Progress Notes (Signed)
Patient ID: Bethany Lara, female   DOB: 10/07/1950, 67 y.o.   MRN: [id number]  [employer] PROGRESS NOTE  Bethany Lara QIO:[id number] DOB: 11-May-1951 DOA: 10/26/2017 PCP: Kathrine Haddock, NP  HPI/Subjective: Patient presents with trouble speaking, trouble swallowing and blurred vision.  She also has some dizziness.   Today patient's speech is better.  Patient failed swallow evaluation, currently strict n.p.o. agreeable with PEG tube   Objective: Vitals:   10/29/17 0819 10/29/17 1415  BP: (!) 145/63 (!) 126/55  Pulse: (!) 54 (!) 53  Resp: 20   Temp: 97.7 F (36.5 C) 98.6 F (37 C)  SpO2:  95%    Filed Weights   10/26/17 1553 10/27/17 2200 10/29/17 0556  Weight: 77.1 kg (170 lb) 78.9 kg (173 lb 15.1 oz) 80.6 kg (177 lb 12.8 oz)    ROS: Review of Systems  Constitutional: Negative for chills and fever.  Eyes: Negative for blurred vision.  Respiratory: Negative for cough and shortness of breath.   Cardiovascular: Negative for chest pain.  Gastrointestinal: Negative for abdominal pain, constipation, diarrhea, nausea and vomiting.  Genitourinary: Negative for dysuria.  Musculoskeletal: Negative for joint pain.  Neurological: Negative for dizziness and headaches.   Exam: Physical Exam  Constitutional: She is oriented to person, place, and time.  HENT:  Nose: No mucosal edema.  Mouth/Throat: No oropharyngeal exudate or posterior oropharyngeal edema.  Eyes: Conjunctivae, EOM and lids are normal. Pupils are equal, round, and reactive to light.  Neck: No JVD present. Carotid bruit is not present. No edema present. No thyroid mass and no thyromegaly present.  Cardiovascular: S1 normal and S2 normal. Exam reveals no gallop.  No murmur heard. Pulses:      Dorsalis pedis pulses are 2+ on the right side, and 2+ on the left side.  Respiratory: No respiratory distress. She has no wheezes. She has no rhonchi. She has no rales.  GI: Soft. Bowel sounds are normal. There is  no tenderness.  Musculoskeletal:       Right shoulder: She exhibits no swelling.  Lymphadenopathy:    She has no cervical adenopathy.  Neurological: She is alert and oriented to person, place, and time.  Right facial droop.  As per patient slurred speech.  Difficulty swallowing her own saliva.  Power 5 out of 5 upper and lower extremities  Skin: Skin is warm. No rash noted. Nails show no clubbing.  Psychiatric: She has a normal mood and affect.      Data Reviewed: Basic Metabolic Panel: Recent Labs  Lab 10/26/17 1316 10/27/17 0631 10/28/17 0433  NA 140 140 140  K 4.1 3.8 3.6  CL 107 109 107  CO2 26 21* 25  GLUCOSE 120* 118* 216*  BUN 15 14 18   CREATININE 0.87 0.85 0.85  CALCIUM 9.5 9.2 9.2  MG  --   --  2.4  PHOS  --   --  3.1   Liver Function Tests: Recent Labs  Lab 10/26/17 1316 10/27/17 0631  AST 26 23  ALT 25 23  ALKPHOS 61 55  BILITOT 0.8 0.6  PROT 7.5 7.0  ALBUMIN 4.6 4.1   CBC: Recent Labs  Lab 10/26/17 1224 10/27/17 0631 10/28/17 0433  WBC 7.1 10.2 12.4*  NEUTROABS 5.1  --   --   HGB 15.4 14.5 15.2  HCT 45.7 42.4 45.2  MCV 87.5 87.5 86.9  PLT 283 301 288   Cardiac Enzymes: Recent Labs  Lab 10/26/17 1224  TROPONINI <0.03  CBG: Recent Labs  Lab 10/28/17 2046 10/29/17 0112 10/29/17 0440 10/29/17 0738 10/29/17 1144  GLUCAP 91 101* 102* 85 108*      Studies: Ct Head Wo Contrast  Result Date: 10/27/2017 CLINICAL DATA:  Patient with slurred speech and difficulty swallowing. EXAM: CT HEAD WITHOUT CONTRAST TECHNIQUE: Contiguous axial images were obtained from the base of the skull through the vertex without intravenous contrast. 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 FINDINGS: Brain: No evidence of acute infarction, hemorrhage, hydrocephalus, extra-axial collection or mass lesion/mass effect. Mild atrophy. No significant white matter hypoattenuation. Vascular: Calcification of the cavernous internal  carotid arteries and distal vertebral arteries consistent with cerebrovascular atherosclerotic disease. No signs of intracranial large vessel occlusion. Skull: Negative for fracture. 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 Sinuses/Orbits: No acute finding. Other: None. Compared with the prior study, the medullary infarct is now faintly visible. There is no hemorrhagic transformation. IMPRESSION: Developing cytotoxic edema within the small RIGHT lateral medullary infarct. No hemorrhagic transformation. Electronically Signed   By: Staci Righter M.D.   On: 10/27/2017 21:53   Dg Chest Port 1 View  Result Date: 10/27/2017 CLINICAL DATA:  Acute respiratory failure EXAM: PORTABLE CHEST 1 VIEW COMPARISON:  April 23, 2009 FINDINGS: The heart size and mediastinal contours are within normal limits. Both lungs are clear. The visualized skeletal structures are unremarkable. IMPRESSION: No active disease. Electronically Signed   By: Dorise Bullion III M.D   On: 10/27/2017 21:44    Scheduled Meds: . aspirin  300 mg Rectal Daily  . docusate sodium  100 mg Oral BID  . enoxaparin (LOVENOX) injection  40 mg Subcutaneous Q24H  . fluticasone  2 spray Each Nare BID  . pantoprazole (PROTONIX) IV  40 mg Intravenous Q12H   Continuous Infusions: . ampicillin-sulbactam (UNASYN) IV Stopped (10/29/17 1402)  . dextrose 25 mL/hr at 10/28/17 1515  . feeding supplement (JEVITY 1.2 CAL)      Assessment/Plan:  1. Acute CVA- medulla. Patient with slurred speech, Trouble swallowing, blurred vision at the time of admission.  Blurry vision improved.  Continue rectal aspirin.  Failed speech evaluation .  GI is consulted Dr. Marius Ditch is going to place PEG tube tomorrow.  Meanwhile we will place a Dobbhoff and start her on  tube feeds.  Neurology is following.  Echocardiogram study done, results pending.  Physical therapy recommending home health PT unable to give statin at this time because patient unable to swallow. Carotid ultrasound  unremarkable. 2. Bradycardia.  hr always been around 60 according to the husband.  Cardiology Dr. Saralyn Pilar is following.  Patient is a symptomatic ,recommending 2D echocardiogram , study done results are pending at this time 3. Hyperlipidemia unspecified unable to give statin while unable to swallow. 4. Impaired fasting glucose.  Patient is not a diabetic with hemoglobin A1c being 5.5. 5.  Hypoglycemia on D5 and wean off as tolerated, once patient is started on Dobbhoff feeds Code Status:     Code Status Orders  (From admission, onward)        Start     Ordered   10/26/17 1807  Full code  Continuous     10/26/17 1806    Code Status History    Date Active Date Inactive Code Status Order ID Comments User Context   05/20/2017 16:49 05/21/2017 16:26 Full Code [id number]  Nicholes Mango, MD Inpatient    Advance Directive Documentation     Most Recent Value  Type of Advance Directive  Healthcare Power of Attorney, Living will  Pre-existing out of facility DNR order (yellow form or pink MOST form)  No data  "MOST" Form in Place?  No data     Disposition Plan: To be determined  Consultants:  Neurology  Cardiology  Time spent: 32 minutes  Illene Silver Ivo Moga  [employer]

## 2017-10-30 ENCOUNTER — Encounter
Admission: EM | Disposition: A | Discharge status: Home Health | Payer: Self-pay | Source: Home / Self Care | Attending: Internal Medicine

## 2017-10-30 ENCOUNTER — Encounter: Payer: Self-pay | Admitting: Anesthesiology

## 2017-10-30 ENCOUNTER — Inpatient Hospital Stay: Payer: Medicare Other | Admitting: Anesthesiology

## 2017-10-30 HISTORY — PX: PEG PLACEMENT: SHX5437

## 2017-10-30 LAB — ECHOCARDIOGRAM COMPLETE
HEIGHTINCHES: 61 in
Weight: 2783.09 oz

## 2017-10-30 LAB — GLUCOSE, CAPILLARY
GLUCOSE-CAPILLARY: 109 mg/dL — AB (ref 65–99)
GLUCOSE-CAPILLARY: 119 mg/dL — AB (ref 65–99)
GLUCOSE-CAPILLARY: 131 mg/dL — AB (ref 65–99)
Glucose-Capillary: 87 mg/dL (ref 65–99)
Glucose-Capillary: 94 mg/dL (ref 65–99)
Glucose-Capillary: 99 mg/dL (ref 65–99)

## 2017-10-30 SURGERY — INSERTION, PEG TUBE
Anesthesia: General

## 2017-10-30 MED ORDER — SODIUM CHLORIDE 0.9 % IV SOLN
1.5000 g | Freq: Four times a day (QID) | INTRAVENOUS | Status: DC
  Filled 2017-10-30 (×3): qty 1.5

## 2017-10-30 MED ORDER — DEXAMETHASONE SODIUM PHOSPHATE 4 MG/ML IJ SOLN
INTRAMUSCULAR | Status: DC | PRN
  Administered 2017-10-30: 10 mg via INTRAVENOUS

## 2017-10-30 MED ORDER — PROPOFOL 500 MG/50ML IV EMUL
INTRAVENOUS | Status: DC | PRN
  Administered 2017-10-30: 125 ug/kg/min via INTRAVENOUS

## 2017-10-30 MED ORDER — SODIUM CHLORIDE 0.9 % IV SOLN
INTRAVENOUS | Status: DC | PRN
  Administered 2017-10-30: 14:00:00 via INTRAVENOUS

## 2017-10-30 MED ORDER — GLYCOPYRROLATE 0.2 MG/ML IJ SOLN
INTRAMUSCULAR | Status: DC | PRN
  Administered 2017-10-30: 0.2 mg via INTRAVENOUS

## 2017-10-30 MED ORDER — PROPOFOL 10 MG/ML IV BOLUS
INTRAVENOUS | Status: DC | PRN
  Administered 2017-10-30: 30 mg via INTRAVENOUS
  Administered 2017-10-30: 20 mg via INTRAVENOUS
  Administered 2017-10-30: 50 mg via INTRAVENOUS

## 2017-10-30 MED ORDER — JEVITY 1.2 CAL PO LIQD
237.0000 mL | Freq: Two times a day (BID) | ORAL | Status: AC
  Administered 2017-10-31 (×2): 237 mL

## 2017-10-30 NOTE — Progress Notes (Signed)
Patient ID: Bethany Lara, female   DOB: 1951/03/03, 67 y.o.   MRN: [id number]  [facility] PROGRESS NOTE  Bethany Lara [facility]:[id number] DOB: 10-13-1950 DOA: 10/26/2017 PCP: Kathrine Haddock, NP  HPI/Subjective: Patient presents with trouble speaking, trouble swallowing and blurred vision.  She also has some dizziness.   Today patient's speech is better.  scheduled PEG tube placement today   Objective: Vitals:   10/30/17 1724 10/30/17 2057  BP: (!) 148/71 131/68  Pulse: 62 61  Resp: 20 18  Temp: 98 F (36.7 C) 98.2 F (36.8 C)  SpO2: 93% 95%    Filed Weights   10/29/17 0556 10/30/17 0359 10/30/17 1340  Weight: 80.6 kg (177 lb 12.8 oz) 80.1 kg (176 lb 9.6 oz) 79.8 kg (176 lb)    ROS: Review of Systems  Constitutional: Negative for chills and fever.  Eyes: Negative for blurred vision.  Respiratory: Negative for cough and shortness of breath.   Cardiovascular: Negative for chest pain.  Gastrointestinal: Negative for abdominal pain, constipation, diarrhea, nausea and vomiting.  Genitourinary: Negative for dysuria.  Musculoskeletal: Negative for joint pain.  Neurological: Negative for dizziness and headaches.   Exam: Physical Exam  Constitutional: She is oriented to person, place, and time.  HENT:  Nose: No mucosal edema.  Mouth/Throat: No oropharyngeal exudate or posterior oropharyngeal edema.  Eyes: Conjunctivae, EOM and lids are normal. Pupils are equal, round, and reactive to light.  Neck: No JVD present. Carotid bruit is not present. No edema present. No thyroid mass and no thyromegaly present.  Cardiovascular: S1 normal and S2 normal. Exam reveals no gallop.  No murmur heard. Pulses:      Dorsalis pedis pulses are 2+ on the right side, and 2+ on the left side.  Respiratory: No respiratory distress. She has no wheezes. She has no rhonchi. She has no rales.  GI: Soft. Bowel sounds are normal. There is no tenderness.  Musculoskeletal:       Right shoulder:  She exhibits no swelling.  Lymphadenopathy:    She has no cervical adenopathy.  Neurological: She is alert and oriented to person, place, and time.  Right facial droop.  As per patient slurred speech.  Difficulty swallowing her own saliva.  Power 5 out of 5 upper and lower extremities  Skin: Skin is warm. No rash noted. Nails show no clubbing.  Psychiatric: She has a normal mood and affect.      Data Reviewed: Basic Metabolic Panel: Recent Labs  Lab 10/26/17 1316 10/27/17 0631 10/28/17 0433  NA 140 140 140  K 4.1 3.8 3.6  CL 107 109 107  CO2 26 21* 25  GLUCOSE 120* 118* 216*  BUN 15 14 18   CREATININE 0.87 0.85 0.85  CALCIUM 9.5 9.2 9.2  MG  --   --  2.4  PHOS  --   --  3.1   Liver Function Tests: Recent Labs  Lab 10/26/17 1316 10/27/17 0631  AST 26 23  ALT 25 23  ALKPHOS 61 55  BILITOT 0.8 0.6  PROT 7.5 7.0  ALBUMIN 4.6 4.1   CBC: Recent Labs  Lab 10/26/17 1224 10/27/17 0631 10/28/17 0433  WBC 7.1 10.2 12.4*  NEUTROABS 5.1  --   --   HGB 15.4 14.5 15.2  HCT 45.7 42.4 45.2  MCV 87.5 87.5 86.9  PLT 283 301 288   Cardiac Enzymes: Recent Labs  Lab 10/26/17 1224  TROPONINI <0.03    CBG: Recent Labs  Lab 10/30/17 0356 10/30/17 0740  10/30/17 1134 10/30/17 1703 10/30/17 2100  GLUCAP 99 87 109* 119* 131*      Studies: No results found.  Scheduled Meds: . aspirin  300 mg Rectal Daily  . docusate sodium  100 mg Oral BID  . enoxaparin (LOVENOX) injection  40 mg Subcutaneous Q24H  . [START ON 10/31/2017] feeding supplement (JEVITY 1.2 CAL)  237 mL Per Tube BID  . fluticasone  2 spray Each Nare BID  . pantoprazole (PROTONIX) IV  40 mg Intravenous Q12H   Continuous Infusions: . dextrose 25 mL/hr at 10/30/17 1700    Assessment/Plan:  1. Acute CVA- medulla. Patient with slurred speech, Trouble swallowing, blurred vision at the time of admission.  Blurry vision improved.  Continue rectal aspirin.  Failed speech evaluation .  GI is consulted Dr.  Marius Ditch  placed PEG tube today . water and meds today and feed tomorrow Neurology is following.  Echocardiogram - Physical therapy recommending home health PT unable to give statin at this time because patient unable to swallow. Carotid ultrasound unremarkable. 2. Bradycardia.  hr always been around 60 according to the husband.  Cardiology Dr. Saralyn Pilar is following.  Patient is a symptomatic ,recommending 2D echocardiogram -60-65 percent ef,no cardiac source of emboli   3. Hyperlipidemia unspecified unable to give statin while unable to swallow.will start via PEG 4. Impaired fasting glucose.  Patient is not a diabetic with hemoglobin A1c being 5.5. 5.  Hypoglycemia on D5 and wean off,pt refused Dobbhoff feeds Code Status:     Code Status Orders  (From admission, onward)        Start     Ordered   10/26/17 1807  Full code  Continuous     10/26/17 1806    Code Status History    Date Active Date Inactive Code Status Order ID Comments User Context   05/20/2017 16:49 05/21/2017 16:26 Full Code [id number]  Nicholes Mango, MD Inpatient    Advance Directive Documentation     Most Recent Value  Type of Advance Directive  Healthcare Power of [place], Living will  Pre-existing out of facility DNR order (yellow form or pink MOST form)  No data  "MOST" Form in Place?  No data     Disposition Plan: To be determined  Consultants:  Neurology  Cardiology  Time spent: 32 minutes  Bethany Lara  [employer]

## 2017-10-30 NOTE — Anesthesia Procedure Notes (Signed)
Date/Time: 10/30/2017 2:08 PM Performed by: Nelda Marseille, CRNA Pre-anesthesia Checklist: Patient identified, Emergency Drugs available, Suction available, Patient being monitored and Timeout performed Oxygen Delivery Method: Nasal cannula

## 2017-10-30 NOTE — Progress Notes (Addendum)
Initial Nutrition Assessment  DOCUMENTATION CODES:   Obesity unspecified  INTERVENTION:  PEG tube will be ready to use 10/31/2017 at approximately 1430. On 2/7 recommend providing 1 can of Jevity 1.2 at 1430 and another at 1830.  On 2/8 can advance to goal regimen of Jevity 1.2 Cal 2 cans TID (total of 6 cans daily) via PEG tube. Provides 1710 kcal, 79 grams of protein, 1146 mL H2O, 25.8 grams fiber daily.  Recommend free water flush of 60 mL before and after each bolus feeding. Provides total of 1506 mL water daily including water in tube feeding. Will monitor hydration status and adjust as needed.  Goal TF regimen meets 100% RDIs for vitamins/minerals.  Of note, patient has not yet had a bowel movement documented this admission. Consider more aggressive bowel regimen if patient does not have bowel movement with initiation of tube feeds.  NUTRITION DIAGNOSIS:   Inadequate oral intake related to inability to eat, dysphagia, acute illness(acute right lateral medullary CVA) as evidenced by NPO status.  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   -Patient underwent SLP evaluation on 2/4 with videoflouroscopic evaluation of swallow and upper esophageal phase. Found to have severe aspiration risk and SLP recommended NPO status with alternative means for nutrition and medications long-term. -GI was consulted for PEG placement.  Met with patient and her husband at bedside. She reports that PTA she had a good appetite up until her stroke. She had her stroke Saturday morning. Her last meal was Friday  evening (hot dog with no bun and salad) and she has been NPO since. She was intentionally losing weight with a low-carbohydrate/Atkins diet. Patient reports the only symptoms from her stroke are her dysphagia and right facial droop. She has good movement of her extremities though she is somewhat weak.  UBW 185 lbs. Per chart she was 185.2 lbs on 09/06/2017 and has lost 8.6 lbs (4.6% body weight) over the past 2 months, which is not significant for time frame. Weight loss was intentional.  Access: externally removable 22 Fr. PEG tube placed 2/6  Medications reviewed and include: Colace, pantoprazole, D5W at 25 mL/hr (30 grams dextrose, 102 kcal daily).  Labs reviewed: CBG 87-109.  Patient does not meet criteria for malnutrition at this time.  Discussed with RN.  NUTRITION - FOCUSED PHYSICAL EXAM:    Most Recent Value  Orbital Region  No depletion  Upper Arm Region  No depletion  Thoracic and Lumbar Region  No depletion  Buccal Region  No depletion  Temple Region  No depletion  Clavicle Bone Region  No depletion  Clavicle and Acromion Bone Region  No depletion  Scapular Bone Region  No depletion  Dorsal Hand  No depletion  Patellar Region  No depletion  Anterior Thigh Region  No depletion  Posterior Calf Region  No depletion  Edema (RD Assessment)  None  Hair  Reviewed  Eyes  Reviewed  Mouth  Reviewed  Skin  Reviewed  Nails  Reviewed     Diet Order:  Aspiration precautions Diet NPO time specified  EDUCATION NEEDS:     Education needs have been addressed  Skin:  Skin Assessment: Reviewed RN Assessment  Last BM:  PTA (10/25/2017)  Height:   Ht Readings from Last 1 Encounters:  10/30/17 5' 1" (1.549 m)    Weight:   Wt Readings from Last 1 Encounters:  10/30/17 176 lb (79.8 kg)    Ideal Body Weight:  47.7 kg  BMI:  Body mass index is 33.25 kg/m.  Estimated Nutritional Needs:   Kcal:  1650-1905 (MSJ x 1.3-1.5)  Protein:  80-95 grams (1-1.2 grams/kg)  Fluid:   1.4-1.7 L/day (30-35 mL/kg IBW)   Stephens, MS, RD, LDN Office: [phone] Pager: [phone] After Hours/Weekend Pager: [phone]  

## 2017-10-30 NOTE — Clinical Social Work Note (Signed)
CSW received referral for SNF.  Case discussed with case manager and plan is to discharge home with home health.  CSW to sign off please re-consult if social work needs arise.  Denae Zulueta R. Legacie Dillingham, MSW, LCSWA [phone]  

## 2017-10-30 NOTE — Anesthesia Post-op Follow-up Note (Signed)
Anesthesia QCDR form completed.        

## 2017-10-30 NOTE — Anesthesia Preprocedure Evaluation (Addendum)
Anesthesia Evaluation  Patient identified by MRN, date of birth, ID band Patient awake    Reviewed: Allergy & Precautions, H&P , NPO status , Patient's Chart, lab work & pertinent test results  History of Anesthesia Complications Negative for: history of anesthetic complications  Airway Mallampati: III  TM Distance: >3 FB Neck ROM: full    Dental  (+) Chipped   Pulmonary neg pulmonary ROS, neg shortness of breath,           Cardiovascular Exercise Tolerance: Good (-) angina(-) Past MI negative cardio ROS       Neuro/Psych  Neuromuscular disease CVA, Residual Symptoms negative psych ROS   GI/Hepatic negative GI ROS, Neg liver ROS, neg GERD  ,  Endo/Other  negative endocrine ROS  Renal/GU Renal disease  negative genitourinary   Musculoskeletal   Abdominal   Peds  Hematology negative hematology ROS (+)   Anesthesia Other Findings Patient is NPO appropriate and reports no nausea or vomiting today.   Past Medical History: No date: Allergy No date: Carpal tunnel syndrome No date: Hyperlipidemia  Past Surgical History: No date: BUNIONECTOMY 11/14/2015: CARPAL TUNNEL RELEASE No date: CHOLECYSTECTOMY  BMI    Body Mass Index:  33.37 kg/m      Reproductive/Obstetrics negative OB ROS                            Anesthesia Physical Anesthesia Plan  ASA: III  Anesthesia Plan: General   Post-op Pain Management:    Induction: Intravenous  PONV Risk Score and Plan: Midazolam and Propofol infusion  Airway Management Planned: Natural Airway and Nasal Cannula  Additional Equipment:   Intra-op Plan:   Post-operative Plan:   Informed Consent: I have reviewed the patients History and Physical, chart, labs and discussed the procedure including the risks, benefits and alternatives for the proposed anesthesia with the patient or authorized representative who has indicated his/her  understanding and acceptance.   Dental Advisory Given  Plan Discussed with: Anesthesiologist, CRNA and Surgeon  Anesthesia Plan Comments: (PEG required for feeding. Recent stroke, patient consented for risk of worsening stroke symptoms or new stroke from her anesthetic this close to her stroke.  Patient voiced understanding.  Patient consented for risks of anesthesia including but not limited to:  - adverse reactions to medications - risk of intubation if required - damage to teeth, lips or other oral mucosa - sore throat or hoarseness - Damage to heart, brain, lungs or loss of life  Patient voiced understanding.)       Anesthesia Quick Evaluation

## 2017-10-30 NOTE — Anesthesia Postprocedure Evaluation (Signed)
Anesthesia Post Note  Patient: Bethany Lara  Procedure(s) Performed: PERCUTANEOUS ENDOSCOPIC GASTROSTOMY (PEG) PLACEMENT (N/A )  Patient location during evaluation: Endoscopy Anesthesia Type: General Level of consciousness: awake and alert Pain management: pain level controlled Vital Signs Assessment: post-procedure vital signs reviewed and stable Respiratory status: spontaneous breathing, nonlabored ventilation, respiratory function stable and patient connected to nasal cannula oxygen Cardiovascular status: blood pressure returned to baseline and stable Postop Assessment: no apparent nausea or vomiting Anesthetic complications: no     Last Vitals:  Vitals:   10/30/17 1420 10/30/17 1430  BP:    Pulse:    Resp:  18  Temp: 36.4 C   SpO2:      Last Pain:  Vitals:   10/30/17 1430  TempSrc:   PainSc: Asleep                 Alphonsus Sias

## 2017-10-30 NOTE — Progress Notes (Signed)
Physical Therapy Treatment Patient Details Name: Bethany Lara MRN: [id number] DOB: [birth year] Today's Date: 10/30/2017    History of Present Illness Pt is a 67 year old female who was admitted to [facility] on 10/26/17.  Pt woke up feeling fine, but then developed intense vertigo (medication did not help); pt developed R sided facial droop and dysarthria.  Also presenting with dysphagia, double vision, and dizziness.  Pt reports that she had a similar episode in December and her MRI was fine. 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  Pt transferred to ICU PM of 10/27/17 with sudden onset respiratory distress; pt also transferred out of ICU 10/28/17.  Pt failed swallow test 10/28/17.    PT Comments    Pt able to progress to ambulating 140 feet with RW CGA; pt steady ambulating with RW but decreased cadence noted with distance ambulated/fatigue.  Overall pt continues to demonstrate generalized weakness.  Pt appears motivated to improve strength and functional status during session.  Pt reports plan for PEG tube placement today.  C/o mild dizziness today but improved compared to yesterday (pt wearing different pair of glasses with both lenses intact); also R sided facial weakness continues to be noted.  Will continue to progress pt with strengthening, balance, and increasing independence with functional mobility during hospitalization.    Follow Up Recommendations  Home health PT;Supervision for mobility/OOB     Equipment Recommendations  Rolling walker with 5" wheels(youth sized)    Recommendations for Other Services OT consult     Precautions / Restrictions Precautions Precautions: Fall Precaution Comments: NPO Restrictions Weight Bearing Restrictions: No    Mobility  Bed Mobility Overal bed mobility: Modified Independent       Supine to sit: Modified independent (Device/Increase time);HOB elevated     General bed mobility comments: Supine to sit with mild increased  effort.  Transfers Overall transfer level: Modified independent Equipment used: Rolling walker (2 wheeled) Transfers: Sit to/from Stand Sit to Stand: Modified independent (Device/Increase time) Stand pivot transfers: Supervision       General transfer comment: mild increased effort to stand with RW but steady with RW  Ambulation/Gait Ambulation/Gait assistance: Min guard Ambulation Distance (Feet): 140 Feet Assistive device: Rolling walker (2 wheeled)       General Gait Details: partial step through gait pattern; fair gait speed initially but decreased with distance/fatigue   Stairs            Wheelchair Mobility    Modified Rankin (Stroke Patients Only)       Balance Overall balance assessment: Needs assistance Sitting-balance support: No upper extremity supported;Feet supported Sitting balance-Leahy Scale: Normal Sitting balance - Comments: steady sitting reaching outside BOS   Standing balance support: No upper extremity supported Standing balance-Leahy Scale: Good Standing balance comment: steady standing reaching within BOS (pt appearing more cautious with activity)                            Cognition Arousal/Alertness: Awake/alert Behavior During Therapy: WFL for tasks assessed/performed Overall Cognitive Status: Within Functional Limits for tasks assessed                                        Exercises      General Comments General comments (skin integrity, edema, etc.): Pt resting in bed upon PT entry.  Nursing cleared pt  for participation in physical therapy.  Pt agreeable to PT session.      Pertinent Vitals/Pain Pain Assessment: No/denies pain  Vitals (HR and O2 on room air) stable and WFL throughout treatment session.    Home Living                      Prior Function            PT Goals (current goals can now be found in the care plan section) Acute Rehab PT Goals Patient Stated Goal: to  improve mobility and go home PT Goal Formulation: With patient Time For Goal Achievement: 11/12/17 Potential to Achieve Goals: Good Progress towards PT goals: Progressing toward goals    Frequency    7X/week      PT Plan Current plan remains appropriate    Co-evaluation              AM-PAC PT "6 Clicks" Daily Activity  Outcome Measure  Difficulty turning over in bed (including adjusting bedclothes, sheets and blankets)?: None Difficulty moving from lying on back to sitting on the side of the bed? : A Little Difficulty sitting down on and standing up from a chair with arms (e.g., wheelchair, bedside commode, etc,.)?: A Little Help needed moving to and from a bed to chair (including a wheelchair)?: A Little Help needed walking in hospital room?: A Little Help needed climbing 3-5 steps with a railing? : A Little 6 Click Score: 19    End of Session Equipment Utilized During Treatment: Gait belt Activity Tolerance: Patient tolerated treatment well Patient left: in chair;with call bell/phone within reach;with chair alarm set Nurse Communication: Mobility status;Precautions PT Visit Diagnosis: Other abnormalities of gait and mobility (R26.89);Difficulty in walking, not elsewhere classified (R26.2);Dizziness and giddiness (R42)     Time: 8177-1165 PT Time Calculation (min) (ACUTE ONLY): 23 min  Charges:  $Therapeutic Exercise: 8-22 mins $Therapeutic Activity: 8-22 mins                    G CodesLeitha Bleak, PT 10/30/17, 9:12 AM [phone]

## 2017-10-30 NOTE — Op Note (Signed)
[facility] Gastroenterology Patient Name: Bethany Lara Procedure Date: 10/30/2017 1:39 PM MRN: [id number] Account #: [id number] Date of Birth: April 23, 1951 Admit Type: Inpatient Age: 67 Room: [facility] ENDO ROOM 1 Gender: Female Note Status: Finalized Procedure:            Upper GI endoscopy Indications:          Pharyngeal phase dysphagia, Neurogenic dysphagia Providers:            Lin Landsman MD, MD Referring MD:         Kathrine Haddock (Referring MD) Medicines:            Monitored Anesthesia Care Complications:        No immediate complications. Estimated blood loss:                        Minimal. Procedure:            Pre-Anesthesia Assessment:                       - Prior to the procedure, a History and Physical was                        performed, and patient medications and allergies were                        reviewed. The patient is competent. The risks and                        benefits of the procedure and the sedation options and                        risks were discussed with the patient. All questions                        were answered and informed consent was obtained.                        Patient identification and proposed procedure were                        verified by the physician, the nurse, the                        anesthesiologist, the anesthetist and the technician in                        the pre-procedure area in the procedure room. Mental                        Status Examination: alert and oriented. Airway                        Examination: normal oropharyngeal airway and neck                        mobility. Respiratory Examination: clear to                        auscultation. CV Examination: normal. Prophylactic  Antibiotics: The patient requires prophylactic                        antibiotics for planned PEG placement. The patient                        received antibiotic therapy today,  before the procedure                        started. Prior Anticoagulants: The patient has taken                        aspirin, last dose was 1 day prior to procedure. ASA                        Grade Assessment: III - A patient with severe systemic                        disease. After reviewing the risks and benefits, the                        patient was deemed in satisfactory condition to undergo                        the procedure. The anesthesia plan was to use monitored                        anesthesia care (MAC). Immediately prior to                        administration of medications, the patient was                        re-assessed for adequacy to receive sedatives. The                        heart rate, respiratory rate, oxygen saturations, blood                        pressure, adequacy of pulmonary ventilation, and                        response to care were monitored throughout the                        procedure. The physical status of the patient was                        re-assessed after the procedure.                       After obtaining informed consent, the endoscope was                        passed under direct vision. Throughout the procedure,                        the patient's blood pressure, pulse, and oxygen  saturations were monitored continuously. The Endoscope                        was introduced through the mouth, and advanced to the                        second part of duodenum. The upper GI endoscopy was                        accomplished without difficulty. The patient tolerated                        the procedure well. Findings:      The duodenal bulb and second portion of the duodenum were normal.      The entire examined stomach was normal. Placement of an externally       removable 22 French PEG with no T-fasteners was successfully completed.       The external bumper was at the 3.0 cm marking on the tube.  Estimated       blood loss was minimal.      The cardia and gastric fundus were normal on retroflexion.      The gastroesophageal junction and examined esophagus were normal. Impression:           - Normal duodenal bulb and second portion of the                        duodenum.                       - Normal stomach.                       - Normal gastroesophageal junction and esophagus.                       - An externally removable PEG placement was                        successfully completed.                       - No specimens collected. Recommendation:       - Return patient to hospital ward for ongoing care.                       - Please follow the post-PEG recommendations including:                        Nutrition consult for formula and volume, NPO x4 hrs                        then water today, may use PEG today for meds and water,                        flush PEG daily with 60 ml water, check site for                        bleeding q 4 hrs and clean site with soap and water  daily and dry thoroughly. Procedure Code(s):    --- Professional ---                       [id number], Esophagogastroduodenoscopy, flexible, transoral;                        with directed placement of percutaneous gastrostomy tube Diagnosis Code(s):    --- Professional ---                       R13.13, Dysphagia, pharyngeal phase                       R13.19, Other dysphagia CPT copyright 2016 American Medical Association. All rights reserved. The codes documented in this report are preliminary and upon coder review may  be revised to meet current compliance requirements. Dr. Ulyess Mort Lin Landsman MD, MD 10/30/2017 2:13:22 PM This report has been signed electronically. Number of Addenda: 0 Note Initiated On: 10/30/2017 1:39 PM      [facility]

## 2017-10-30 NOTE — Progress Notes (Signed)
Post EGD/PEG note:  Impression: - Normal duodenal bulb and second portion of the duodenum. - Normal stomach. - Normal gastroesophageal junction and esophagus. - An externally removable 37F PEG placement was successfully completed. - No specimens collected.  Recommendation: - Return patient to hospital ward for ongoing care. - Please follow the post-PEG recommendations including: Nutrition consult for formula and volume, NPO x4 hrs then water today, may use PEG today for meds and water, flush PEG daily with 60 ml water, check site for bleeding q 4 hrs and clean site with soap and water daily and dry thoroughly.  Cephas Darby, MD [address]  [place]  [place], [place] [address]  Main: [phone]  Fax: [phone] Pager: [phone]

## 2017-10-30 NOTE — Transfer of Care (Signed)
Immediate Anesthesia Transfer of Care Note  Patient: Bethany Lara  Procedure(s) Performed: PERCUTANEOUS ENDOSCOPIC GASTROSTOMY (PEG) PLACEMENT (N/A )  Patient Location: PACU  Anesthesia Type:General  Level of Consciousness: sedated  Airway & Oxygen Therapy: Patient Spontanous Breathing and Patient connected to nasal cannula oxygen  Post-op Assessment: Report given to RN and Post -op Vital signs reviewed and stable  Post vital signs: Reviewed and stable  Last Vitals:  Vitals:   10/30/17 1340 10/30/17 1420  BP: 136/83   Pulse: (!) 104   Resp: 20   Temp: (!) 36.3 C 36.4 C  SpO2: 98%     Last Pain:  Vitals:   10/30/17 1420  TempSrc: Tympanic  PainSc: Asleep         Complications: No apparent anesthesia complications

## 2017-10-31 ENCOUNTER — Encounter: Payer: Self-pay | Admitting: Gastroenterology

## 2017-10-31 LAB — CBC
HCT: 44.2 % (ref 35.0–47.0)
HEMOGLOBIN: 15.1 g/dL (ref 12.0–16.0)
MCH: 29.3 pg (ref 26.0–34.0)
MCHC: 34.1 g/dL (ref 32.0–36.0)
MCV: 85.9 fL (ref 80.0–100.0)
PLATELETS: 265 10*3/uL (ref 150–440)
RBC: 5.14 MIL/uL (ref 3.80–5.20)
RDW: 13.3 % (ref 11.5–14.5)
WBC: 13.6 10*3/uL — ABNORMAL HIGH (ref 3.6–11.0)

## 2017-10-31 LAB — GLUCOSE, CAPILLARY
GLUCOSE-CAPILLARY: 128 mg/dL — AB (ref 65–99)
GLUCOSE-CAPILLARY: 126 mg/dL — AB (ref 65–99)
GLUCOSE-CAPILLARY: 156 mg/dL — AB (ref 65–99)
Glucose-Capillary: 111 mg/dL — ABNORMAL HIGH (ref 65–99)
Glucose-Capillary: 92 mg/dL (ref 65–99)
Glucose-Capillary: 113 mg/dL — ABNORMAL HIGH (ref 65–99)

## 2017-10-31 LAB — BASIC METABOLIC PANEL
Anion gap: 10 (ref 5–15)
BUN: 15 mg/dL (ref 6–20)
CALCIUM: 9.5 mg/dL (ref 8.9–10.3)
CHLORIDE: 105 mmol/L (ref 101–111)
CO2: 26 mmol/L (ref 22–32)
Creatinine, Ser: 0.89 mg/dL (ref 0.44–1.00)
Glucose, Bld: 137 mg/dL — ABNORMAL HIGH (ref 65–99)
Potassium: 4 mmol/L (ref 3.5–5.1)
SODIUM: 141 mmol/L (ref 135–145)

## 2017-10-31 MED ORDER — JEVITY 1.2 CAL PO LIQD
480.0000 mL | Freq: Three times a day (TID) | ORAL | Status: DC
  Administered 2017-11-01: 237 mL
  Administered 2017-11-01: 12:00:00 480 mL

## 2017-10-31 MED ORDER — BISACODYL 10 MG RE SUPP: 10.0000 mg | Freq: Every day | RECTAL | Status: DC | PRN

## 2017-10-31 MED ORDER — PRAVASTATIN SODIUM 20 MG PO TABS
40.0000 mg | ORAL_TABLET | Freq: Every day | ORAL | Status: DC
  Administered 2017-10-31: 40 mg
  Filled 2017-10-31: qty 2

## 2017-10-31 MED ORDER — CLOPIDOGREL BISULFATE 75 MG PO TABS
75.0000 mg | ORAL_TABLET | Freq: Every day | ORAL | Status: DC
  Administered 2017-10-31 – 2017-11-01 (×2): 75 mg
  Filled 2017-10-31 (×2): qty 1

## 2017-10-31 MED ORDER — DOCUSATE SODIUM 50 MG/5ML PO LIQD
100.0000 mg | Freq: Two times a day (BID) | ORAL | Status: DC
  Administered 2017-10-31 – 2017-11-01 (×2): 100 mg
  Filled 2017-10-31 (×4): qty 10

## 2017-10-31 MED ORDER — FREE WATER
120.0000 mL | Freq: Three times a day (TID) | Status: DC
  Administered 2017-11-01 (×2): 120 mL

## 2017-10-31 MED ORDER — AMOXICILLIN-POT CLAVULANATE 400-57 MG/5ML PO SUSR
400.0000 mg | Freq: Two times a day (BID) | ORAL | Status: DC
  Administered 2017-10-31 – 2017-11-01 (×2): 400 mg
  Filled 2017-10-31 (×3): qty 5

## 2017-10-31 MED ORDER — ASPIRIN 81 MG PO CHEW
81.0000 mg | CHEWABLE_TABLET | Freq: Every day | ORAL | Status: DC
  Administered 2017-11-01: 08:00:00 81 mg
  Filled 2017-10-31: qty 1

## 2017-10-31 NOTE — Progress Notes (Addendum)
OT Cancellation Note  Patient Details Name: Bethany Lara MRN: [id number] DOB: 18-Dec-1950   Cancelled Treatment:    Reason Eval/Treat Not Completed: Other (comment). Chart reviewed. Pt noted with PEG tube placement on 2/6 under general anesthesia. Per therapy protocol, require continue upon transfer or new OT orders to see. Spoke with RN, new OT orders received. Upon attempt, pt with SLP for assessment. On 2nd attempt, pt with dietician for tube feeding and education with dietician. Will re-attempt next date as pt is available for OT re-evaluation.   Jeni Salles, MPH, MS, OTR/L ascom [phone] 10/31/17, 3:01 PM

## 2017-10-31 NOTE — Progress Notes (Signed)
  Speech Language Pathology Treatment: Dysphagia  Patient Details Name: Bethany Lara MRN: [id number] DOB: [birth year] Today's Date: 10/31/2017 Time: 6387-5643 SLP Time Calculation (min) (ACUTE ONLY): 45 min  Assessment / Plan / Recommendation Clinical Impression  Pt seen for dysphagia education; pharyngeal swallowing exercises; education on need for frequent oral care; aspiration precautions.  HPI HPI: Pt is a 67 y.o. female has a past medical history significant for HLD and allergies now with acute onset right facial weakness with vertigo and difficulty with speech and swallowing. 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 Pt presents w/ R labial-facial weakness w/ min Dysarthria(speech overall intelligible). Pt is alert w/ no apparent Aphasia or Cognitive deficits. Pt does present w/ need to cough and spit her saliva or suction it using a yaunker versus swallowing it. Pt indicated she felt she "choked" on her saliva last evening while watching tv - this has been ongoing for ~48+ hours per her report. MBSS revealed severe pharyngeal phase dysphagia w/ recommendation for NPO status. PEG placement for nutrition/hydration support performed yesterday, 10/30/17. Pt is awake, alert and waiting for TFs to begin.       SLP Plan  Continue with current plan of care       Recommendations  Diet recommendations: NPO(TFs via PEG) Liquids provided via: (n/a) Medication Administration: Via alternative means                General recommendations: (Dietician f/u) Oral Care Recommendations: Oral care QID;Patient independent with oral care Follow up Recommendations: Home health SLP SLP Visit Diagnosis: Dysphagia, pharyngeal phase (R13.13);Dysphagia, oropharyngeal phase (R13.12) Plan: Continue with current plan of care       [place], [place], CCC-SLP Kushal Saunders 10/31/2017, 3:20 PM

## 2017-10-31 NOTE — Progress Notes (Signed)
Patient ID: Bethany Lara, female   DOB: 1950/11/21, 67 y.o.   MRN: [id number]  [employer] PROGRESS NOTE  Bethany Lara BZJ:[id number] DOB: 1951/07/27 DOA: 10/26/2017 PCP: Kathrine Haddock, NP  HPI/Subjective: Patient's vision is better.  Not needing a suctioning as much but still needs it.  Her speech is better.  Still has facial droop.  Objective: Vitals:   10/31/17 0457 10/31/17 1141  BP: (!) 144/88 137/61  Pulse: 61 60  Resp: 18   Temp: 97.8 F (36.6 C) (!) 97.5 F (36.4 C)  SpO2: 95% 97%   No intake or output data in the 24 hours ending 10/31/17 1555 Filed Weights   10/29/17 0556 10/30/17 0359 10/30/17 1340  Weight: 80.6 kg (177 lb 12.8 oz) 80.1 kg (176 lb 9.6 oz) 79.8 kg (176 lb)    ROS: Review of Systems  Constitutional: Negative for chills and fever.  Eyes: Negative for blurred vision.  Respiratory: Negative for cough and shortness of breath.   Cardiovascular: Negative for chest pain.  Gastrointestinal: Negative for abdominal pain, constipation, diarrhea, nausea and vomiting.  Genitourinary: Negative for dysuria.  Musculoskeletal: Negative for joint pain.  Neurological: Negative for dizziness and headaches.   Exam: Physical Exam  HENT:  Nose: No mucosal edema.  Mouth/Throat: No oropharyngeal exudate or posterior oropharyngeal edema.  Eyes: Conjunctivae, EOM and lids are normal. Pupils are equal, round, and reactive to light.  Neck: No JVD present. Carotid bruit is not present. No edema present. No thyroid mass and no thyromegaly present.  Cardiovascular: S1 normal and S2 normal. Exam reveals no gallop.  No murmur heard. Pulses:      Dorsalis pedis pulses are 2+ on the right side, and 2+ on the left side.  Respiratory: No respiratory distress. She has decreased breath sounds in the right lower field and the left lower field. She has no wheezes. She has no rhonchi. She has no rales.  GI: Soft. Bowel sounds are normal. There is no tenderness.   Musculoskeletal:       Right ankle: She exhibits no swelling.       Left ankle: She exhibits no swelling.  Lymphadenopathy:    She has no cervical adenopathy.  Neurological: She is alert. No cranial nerve deficit.  Skin: Skin is warm. No rash noted. Nails show no clubbing.  Psychiatric: She has a normal mood and affect.      Data Reviewed: Basic Metabolic Panel: Recent Labs  Lab 10/26/17 1316 10/27/17 0631 10/28/17 0433 10/31/17 0331  NA 140 140 140 141  K 4.1 3.8 3.6 4.0  CL 107 109 107 105  CO2 26 21* 25 26  GLUCOSE 120* 118* 216* 137*  BUN 15 14 18 15   CREATININE 0.87 0.85 0.85 0.89  CALCIUM 9.5 9.2 9.2 9.5  MG  --   --  2.4  --   PHOS  --   --  3.1  --    Liver Function Tests: Recent Labs  Lab 10/26/17 1316 10/27/17 0631  AST 26 23  ALT 25 23  ALKPHOS 61 55  BILITOT 0.8 0.6  PROT 7.5 7.0  ALBUMIN 4.6 4.1   CBC: Recent Labs  Lab 10/26/17 1224 10/27/17 0631 10/28/17 0433 10/31/17 0331  WBC 7.1 10.2 12.4* 13.6*  NEUTROABS 5.1  --   --   --   HGB 15.4 14.5 15.2 15.1  HCT 45.7 42.4 45.2 44.2  MCV 87.5 87.5 86.9 85.9  PLT 283 301 288 265   Cardiac Enzymes:  Recent Labs  Lab 10/26/17 1224  TROPONINI <0.03    CBG: Recent Labs  Lab 10/30/17 2100 10/31/17 0105 10/31/17 0459 10/31/17 0737 10/31/17 1143  GLUCAP 131* 128* 126* 111* 92    Recent Results (from the past 240 hour(s))  MRSA PCR Screening     Status: None   Collection Time: 10/27/17  9:45 PM  Result Value Ref Range Status   MRSA by PCR NEGATIVE NEGATIVE Final    Comment:        The GeneXpert MRSA Assay (FDA approved for NASAL specimens only), is one component of a comprehensive MRSA colonization surveillance program. It is not intended to diagnose MRSA infection nor to guide or monitor treatment for MRSA infections. Performed at [facility], [address] [place], [place] [address]       Scheduled Meds: . amoxicillin-clavulanate  400 mg Per Tube Q12H  .  [START ON 11/01/2017] aspirin  81 mg Per Tube Daily  . clopidogrel  75 mg Per Tube Daily  . docusate  100 mg Per Tube BID  . enoxaparin (LOVENOX) injection  40 mg Subcutaneous Q24H  . feeding supplement (JEVITY 1.2 CAL)  237 mL Per Tube BID  . fluticasone  2 spray Each Nare BID  . pantoprazole (PROTONIX) IV  40 mg Intravenous Q12H  . pravastatin  40 mg Per Tube q1800   Continuous Infusions: . dextrose 25 mL/hr at 10/30/17 1700    Assessment/Plan:  1. 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  Right facial droop, trouble swallowing.  Speech has improved and blurred vision has improved.  Switch aspirin to PEG tube.  Add Plavix and pravastatin (patient has an allergy to simvastatin).  Physical therapy recommends home health.  PEG tube place with her trouble swallowing and failed swallow evaluation.  Will also need speech therapy as outpatient 2. Bradycardia with stroke.  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 Hyperlipidemia unspecified.  Restart pravastatin PEG 4. Otitis media restart antibiotics Augmentin and finish up course.  Code Status:     Code Status Orders  (From admission, onward)        Start     Ordered   10/27/17 2225  Full code  Continuous     10/27/17 2225    Code Status History    Date Active Date Inactive Code Status Order ID Comments User Context   10/26/2017 18:07 10/27/2017 22:25 Full Code [id number]  Idelle Crouch, MD Inpatient   05/20/2017 16:49 05/21/2017 16:26 Full Code [id number]  Nicholes Mango, MD Inpatient    Advance Directive Documentation     Most Recent Value  Type of Advance Directive  Healthcare Power of Attorney, Living will  Pre-existing out of facility DNR order (yellow form or pink MOST form)  No data  "MOST" Form in Place?  No data     Family Communication: Permission to speak in front of visitors at the bedside Disposition Plan: Home tomorrow or Saturday  Consultants:  Neurology  Cardiology  Antibiotics:  Augmentin  Time spent:  25 minutes  [place]

## 2017-10-31 NOTE — Progress Notes (Signed)
Bethany Darby, MD [address]  [place]  [place], [place] [address]  Main: [phone]  Fax: [phone] Pager: [phone]   Subjective: Status post PEG yesterday. Patient started on tube feeds today and tolerating well. She denies any bleeding, pain, fever. She is working with speech therapy and she tried to swallow iced chips today which went well.   Objective: Vital signs in last 24 hours: Vitals:   10/31/17 0103 10/31/17 0457 10/31/17 1141 10/31/17 1632  BP: 132/68 (!) 144/88 137/61 111/64  Pulse: 60 61 60 96  Resp: 18 18  18   Temp: 97.9 F (36.6 C) 97.8 F (36.6 C) (!) 97.5 F (36.4 C) 98.1 F (36.7 C)  TempSrc: Oral Oral Oral Oral  SpO2: 95% 95% 97% 94%  Weight:      Height:       Weight change: -9.6 oz (-0.272 kg) No intake or output data in the 24 hours ending 10/31/17 1853   Exam: Heart:: Regular rate and rhythm or S1S2 present Lungs: clear to auscultation Abdomen: soft, nontender, normal bowel sounds, PEG site looks healthy, with no bleeding   Lab Results: CBC Latest Ref Rng & Units 10/31/2017 10/28/2017 10/27/2017  WBC 3.6 - 11.0 K/uL 13.6(H) 12.4(H) 10.2  Hemoglobin 12.0 - 16.0 g/dL 15.1 15.2 14.5  Hematocrit 35.0 - 47.0 % 44.2 45.2 42.4  Platelets 150 - 440 K/uL 265 288 301   Micro Results: Recent Results (from the past 240 hour(s))  MRSA PCR Screening     Status: None   Collection Time: 10/27/17  9:45 PM  Result Value Ref Range Status   MRSA by PCR NEGATIVE NEGATIVE Final    Comment:        The GeneXpert MRSA Assay (FDA approved for NASAL specimens only), is one component of a comprehensive MRSA colonization surveillance program. It is not intended to diagnose MRSA infection nor to guide or monitor treatment for MRSA infections. Performed at [facility], [address] [place], [place] [address]    Studies/Results: No results found. Medications: I have reviewed the patient's current medications. Scheduled  Meds: . amoxicillin-clavulanate  400 mg Per Tube Q12H  . [START ON 11/01/2017] aspirin  81 mg Per Tube Daily  . clopidogrel  75 mg Per Tube Daily  . docusate  100 mg Per Tube BID  . enoxaparin (LOVENOX) injection  40 mg Subcutaneous Q24H  . feeding supplement (JEVITY 1.2 CAL)  237 mL Per Tube BID  . [START ON 11/01/2017] feeding supplement (JEVITY 1.2 CAL)  480 mL Per Tube TID WC  . fluticasone  2 spray Each Nare BID  . [START ON 11/01/2017] free water  120 mL Per Tube TID WC  . pantoprazole (PROTONIX) IV  40 mg Intravenous Q12H  . pravastatin  40 mg Per Tube q1800   Continuous Infusions: . dextrose 25 mL/hr at 10/30/17 1700   PRN Meds:.acetaminophen **OR** acetaminophen, bisacodyl, bisacodyl, HYDROcodone-acetaminophen, ondansetron **OR** ondansetron (ZOFRAN) IV, traZODone   Assessment: Principal Problem:   CVA (cerebrovascular accident) ([place]) Active Problems:   Hyperlipidemia   Bradycardia   Swallowing dysfunction   CVA (cerebral vascular accident) ([place])   Catatonia   Acute respiratory distress  Status post PEG on 10/30/2017  Plan: -Tube feeds per nutrition - Water via PEG as well as needed - Routine PEG care, instructions provided to patient   GI will sign off, please call us back with questions   LOS: 5 days   Olson Lucarelli 10/31/2017, 6:53 PM

## 2017-10-31 NOTE — Progress Notes (Signed)
Nutrition Follow-up  DOCUMENTATION CODES:   Obesity unspecified  INTERVENTION:  Patient tolerated her first bolus tube feeding. She is scheduled to have another at [place], but can also provide later if requested by patient.  On 2/8 advance to goal regimen of Jevity 1.2 Cal 2 cans TID (total of 6 cans daily) via PEG tube. Provides 1710 kcal, 79 grams of protein, 1146 mL H2O, 25.8 grams fiber daily.  Recommend free water flush of 60 mL before and after each bolus feeding. Provides total of 1506 mL water daily including water in tube feeding. Will monitor hydration status and adjust as needed.  Goal TF regimen meets 100% RDIs for vitamins/minerals.  Reviewed tube feed regimen with patient. Provided "Tube Feeding at Home" book from [place] with patient and discussed tube feeding process.  NUTRITION DIAGNOSIS:   Inadequate oral intake related to inability to eat, dysphagia, acute illness(acute right lateral medullary CVA) as evidenced by NPO status.  Ongoing.  GOAL:   Patient will meet greater than or equal to 90% of their needs  Progressing with advancement to goal tube feed regimen.  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  Met with patient at bedside. She had just finished her pharyngeal swallowing exercises with SLP. During time with RD patient received her first bolus tube feeding and tolerated well. Patient still has not had a bowel movement this admission, but hopefully with initiation of tube feeds patient will be able to have bowel movement.  Access: externally removable 22 Fr. PEG tube with C-clamp placed 2/6  Medications reviewed and  include: Augmentin, Colace, pantoprazole, D5W at 25 mL/hr (30 grams dextrose, 102 kcal daily).  Labs reviewed: CBG 92-131 past 24 hrs.  I/O: pt had 2 occurrences UOP yesterday and 1 today  Discussed with RN.  Diet Order:  Aspiration precautions Diet NPO time specified  EDUCATION NEEDS:   Education needs have been addressed  Skin:  Skin Assessment: Reviewed RN Assessment  Last BM:  PTA (10/25/2017)  Height:   Ht Readings from Last 1 Encounters:  10/30/17 _0  (1.549 m)    Weight:   Wt Readings from Last 1 Encounters:  10/30/17 176 lb (79.8 kg)    Ideal Body Weight:  47.7 kg  BMI:  Body mass index is 33.25 kg/m.  Estimated Nutritional Needs:   Kcal:  1650-1905 (MSJ x 1.3-1.5)  Protein:  80-95 grams (1-1.2 grams/kg)  Fluid:  1.4-1.7 L/day (30-35 mL/kg IBW)  Willey Blade, MS, RD, LDN Office: [phone] Pager: [phone] After Hours/Weekend Pager: [phone]

## 2017-10-31 NOTE — Progress Notes (Signed)
°   10/31/17 1310  Clinical Encounter Type  Visited With Patient and family together  Visit Type Follow-up  Referral From Nurse  Consult/Referral To Chaplain  Spiritual Encounters  Spiritual Needs Prayer;Emotional   Bethany Lara visited with PT and her husband. PT was in good spirits. CH gave encouragement and will follow up.

## 2017-10-31 NOTE — Progress Notes (Signed)
Physical Therapy Treatment Patient Details Name: Bethany Lara MRN: [id number] DOB: [birth year] Today's Date: 10/31/2017    History of Present Illness Pt is a 67 year old female who was admitted to [facility] on 10/26/17.  Pt woke up feeling fine, but then developed intense vertigo (medication did not help); pt developed R sided facial droop and dysarthria.  Also presenting with dysphagia, double vision, and dizziness.  Pt reports that she had a similar episode in December and her MRI was fine. 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  Pt transferred to ICU PM of 10/27/17 with sudden onset respiratory distress; pt also transferred out of ICU 10/28/17.  Pt failed swallow test 10/28/17.    PT Comments    Pt finishing bathing with nursing upon arrival.  She was able to stand at sink to brush her hair without LOB.  She continued to ambulate around unit x 1 with walker and generally slow but steady gait.  She reports she did not use a walker prior to admit but feels she needs it for safety at this point.  Participated in exercises as described below.   Follow Up Recommendations  Home health PT;Supervision for mobility/OOB     Equipment Recommendations  Rolling walker with 5" wheels    Recommendations for Other Services       Precautions / Restrictions Precautions Precautions: Fall Precaution Comments: NPO Restrictions Weight Bearing Restrictions: No    Mobility  Bed Mobility Overal bed mobility: Modified Independent       Supine to sit: Modified independent (Device/Increase time);HOB elevated Sit to supine: Supervision      Transfers Overall transfer level: Modified independent Equipment used: Rolling walker (2 wheeled) Transfers: Sit to/from Stand Sit to Stand: Modified independent (Device/Increase time) Stand pivot transfers: Supervision          Ambulation/Gait Ambulation/Gait assistance: Min guard Ambulation Distance (Feet): 180 Feet Assistive device: Rolling  walker (2 wheeled) Gait Pattern/deviations: Step-through pattern Gait velocity: decreased Gait velocity interpretation: Below normal speed for age/gender     Stairs            Wheelchair Mobility    Modified Rankin (Stroke Patients Only)       Balance Overall balance assessment: Needs assistance Sitting-balance support: No upper extremity supported;Feet supported Sitting balance-Leahy Scale: Normal     Standing balance support: No upper extremity supported Standing balance-Leahy Scale: Good Standing balance comment: steady standing reaching within BOS (pt appearing more cautious with activity)                            Cognition Arousal/Alertness: Awake/alert Behavior During Therapy: WFL for tasks assessed/performed Overall Cognitive Status: Within Functional Limits for tasks assessed                                        Exercises Other Exercises Other Exercises: standing exercises with walker for balance for heel raises, marches, SLR, Ab/add x 10    General Comments        Pertinent Vitals/Pain Pain Assessment: No/denies pain    Home Living                      Prior Function            PT Goals (current goals can now be found in the care plan section)  Progress towards PT goals: Progressing toward goals    Frequency    7X/week      PT Plan Current plan remains appropriate    Co-evaluation              AM-PAC PT "6 Clicks" Daily Activity  Outcome Measure  Difficulty turning over in bed (including adjusting bedclothes, sheets and blankets)?: None Difficulty moving from lying on back to sitting on the side of the bed? : None Difficulty sitting down on and standing up from a chair with arms (e.g., wheelchair, bedside commode, etc,.)?: None Help needed moving to and from a bed to chair (including a wheelchair)?: None Help needed walking in hospital room?: A Little Help needed climbing 3-5 steps  with a railing? : A Little 6 Click Score: 22    End of Session Equipment Utilized During Treatment: Gait belt Activity Tolerance: Patient tolerated treatment well Patient left: in bed;with bed alarm set;with call bell/phone within reach         Time: 6244-6950 PT Time Calculation (min) (ACUTE ONLY): 23 min  Charges:  $Gait Training: 8-22 mins $Therapeutic Exercise: 8-22 mins                    G Codes:       Chesley Noon, PTA 10/31/17, 10:04 AM

## 2017-11-01 LAB — GLUCOSE, CAPILLARY
GLUCOSE-CAPILLARY: 109 mg/dL — AB (ref 65–99)
Glucose-Capillary: 117 mg/dL — ABNORMAL HIGH (ref 65–99)
Glucose-Capillary: 125 mg/dL — ABNORMAL HIGH (ref 65–99)
Glucose-Capillary: 138 mg/dL — ABNORMAL HIGH (ref 65–99)

## 2017-11-01 MED ORDER — VITAMIN D (CHOLECALCIFEROL) 25 MCG (1000 UT) PO TABS: 2000.0000 [IU] | ORAL_TABLET | Freq: Every day | ORAL | Status: DC

## 2017-11-01 MED ORDER — CLOPIDOGREL BISULFATE 75 MG PO TABS: 75.0000 mg | ORAL_TABLET | Freq: Every day | ORAL | 0 refills | Status: DC

## 2017-11-01 MED ORDER — DOCUSATE SODIUM 50 MG/5ML PO LIQD: 100.0000 mg | Freq: Two times a day (BID) | ORAL | 0 refills | Status: DC | PRN

## 2017-11-01 MED ORDER — BISACODYL 10 MG RE SUPP
10.0000 mg | Freq: Every day | RECTAL | Status: DC | PRN
  Filled 2017-11-01: qty 1

## 2017-11-01 MED ORDER — GI COCKTAIL ~~LOC~~
30.0000 mL | Freq: Once | ORAL | Status: AC
  Administered 2017-11-01: 12:00:00 30 mL
  Filled 2017-11-01: qty 30

## 2017-11-01 MED ORDER — CETIRIZINE HCL 10 MG PO TABS: 10.0000 mg | ORAL_TABLET | Freq: Every day | ORAL | 11 refills | Status: DC

## 2017-11-01 MED ORDER — FAMOTIDINE 40 MG/5ML PO SUSR
20.0000 mg | Freq: Two times a day (BID) | ORAL | Status: DC
  Administered 2017-11-01: 20 mg
  Filled 2017-11-01 (×2): qty 2.5

## 2017-11-01 MED ORDER — PANTOPRAZOLE SODIUM 40 MG PO PACK: 40.0000 mg | PACK | Freq: Two times a day (BID) | ORAL | Status: DC

## 2017-11-01 MED ORDER — FREE WATER: 120.0000 mL | Freq: Three times a day (TID) | 0 refills | Status: DC

## 2017-11-01 MED ORDER — PRAVASTATIN SODIUM 40 MG PO TABS: 40.0000 mg | ORAL_TABLET | Freq: Every day | ORAL | 3 refills | Status: DC

## 2017-11-01 MED ORDER — ASPIRIN 81 MG PO CHEW: 81.0000 mg | CHEWABLE_TABLET | Freq: Every day | ORAL | 0 refills | Status: DC

## 2017-11-01 MED ORDER — JEVITY 1.2 CAL PO LIQD: 480.0000 mL | Freq: Three times a day (TID) | ORAL | 0 refills | Status: DC

## 2017-11-01 MED ORDER — ONE-DAILY MULTI VITAMINS PO TABS: 1.0000 | ORAL_TABLET | Freq: Every day | ORAL | Status: DC

## 2017-11-01 MED ORDER — AMOXICILLIN-POT CLAVULANATE 400-57 MG/5ML PO SUSR: 400.0000 mg | Freq: Two times a day (BID) | ORAL | 0 refills | Status: DC

## 2017-11-01 NOTE — Progress Notes (Signed)
OT Cancellation Note  Patient Details Name: Bethany Lara MRN: [id number] DOB: 10/07/1950   Cancelled Treatment:    Reason Eval/Treat Not Completed: Other (comment). RN preparing pt for discharge. Not available for an OT re-evaluation at this time. Home health services set up upon discharge. Will re-attempt as appropriate next date should pt end up not discharging this date.  Jeni Salles, MPH, MS, OTR/L ascom [phone] 11/01/17, 2:26 PM

## 2017-11-01 NOTE — Discharge Summary (Signed)
Bethany Lara NAME: Bethany Lara    MR#:  [id number]  DATE OF BIRTH:  1950-11-20  DATE OF ADMISSION:  10/26/2017 ADMITTING PHYSICIAN: Idelle Crouch, MD  DATE OF DISCHARGE: 11/01/2017  3:20 PM  PRIMARY CARE PHYSICIAN: Kathrine Haddock, NP    ADMISSION DIAGNOSIS:  CVA (cerebral vascular accident) ([place]) [I63.9] Cerebrovascular accident (CVA), unspecified mechanism ([place]) [I63.9]  DISCHARGE DIAGNOSIS:  Acute CVA  SECONDARY DIAGNOSIS:   Past Medical History:  Diagnosis Date  . Allergy   . Carpal tunnel syndrome   . Hyperlipidemia   . Stroke [facility])     HOSPITAL COURSE:   1.  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  The patient had a right facial droop, trouble swallowing, blurred vision.  Her blurred vision has improved.  She still has the right facial droop.  She failed the swallow evaluation with speech therapy.  The patient had a PEG tube placed.  She was given rectal aspirin until the PEG tube was placed and that was switched over to chewable aspirin 81 mg daily.  Plavix was added.  Pravastatin restarted.  Pravastatin use because the patient has an allergy to simvastatin and I did not want to try atorvastatin at this time.  Home health PT RN and speech therapy ordered.  Patient was started on PEG tube feeds.  She has not gotten up to goal yet but she still wanted to go home.  Dietary was consulted about PEG tube feeding and hydration with the PEG tube. 2.  Bradycardia with stroke.  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  Heart rate did drop down into the 30s and 40s especially at night while sleeping. 3.  Hyperlipidemia unspecified.  Restarted pravastatin with PEG tube.  LDL 99 and goal less than 70 4.  Impaired fasting glucose.  Patient is not a diabetic.  Hemoglobin A1c 5.5. 5.  Recommend an outpatient sleep study to rule out sleep apnea 6.  Otitis media Augmentin prescribed  DISCHARGE CONDITIONS:   Satisfactory  CONSULTS  OBTAINED:  Treatment Team:  Melvenia Beam, MD Isaias Cowman, MD Alexis Goodell, MD Lin Landsman, MD  DRUG ALLERGIES:   Allergies  Allergen Reactions  . Zocor [Simvastatin] Other (See Comments)    Fatigue, legs achey     DISCHARGE MEDICATIONS:   Allergies as of 11/01/2017      Reactions   Zocor [simvastatin] Other (See Comments)   Fatigue, legs achey      Medication List    STOP taking these medications   amoxicillin-clavulanate 875-125 MG tablet Commonly known as:  AUGMENTIN Replaced by:  amoxicillin-clavulanate 400-57 MG/5ML suspension   aspirin 81 MG tablet Replaced by:  aspirin 81 MG chewable tablet   fenofibrate 160 MG tablet   hydrOXYzine 50 MG capsule Commonly known as:  VISTARIL   meclizine 25 MG tablet Commonly known as:  ANTIVERT     TAKE these medications   amoxicillin-clavulanate 400-57 MG/5ML suspension Commonly known as:  AUGMENTIN Place 5 mLs (400 mg total) into feeding tube every 12 (twelve) hours. Replaces:  amoxicillin-clavulanate 875-125 MG tablet   aspirin 81 MG chewable tablet Place 1 tablet (81 mg total) into feeding tube daily. Start taking on:  11/02/2017 Replaces:  aspirin 81 MG tablet   cetirizine 10 MG tablet Commonly known as:  ZYRTEC Place 1 tablet (10 mg total) into feeding tube daily. What changed:  how to take this   clopidogrel 75 MG tablet Commonly known  as:  PLAVIX Place 1 tablet (75 mg total) into feeding tube daily. Start taking on:  11/02/2017   docusate 50 MG/5ML liquid Commonly known as:  COLACE Place 10 mLs (100 mg total) into feeding tube 2 (two) times daily as needed for mild constipation.   feeding supplement (JEVITY 1.2 CAL) Liqd Place 480 mLs into feeding tube 3 (three) times daily with meals.   fluticasone 50 MCG/ACT nasal spray Commonly known as:  FLONASE Place 2 sprays into both nostrils 2 (two) times daily.   free water Soln Place 120 mLs into feeding tube 3 (three) times daily  with meals.   multivitamin tablet Place 1 tablet into feeding tube daily. What changed:  how to take this   pravastatin 40 MG tablet Commonly known as:  PRAVACHOL Place 1 tablet (40 mg total) into feeding tube at bedtime. What changed:  how to take this   Vitamin D (Cholecalciferol) 1000 units Tabs Give 2,000 Units by tube daily. What changed:    medication strength  how to take this            Durable Medical Equipment  (From admission, onward)        Start     Ordered   11/01/17 1259  For home use only DME Suction  Once    Question:  Suction  Answer:  Oral   11/01/17 1258       DISCHARGE INSTRUCTIONS:   With follow-up PMD 1 week Home health set up  If you experience worsening of your admission symptoms, develop shortness of breath, life threatening emergency, suicidal or homicidal thoughts you must seek medical attention immediately by calling 911 or calling your MD immediately  if symptoms less severe.  You Must read complete instructions/literature along with all the possible adverse reactions/side effects for all the Medicines you take and that have been prescribed to you. Take any new Medicines after you have completely understood and accept all the possible adverse reactions/side effects.   Please note  You were cared for by a hospitalist during your hospital stay. If you have any questions about your discharge medications or the care you received while you were in the hospital after you are discharged, you can call the unit and asked to speak with the hospitalist on call if the hospitalist that took care of you is not available. Once you are discharged, your primary care physician will handle any further medical issues. Please note that NO REFILLS for any discharge medications will be authorized once you are discharged, as it is imperative that you return to your primary care physician (or establish a relationship with a primary care physician if you do not  have one) for your aftercare needs so that they can reassess your need for medications and monitor your lab values.    Today   CHIEF COMPLAINT:   Chief Complaint  Patient presents with  . Facial Droop    HISTORY OF PRESENT ILLNESS:  Bethany Lara  is a 67 y.o. female who presented with facial droop   VITAL SIGNS:  Blood pressure 121/71, pulse 90, temperature 98.6 F (37 C), temperature source Oral, resp. rate 20, height 5\' 1"  (1.549 m), weight 77.7 kg (171 lb 6.4 oz), SpO2 96 %.    PHYSICAL EXAMINATION:  GENERAL:  67 y.o.-year-old patient lying in the bed with no acute distress.  EYES: Pupils equal, round, reactive to light and accommodation. No scleral icterus. Extraocular muscles intact.  HEENT: Head atraumatic, normocephalic. Oropharynx and  nasopharynx clear.  NECK:  Supple, no jugular venous distention. No thyroid enlargement, no tenderness.  LUNGS: Normal breath sounds bilaterally, no wheezing, rales,rhonchi or crepitation. No use of accessory muscles of respiration.  CARDIOVASCULAR: S1, S2 normal. No murmurs, rubs, or gallops.  ABDOMEN: Soft, non-tender, non-distended. Bowel sounds present. No organomegaly or mass.  EXTREMITIES: No pedal edema, cyanosis, or clubbing.  NEUROLOGIC: Right facial droop. Muscle strength 5/5 in all extremities. Sensation intact. Gait not checked.  PSYCHIATRIC: The patient is alert and oriented x 3.  SKIN: No obvious rash, lesion, or ulcer.   DATA REVIEW:   CBC Recent Labs  Lab 10/31/17 0331  WBC 13.6*  HGB 15.1  HCT 44.2  PLT 265    Chemistries  Recent Labs  Lab 10/27/17 0631 10/28/17 0433 10/31/17 0331  NA 140 140 141  K 3.8 3.6 4.0  CL 109 107 105  CO2 21* 25 26  GLUCOSE 118* 216* 137*  BUN 14 18 15   CREATININE 0.85 0.85 0.89  CALCIUM 9.2 9.2 9.5  MG  --  2.4  --   AST 23  --   --   ALT 23  --   --   ALKPHOS 55  --   --   BILITOT 0.6  --   --     Cardiac Enzymes Recent Labs  Lab 10/26/17 1224  [place]  <0.03    Microbiology Results  Results for orders placed or performed during the hospital encounter of 10/26/17  MRSA PCR Screening     Status: None   Collection Time: 10/27/17  9:45 PM  Result Value Ref Range Status   MRSA by PCR NEGATIVE NEGATIVE Final    Comment:        The GeneXpert MRSA Assay (FDA approved for NASAL specimens only), is one component of a comprehensive MRSA colonization surveillance program. It is not intended to diagnose MRSA infection nor to guide or monitor treatment for MRSA infections. Performed at [facility], [address], [place], [place] [address]       Management plans discussed with the patient, family and they are in agreement.  CODE STATUS:     Code Status Orders  (From admission, onward)        Start     Ordered   10/27/17 2225  Full code  Continuous     10/27/17 2225    Code Status History    Date Active Date Inactive Code Status Order ID Comments User Context   10/26/2017 18:07 10/27/2017 22:25 Full Code [id number]  Idelle Crouch, MD Inpatient   05/20/2017 16:49 05/21/2017 16:26 Full Code [id number]  Nicholes Mango, MD Inpatient    Advance Directive Documentation     Most Recent Value  Type of Advance Directive  Healthcare Power of Attorney, Living will  Pre-existing out of facility DNR order (yellow form or pink MOST form)  No data  "MOST" Form in Place?  No data      TOTAL TIME TAKING CARE OF THIS PATIENT: 35 minutes.    Loletha Grayer M.D on 11/01/2017 at 5:17 PM  Between 7am to 6pm - Pager - [phone]  After 6pm go to [email or web address] - password [email or web address]  [employer] Office  [phone]  CC: Primary care physician; Kathrine Haddock, NP

## 2017-11-01 NOTE — Care Management (Signed)
Discharge to home today per Dr. Leslye Peer. [facility] will be providing home health services, Jevity, and suction machine. Floydene Flock, [facility] representative updated. Family will transport Shelbie Ammons RN MSN CCM Care Management [phone]

## 2017-11-01 NOTE — Plan of Care (Signed)
Pt is d/ced home - she is tolerating tube feeds but hasn't started goal yet - only taking one container at breakfast and lunch.  Dr. Leslye Peer spoke to the dietician and she was ok with her going home.  Providing patient with feeds through the weekend.  She worked with PT using a walker.  IOnly deficit from stroke continues to be Rsided facial droop.  Vs removed.  Patient will go home with her husband.

## 2017-11-01 NOTE — Progress Notes (Signed)
Physical Therapy Treatment Patient Details Name: Bethany Lara MRN: [id number] DOB: [birth year] Today's Date: 11/01/2017    History of Present Illness Pt is a 67 year old female who was admitted to [facility] on 10/26/17.  Pt woke up feeling fine, but then developed intense vertigo (medication did not help); pt developed R sided facial droop and dysarthria.  Also presenting with dysphagia, double vision, and dizziness.  Pt reports that she had a similar episode in December and her MRI was fine. 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  Pt transferred to ICU PM of 10/27/17 with sudden onset respiratory distress; pt also transferred out of ICU 10/28/17.  Pt failed swallow test 10/28/17.  S/p PEG placement 10/30/17 (new PT orders received 10/31/17).      PT Comments    Pt reporting no dizziness today.  Able to navigate a couple steps with UE support (CGA) and also ambulate around nursing loop with RW (CGA to SBA).  Tolerated standing ex's (using UE support for balance) with intermittent sitting rest breaks.  No loss of balance during session's activities.  Will continue to focus on strengthening, balance, and progressing functional mobility (increasing independence and transitioning to least restrictive assistive device).      Follow Up Recommendations  Home health PT     Equipment Recommendations  Rolling walker with 5" wheels    Recommendations for Other Services OT consult     Precautions / Restrictions Precautions Precautions: Fall Precaution Comments: NPO; PEG tube; HOB >30 degrees Restrictions Weight Bearing Restrictions: No    Mobility  Bed Mobility Overal bed mobility: Modified Independent       Supine to sit: Modified independent (Device/Increase time) Sit to supine: Modified independent (Device/Increase time)   General bed mobility comments: No difficulties noted with bed mobility.  Transfers Overall transfer level: Modified independent Equipment used: Rolling walker (2  wheeled) Transfers: Sit to/from [facility] Sit to Stand: Modified independent (Device/Increase time) Stand pivot transfers: Modified independent (Device/Increase time)       General transfer comment: Initial vc's required for hand placement with transfers standing but then no further cueing required during session; steady  Ambulation/Gait Ambulation/Gait assistance: Min guard;Supervision Ambulation Distance (Feet): (60 feet; 200 feet) Assistive device: Rolling walker (2 wheeled) Gait Pattern/deviations: Step-through pattern Gait velocity: decreased   General Gait Details: steady gait with RW   Stairs Stairs: Yes   Stair Management: One rail Right;Step to pattern;Forwards;With walker Number of Stairs: 2 General stair comments: ascended/descended 1 step with railing and 1 step with RW; initial vc's for technique required  Wheelchair Mobility    Modified Rankin (Stroke Patients Only)       Balance Overall balance assessment: Needs assistance Sitting-balance support: Feet supported;No upper extremity supported Sitting balance-Leahy Scale: Normal Sitting balance - Comments: steady sitting reaching outside BOS   Standing balance support: No upper extremity supported Standing balance-Leahy Scale: Good Standing balance comment: steady standing reaching within BOS (pt appearing somewhat cautious but still demonstrating more confidence with activity than prior sessions)                            Cognition Arousal/Alertness: Awake/alert Behavior During Therapy: WFL for tasks assessed/performed Overall Cognitive Status: Within Functional Limits for tasks assessed  Exercises Total Joint Exercises Knee Flexion: AROM;Strengthening;Both;10 reps;Standing;Other (comment)(hamstring curls; single UE support on bed rail for balance) General Exercises - Lower Extremity Hip ABduction/ADduction:  AROM;Strengthening;Both;10 reps;Standing;Other (comment)(single UE support on bed rail for balance) Hip Flexion/Marching: AROM;Strengthening;Both;10 reps;Standing;Other (comment)(B UE support on RW) Heel Raises: AROM;Strengthening;Both;10 reps;Standing;Other (comment)(B UE support on RW)    General Comments General comments (skin integrity, edema, etc.): Pt resting in bed upon PT entry.  Nursing cleared pt for participation in physical therapy.  Pt agreeable to PT session.      Pertinent Vitals/Pain Pain Assessment: No/denies pain  Vitals (HR and O2 on room air) stable and WFL throughout treatment session.     Home Living                      Prior Function            PT Goals (current goals can now be found in the care plan section) Acute Rehab PT Goals Patient Stated Goal: to improve mobility and go home PT Goal Formulation: With patient Time For Goal Achievement: 11/12/17 Potential to Achieve Goals: Good Progress towards PT goals: Progressing toward goals    Frequency    7X/week      PT Plan Current plan remains appropriate    Co-evaluation              AM-PAC PT "6 Clicks" Daily Activity  Outcome Measure  Difficulty turning over in bed (including adjusting bedclothes, sheets and blankets)?: None Difficulty moving from lying on back to sitting on the side of the bed? : None Difficulty sitting down on and standing up from a chair with arms (e.g., wheelchair, bedside commode, etc,.)?: None Help needed moving to and from a bed to chair (including a wheelchair)?: None Help needed walking in hospital room?: A Little Help needed climbing 3-5 steps with a railing? : A Little 6 Click Score: 22    End of Session Equipment Utilized During Treatment: Gait belt Activity Tolerance: Patient tolerated treatment well Patient left: in bed;with call bell/phone within reach;with bed alarm set Nurse Communication: Mobility status;Precautions PT Visit Diagnosis:  Other abnormalities of gait and mobility (R26.89);Difficulty in walking, not elsewhere classified (R26.2);Dizziness and giddiness (R42)     Time: 1110-1135 PT Time Calculation (min) (ACUTE ONLY): 25 min  Charges:  $Gait Training: 8-22 mins $Therapeutic Exercise: 8-22 mins                    G CodesLeitha Bleak, PT 11/01/17, 11:55 AM [phone]

## 2017-11-01 NOTE — Progress Notes (Signed)
PHARMACIST - PHYSICIAN COMMUNICATION  DR:   Leslye Peer  CONCERNING: IV to Oral Route Change Policy  RECOMMENDATION: This patient is receiving protonix by the intravenous route.  Based on criteria approved by the Pharmacy and Therapeutics Committee, the intravenous medication(s) is/are being converted to the equivalent oral dose form(s).   DESCRIPTION: These criteria include:  The patient is eating (either orally or via tube) and/or has been taking other orally administered medications for a least 24 hours  The patient has no evidence of active gastrointestinal bleeding or impaired GI absorption (gastrectomy, short bowel, patient on TNA or NPO).  If you have questions about this conversion, please contact the [place] 11/01/2017 10:21 AM

## 2017-11-04 ENCOUNTER — Telehealth: Payer: Self-pay | Admitting: Unknown Physician Specialty

## 2017-11-04 MED ORDER — NYSTATIN 100000 UNIT/ML MT SUSP: 5.0000 mL | Freq: Four times a day (QID) | OROMUCOSAL | 0 refills | Status: DC

## 2017-11-04 NOTE — Telephone Encounter (Signed)
OK for the speech therapy

## 2017-11-04 NOTE — Telephone Encounter (Signed)
Routing to provider for verbal orders 

## 2017-11-04 NOTE — Telephone Encounter (Signed)
Copied from [place]. Topic: Quick Communication - See Telephone Encounter >> Nov 04, 2017  9:00 AM Ahmed Prima L wrote: CRM for notification. See Telephone encounter for:   11/04/17.   Misty from [facility] in [place] stated patient was discharged from the hospital on 2/8 from an acute stroke. She said she needs verbal orders for nursing care. Call back [phone] One week one, two week two, one week three, and one every week other week for three.

## 2017-11-04 NOTE — Telephone Encounter (Signed)
Copied from [employer]. Topic: Quick Communication - See Telephone Encounter >> Nov 04, 2017  2:19 PM Burnis Medin, NT wrote: CRM for notification. See Telephone encounter for: Amy is calling to get verbal orders for home health speech therapy for 2 times a week for  1 week and 1 time a week for 3 weeks. Also therapist is concerned because patient has a white coating on her tongue but pt said coating does not hurt or burn. Therapist said patient is currently taking an antibiotic. She would like a call back.  11/04/17.

## 2017-11-04 NOTE — Telephone Encounter (Signed)
Malachy Mood, please address verbal orders in the message as well.

## 2017-11-04 NOTE — Telephone Encounter (Signed)
Called and let Amy know that Malachy Mood gave the OK for verbal orders.  Called the patient and let her know that [facility] sent her in some Nystatin for the coating on her tongue.

## 2017-11-04 NOTE — Telephone Encounter (Signed)
[place] for nursing care as needed.  I've been following hospital notes

## 2017-11-04 NOTE — Telephone Encounter (Signed)
Routing to provider  

## 2017-11-04 NOTE — Telephone Encounter (Signed)
Called and let Misty know that Cheryl gave the OK for verbal orders.  

## 2017-11-06 ENCOUNTER — Telehealth: Payer: Self-pay | Admitting: Unknown Physician Specialty

## 2017-11-06 NOTE — Telephone Encounter (Signed)
Copied from [place]. Topic: General - Other >> Nov 06, 2017 10:37 AM Marin Olp L wrote: Reason for CRM: Patient say's clopidogrel (PLAVIX) 75 MG tablet is causing her body pains and she'd like to try a substitute. She is losing sleep as a result of the pains and has taken Tylenol to help. Would like a call back to discuss.  Discussed with pt.  She feels that Plavix is causing a lot of pain. Discussed we can't determine if that is causing her problems.  Encouraged continue Tylenol to see if that helps.  Restart Vit D.

## 2017-11-06 NOTE — Telephone Encounter (Signed)
Copied from [place]. Topic: Quick Communication - See Telephone Encounter >> Nov 06, 2017  4:44 PM Percell Belt A wrote: CRM for notification. See Telephone encounter for: Cherly Anderson from [facility] [phone] Need verbals for PT  1 week 1 2 week 2   11/06/17.

## 2017-11-08 ENCOUNTER — Telehealth: Payer: Self-pay | Admitting: Unknown Physician Specialty

## 2017-11-08 NOTE — Telephone Encounter (Signed)
Copied from [place]. Topic: Quick Communication - See Telephone Encounter >> Nov 06, 2017  4:44 PM Percell Belt A wrote: CRM for notification. See Telephone encounter for: Cherly Anderson from [facility] [phone] Need verbals for PT  1 week 1 2 week 2   11/06/17.

## 2017-11-08 NOTE — Telephone Encounter (Signed)
Verbal order OK

## 2017-11-08 NOTE — Telephone Encounter (Signed)
Routing to provider for verbal orders 

## 2017-11-08 NOTE — Telephone Encounter (Signed)
Called and let Raquel Sarna know that [place] gave the OK for verbal orders.

## 2017-11-11 ENCOUNTER — Encounter: Payer: Self-pay | Admitting: Unknown Physician Specialty

## 2017-11-11 ENCOUNTER — Ambulatory Visit (INDEPENDENT_AMBULATORY_CARE_PROVIDER_SITE_OTHER): Payer: Medicare Other | Admitting: Unknown Physician Specialty

## 2017-11-11 VITALS — BP 114/77 | HR 81 | Temp 97.3°F | Wt 168.6 lb

## 2017-11-11 DIAGNOSIS — D72829 Elevated white blood cell count, unspecified: Secondary | ICD-10-CM

## 2017-11-11 DIAGNOSIS — R1310 Dysphagia, unspecified: Secondary | ICD-10-CM | POA: Diagnosis not present

## 2017-11-11 DIAGNOSIS — R7301 Impaired fasting glucose: Secondary | ICD-10-CM

## 2017-11-11 DIAGNOSIS — I6320 Cerebral infarction due to unspecified occlusion or stenosis of unspecified precerebral arteries: Secondary | ICD-10-CM

## 2017-11-11 DIAGNOSIS — G4730 Sleep apnea, unspecified: Secondary | ICD-10-CM | POA: Insufficient documentation

## 2017-11-11 DIAGNOSIS — G4733 Obstructive sleep apnea (adult) (pediatric): Secondary | ICD-10-CM | POA: Diagnosis not present

## 2017-11-11 LAB — BAYER DCA HB A1C WAIVED: HB A1C: 5.4 % (ref <7.0)

## 2017-11-11 NOTE — Assessment & Plan Note (Signed)
Slow improvement.  Continue f/u with speech therapy for advice on tube removal

## 2017-11-11 NOTE — Assessment & Plan Note (Addendum)
Set up appt to f/u with neurology

## 2017-11-11 NOTE — Progress Notes (Signed)
BP 114/77   Pulse 81   Temp (!) 97.3 F (36.3 C) (Oral)   Wt 168 lb 9.6 oz (76.5 kg)   LMP  (LMP Unknown)   SpO2 99%   BMI 31.86 kg/m    Subjective:    Patient ID: Bethany Lara, female    DOB: 10/24/1950, 67 y.o.   MRN: [id number]  HPI: Bethany Lara is a 67 y.o. female  Chief Complaint  Patient presents with  . Hospitalization Follow-up    pt hospitalized from 10/26/16 - 11/01/17 for CVA, still has feeding tube in place   Presented to the hospital 2/2 with dizziness and facial drop.  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  She had trouble swallowing and had a Peg tube placed.  Recently has had some improvement in her swallowing.  Nurse visits twice a week.  PT comes out twice a week.  Speech therapy twice a week.    Stroke Plavix was added, Pravastatin continues.  Takes Tylenol for pain.  Has a Peg tube that is hopefully temporary.    Thrush Using EO to help clear up thrush.    Sleep apnea Discharge note suggests evaluation.  Snores at night.  Often falls asleep in the middle of the day  Elevated WBC Noted in hospital.  F/U on   Relevant past medical, surgical, family and social history reviewed and updated as indicated. Interim medical history since our last visit reviewed. Allergies and medications reviewed and updated.  Review of Systems  Per HPI unless specifically indicated above     Objective:    BP 114/77   Pulse 81   Temp (!) 97.3 F (36.3 C) (Oral)   Wt 168 lb 9.6 oz (76.5 kg)   LMP  (LMP Unknown)   SpO2 99%   BMI 31.86 kg/m   Wt Readings from Last 3 Encounters:  11/11/17 168 lb 9.6 oz (76.5 kg)  11/01/17 171 lb 6.4 oz (77.7 kg)  09/06/17 185 lb 3.2 oz (84 kg)    Physical Exam  Constitutional: She is oriented to person, place, and time. She appears well-developed and well-nourished. No distress.  HENT:  Head: Normocephalic and atraumatic.  Right facial droop  Eyes: Conjunctivae and lids are normal.  Right eye exhibits no discharge. Left eye exhibits no discharge. No scleral icterus.  Neck: Normal range of motion. Neck supple. No JVD present. Carotid bruit is not present.  Cardiovascular: Normal rate, regular rhythm and normal heart sounds.  Pulmonary/Chest: Effort normal and breath sounds normal.  Abdominal: Soft. Normal appearance. There is no splenomegaly or hepatomegaly. There is no tenderness. There is no guarding.  Peg tube  Musculoskeletal: Normal range of motion.  Neurological: She is alert and oriented to person, place, and time.  Skin: Skin is warm, dry and intact. No rash noted. No pallor.  Psychiatric: She has a normal mood and affect. Her behavior is normal. Judgment and thought content normal.      Assessment & Plan:   Problem List Items Addressed This Visit      Unprioritized   CVA (cerebral vascular accident) ([place])    Set up appt to f/u with neurology      Relevant Orders   Ambulatory referral to Neurology   Comprehensive metabolic panel   IFG (impaired fasting glucose) - Primary   Relevant Orders   Comprehensive metabolic panel   Bayer DCA Hb A1c Waived   Sleep apnea  Relevant Orders   Ambulatory referral to Sleep Studies   Swallowing dysfunction    Slow improvement.  Continue f/u with speech therapy for advice on tube removal      Relevant Orders   Ambulatory referral to Neurology    Other Visit Diagnoses    Leukocytosis, unspecified type       Relevant Orders   CBC with Differential/Platelet       Follow up plan: Return in about 4 weeks (around 12/09/2017).

## 2017-11-12 LAB — COMPREHENSIVE METABOLIC PANEL
A/G RATIO: 1.6 (ref 1.2–2.2)
ALBUMIN: 4.1 g/dL (ref 3.6–4.8)
ALK PHOS: 78 IU/L (ref 39–117)
ALT: 22 IU/L (ref 0–32)
AST: 19 IU/L (ref 0–40)
BILIRUBIN TOTAL: 0.5 mg/dL (ref 0.0–1.2)
BUN / CREAT RATIO: 14 (ref 12–28)
BUN: 12 mg/dL (ref 8–27)
CHLORIDE: 102 mmol/L (ref 96–106)
CO2: 23 mmol/L (ref 20–29)
Calcium: 9.8 mg/dL (ref 8.7–10.3)
Creatinine, Ser: 0.88 mg/dL (ref 0.57–1.00)
GFR calc non Af Amer: 68 mL/min/{1.73_m2} (ref >59)
GFR, EST AFRICAN AMERICAN: 78 mL/min/{1.73_m2} (ref >59)
GLOBULIN, TOTAL: 2.5 g/dL (ref 1.5–4.5)
Glucose: 100 mg/dL — ABNORMAL HIGH (ref 65–99)
POTASSIUM: 4.2 mmol/L (ref 3.5–5.2)
SODIUM: 141 mmol/L (ref 134–144)
TOTAL PROTEIN: 6.6 g/dL (ref 6.0–8.5)

## 2017-11-12 LAB — CBC WITH DIFFERENTIAL/PLATELET
BASOS: 1 %
Basophils Absolute: 0.1 10*3/uL (ref 0.0–0.2)
EOS (ABSOLUTE): 0.4 10*3/uL (ref 0.0–0.4)
Eos: 5 %
HEMATOCRIT: 39.1 % (ref 34.0–46.6)
HEMOGLOBIN: 13.7 g/dL (ref 11.1–15.9)
IMMATURE GRANS (ABS): 0 10*3/uL (ref 0.0–0.1)
Immature Granulocytes: 0 %
LYMPHS ABS: 2 10*3/uL (ref 0.7–3.1)
LYMPHS: 23 %
MCH: 29.3 pg (ref 26.6–33.0)
MCHC: 35 g/dL (ref 31.5–35.7)
MCV: 84 fL (ref 79–97)
MONOCYTES: 8 %
Monocytes Absolute: 0.7 10*3/uL (ref 0.1–0.9)
NEUTROS ABS: 5.3 10*3/uL (ref 1.4–7.0)
Neutrophils: 63 %
Platelets: 318 10*3/uL (ref 150–379)
RBC: 4.67 x10E6/uL (ref 3.77–5.28)
RDW: 13.6 % (ref 12.3–15.4)
WBC: 8.5 10*3/uL (ref 3.4–10.8)

## 2017-11-12 NOTE — Progress Notes (Signed)
Pt notified through mychart.

## 2017-11-14 ENCOUNTER — Telehealth: Payer: Self-pay

## 2017-11-14 NOTE — Telephone Encounter (Signed)
Called and left Amy a VM letting her know that Malachy Mood gave the verbal ok for orders.

## 2017-11-14 NOTE — Telephone Encounter (Signed)
Copied from [place] [id number]. Topic: Quick Communication - See Telephone Encounter >> Nov 13, 2017  4:56 PM Percell Belt A wrote: CRM for notification. See Telephone encounter for:  [facility], with [facility] [phone] Verbal orders for pt to intake thin liquids, and small amounts of Puree  foods  11/13/17.  This is not a [facility] patient.

## 2017-11-14 NOTE — Telephone Encounter (Signed)
Verbal OK

## 2017-11-14 NOTE — Telephone Encounter (Signed)
Routing to provider for verbal order. 

## 2017-11-14 NOTE — Telephone Encounter (Signed)
Pt of Bethany Lara; request for orders; also see CRM # [phone]

## 2017-11-18 NOTE — Telephone Encounter (Signed)
Amy speech therapist with [facility] calling about a couple of things.  1. She would like to request an additional visit with the patient for this week and next week.  2. She would like to advance her diet from puree to finely chopped foods.  3. Instead of crushing her pills and putting them in feeding tube. Crush them and give them in applesauce.  4. Gradually decrease tube feedings as regular feedings increase or have a dietitian from [facility] come and work on that.   Call back number [phone].

## 2017-11-19 NOTE — Telephone Encounter (Signed)
Verbal orders given  

## 2017-11-19 NOTE — Telephone Encounter (Signed)
Amy with [facility] is calling back about verbal orders. Please advise.  [phone]

## 2017-11-19 NOTE — Telephone Encounter (Signed)
Routing to provider for verbal orders 

## 2017-11-19 NOTE — Telephone Encounter (Signed)
Called and let Amy know that Cheryl gave the OK for orders.  

## 2017-11-22 ENCOUNTER — Other Ambulatory Visit: Payer: Self-pay | Admitting: Unknown Physician Specialty

## 2017-11-22 NOTE — Telephone Encounter (Signed)
Plavix refill. Last refill of medication was by another provider.   LOV: 11/11/17  PCP: [place] [place]

## 2017-11-22 NOTE — Telephone Encounter (Signed)
Copied from [place] [phone]. Topic: Quick Communication - Rx Refill/Question >> Nov 22, 2017  2:52 PM Arletha Grippe wrote: Medication: clopidogrel (PLAVIX) 75 MG tablet  Has the patient contacted their pharmacy? Yes.   Told to call us    (Agent: If no, request that the patient contact the pharmacy for the refill.)   Preferred Pharmacy (with phone number [facility] street [facility] [place]     Agent: Please be advised that RX refills may take up to 3 business days. We ask that you follow-up with your pharmacy.

## 2017-11-25 MED ORDER — CLOPIDOGREL BISULFATE 75 MG PO TABS: 75.0000 mg | ORAL_TABLET | Freq: Every day | ORAL | 0 refills | Status: DC

## 2017-11-26 ENCOUNTER — Telehealth: Payer: Self-pay | Admitting: Unknown Physician Specialty

## 2017-11-26 NOTE — Telephone Encounter (Signed)
Routing to provider  

## 2017-11-26 NOTE — Telephone Encounter (Signed)
OK to give verbal.

## 2017-11-26 NOTE — Telephone Encounter (Signed)
Copied from [place] [id number]. Topic: Quick Communication - See Telephone Encounter >> Nov 26, 2017  3:39 PM Synthia Innocent wrote: CRM for notification. See Telephone encounter for:  Requesting new verbal orders for Speech therapy starting next week 1x a week for 2 weeks 11/26/17.

## 2017-11-26 NOTE — Telephone Encounter (Signed)
Called and let Amy know that Dr. Wynetta Emery gave the verbal OK for speech therapy orders.

## 2017-11-30 ENCOUNTER — Encounter: Payer: Self-pay | Admitting: Unknown Physician Specialty

## 2017-12-09 ENCOUNTER — Encounter: Payer: Self-pay | Admitting: Unknown Physician Specialty

## 2017-12-09 ENCOUNTER — Ambulatory Visit (INDEPENDENT_AMBULATORY_CARE_PROVIDER_SITE_OTHER): Payer: Medicare Other | Admitting: Unknown Physician Specialty

## 2017-12-09 VITALS — BP 123/82 | HR 90 | Temp 97.9°F | Wt 177.2 lb

## 2017-12-09 DIAGNOSIS — I6349 Cerebral infarction due to embolism of other cerebral artery: Secondary | ICD-10-CM | POA: Diagnosis not present

## 2017-12-09 DIAGNOSIS — R1310 Dysphagia, unspecified: Secondary | ICD-10-CM

## 2017-12-09 DIAGNOSIS — G4733 Obstructive sleep apnea (adult) (pediatric): Secondary | ICD-10-CM | POA: Diagnosis not present

## 2017-12-09 NOTE — Assessment & Plan Note (Addendum)
Recovering well.  Able to swallow.  Continue Plavix and Pravastatin.  Appt with GI to remove feeding tube.  BP at goal without medication

## 2017-12-09 NOTE — Assessment & Plan Note (Signed)
Has had a sleep study and markedly positive.  Due for a CPAP trial

## 2017-12-09 NOTE — Progress Notes (Signed)
   BP 123/82   Pulse 90   Temp 97.9 F (36.6 C) (Oral)   Wt 177 lb 3.2 oz (80.4 kg)   LMP  (LMP Unknown)   SpO2 98%   BMI 33.48 kg/m    Subjective:    Patient ID: Bethany Lara, female    DOB: [birth year], 67 y.o.   MRN: [id number]  HPI: Bethany Lara is a 67 y.o. female  Chief Complaint  Patient presents with  . Follow-up   CVA Pt is here to f/u of stroke and secondary swallowing difficulties.  She is now eating normal foods and would like her feeding tube removed.  Had it placed by Dr Marius Ditch in GI.  Continued work with speech therapy.    Reviewed notes from Dr. Manuella Ghazi.  Recommendation to continue Plavix and optimize cholesterol management with statin. Taking Plavix with LDL below 100  Sleep apnea -  Has had a sleep study.   Markedly positive   Relevant past medical, surgical, family and social history reviewed and updated as indicated. Interim medical history since our last visit reviewed. Allergies and medications reviewed and updated.  Review of Systems  Per HPI unless specifically indicated above     Objective:    BP 123/82   Pulse 90   Temp 97.9 F (36.6 C) (Oral)   Wt 177 lb 3.2 oz (80.4 kg)   LMP  (LMP Unknown)   SpO2 98%   BMI 33.48 kg/m   Wt Readings from Last 3 Encounters:  12/09/17 177 lb 3.2 oz (80.4 kg)  11/11/17 168 lb 9.6 oz (76.5 kg)  11/01/17 171 lb 6.4 oz (77.7 kg)    Physical Exam  Constitutional: She is oriented to person, place, and time. She appears well-developed and well-nourished. No distress.  HENT:  Head: Normocephalic and atraumatic.  Eyes: Conjunctivae and lids are normal. Right eye exhibits no discharge. Left eye exhibits no discharge. No scleral icterus.  Neck: Normal range of motion. Neck supple. No JVD present. Carotid bruit is not present.  Cardiovascular: Normal rate, regular rhythm and normal heart sounds.  Pulmonary/Chest: Effort normal and breath sounds normal.  Abdominal: Normal appearance. There is no  splenomegaly or hepatomegaly.  Musculoskeletal: Normal range of motion.  Neurological: She is alert and oriented to person, place, and time.  Skin: Skin is warm, dry and intact. No rash noted. No pallor.  Psychiatric: She has a normal mood and affect. Her behavior is normal. Judgment and thought content normal.     Assessment & Plan:   Problem List Items Addressed This Visit      Unprioritized   CVA (cerebrovascular accident) ([place])    Recovering well.  Able to swallow.  Continue Plavix and Pravastatin.  Appt with GI to remove feeding tube.  BP at goal without medication      Relevant Medications   fenofibrate 160 MG tablet   Sleep apnea    Has had a sleep study and markedly positive.  Due for a CPAP trial      Swallowing dysfunction - Primary   Relevant Orders   Ambulatory referral to Gastroenterology       Follow up plan: Return for f/u in August.

## 2017-12-10 ENCOUNTER — Ambulatory Visit: Payer: Medicare Other | Admitting: Gastroenterology

## 2017-12-10 ENCOUNTER — Encounter: Payer: Self-pay | Admitting: Gastroenterology

## 2017-12-10 VITALS — BP 136/86 | HR 66 | Ht 61.0 in | Wt 177.0 lb

## 2017-12-10 DIAGNOSIS — Z431 Encounter for attention to gastrostomy: Secondary | ICD-10-CM | POA: Diagnosis not present

## 2017-12-10 NOTE — Progress Notes (Signed)
Cephas Darby, MD [address]  [place]  [place],  [address]  Main: [phone]  Fax: [phone]    Gastroenterology Consultation  Referring Provider:     Kathrine Haddock, NP Primary Care Physician:  Kathrine Haddock, NP Primary Gastroenterologist:  Dr. Cephas Darby Reason for Consultation:     PEG removal        HPI:   Bethany Lara is a 67 y.o. female referred by Dr. Kathrine Haddock, NP  for consultation & management of PEG removal. She recently had a PEG placement by me on 10/30/2017 after stroke that resulted in dysphagia. She has been tolerating tube feeds well. She is undergoing speech therapy as an outpatient and her swallow has gotten more stronger and has been eating regular food. She has stopped using her PEG in last 2 weeks. Patient denies any GI symptoms. She is cleared by speech pathology as well as her primary care doctor and referred here for removal of PEG.  NSAIDs: None  Antiplts/Anticoagulants/Anti thrombotics: Aspirin 81  GI Procedures: Colonoscopy 2014, report not available. Per patient, it was normal EGD 10/30/2017 - Normal duodenal bulb and second portion of the duodenum. - Normal stomach. - Normal gastroesophageal junction and esophagus. - An externally removable PEG placement was successfully completed. - No specimens collected.  Past Medical History:  Diagnosis Date  . Allergy   . Carpal tunnel syndrome   . Hyperlipidemia   . Stroke [facility])     Past Surgical History:  Procedure Laterality Date  . BUNIONECTOMY    . CARPAL TUNNEL RELEASE  11/14/2015  . CHOLECYSTECTOMY    . PEG PLACEMENT N/A 10/30/2017   Procedure: PERCUTANEOUS ENDOSCOPIC GASTROSTOMY (PEG) PLACEMENT;  Surgeon: Lin Landsman, MD;  Location: [facility] ENDOSCOPY;  Service: Gastroenterology;  Laterality: N/A;    Current Outpatient Medications:  .  cetirizine (ZYRTEC) 10 MG tablet, Place 1 tablet (10 mg total) into feeding tube daily., Disp: 30 tablet, Rfl:  11 .  clopidogrel (PLAVIX) 75 MG tablet, Place 1 tablet (75 mg total) into feeding tube daily., Disp: 30 tablet, Rfl: 0 .  fenofibrate 160 MG tablet, Take 160 mg by mouth daily., Disp: , Rfl: 2 .  fluticasone (FLONASE) 50 MCG/ACT nasal spray, Place 2 sprays into both nostrils 2 (two) times daily., Disp: 16 g, Rfl: 11 .  Multiple Vitamin (MULTIVITAMIN) tablet, Place 1 tablet into feeding tube daily., Disp: , Rfl:  .  pravastatin (PRAVACHOL) 40 MG tablet, Place 1 tablet (40 mg total) into feeding tube at bedtime., Disp: 90 tablet, Rfl: 3 .  Vitamin D, Cholecalciferol, 1000 units TABS, Give 2,000 Units by tube daily., Disp: 60 tablet, Rfl:    Family History  Problem Relation Age of Onset  . Diabetes Mother   . Hypertension Mother   . Dementia Mother   . Diabetes Father   . Heart disease Father   . Alzheimer's disease Father   . Glaucoma Father   . Colon cancer Father   . Breast cancer Maternal Aunt      Social History   Tobacco Use  . Smoking status: Never Smoker  . Smokeless tobacco: Never Used  Substance Use Topics  . Alcohol use: No    Alcohol/week: 0.0 oz  . Drug use: No    Allergies as of 12/10/2017 - Review Complete 12/10/2017  Allergen Reaction Noted  . Zocor [simvastatin] Other (See Comments) 07/15/2015    Review of Systems:    All systems reviewed and negative except where  noted in HPI.   Physical Exam:  BP 136/86   Pulse 66   Ht 5\' 1"  (1.549 m)   Wt 177 lb (80.3 kg)   LMP  (LMP Unknown)   BMI 33.44 kg/m  No LMP recorded (lmp unknown). Patient is postmenopausal.  General:   Alert,  Well-developed, well-nourished, pleasant and cooperative in NAD Head:  Normocephalic and atraumatic. Eyes:  Sclera clear, no icterus.   Conjunctiva pink. Ears:  Normal auditory acuity. Nose:  No deformity, discharge, or lesions. Mouth:  No deformity or lesions,oropharynx pink & moist. Neck:  Supple; no masses or thyromegaly. Lungs:  Respirations even and unlabored.  Clear  throughout to auscultation.   No wheezes, crackles, or rhonchi. No acute distress. Heart:  Regular rate and rhythm; no murmurs, clicks, rubs, or gallops. Abdomen:  Normal bowel sounds. Soft, non-tender and non-distended without masses, hepatosplenomegaly or hernias noted.  No guarding or rebound tenderness.   Rectal: Not performed Msk:  Symmetrical without gross deformities. Good, equal movement & strength bilaterally. Pulses:  Normal pulses noted. Extremities:  No clubbing or edema.  No cyanosis. Neurologic:  Alert and oriented x3;  grossly normal neurologically. Skin:  Intact without significant lesions or rashes. No jaundice. Lymph Nodes:  No significant cervical adenopathy. Psych:  Alert and cooperative. Normal mood and affect.  Imaging Studies: none  Assessment and Plan:   Bethany Lara is a 67 y.o. female with recent basal ganglia stroke, resulted in dysphagia status post PEG placement on 10/30/2017 is seen in consultation for PEG removal. Patient has been tolerating by mouth diet well and is cleared by speech pathology for PEG removal.  PEG removed today with no complications, minimal bleeding at the site.  Follow up As needed   Cephas Darby, MD

## 2017-12-10 NOTE — Patient Instructions (Signed)
High-Fiber Diet  Fiber, also called dietary fiber, is a type of carbohydrate found in fruits, vegetables, whole grains, and beans. A high-fiber diet can have many health benefits. Your health care provider may recommend a high-fiber diet to help:  · Prevent constipation. Fiber can make your bowel movements more regular.  · Lower your cholesterol.  · Relieve hemorrhoids, uncomplicated diverticulosis, or irritable bowel syndrome.  · Prevent overeating as part of a weight-loss plan.  · Prevent heart disease, type 2 diabetes, and certain cancers.    What is my plan?  The recommended daily intake of fiber includes:  · 38 grams for men under age 50.  · 30 grams for men over age 50.  · 25 grams for women under age 50.  · 21 grams for women over age 50.    You can get the recommended daily intake of dietary fiber by eating a variety of fruits, vegetables, grains, and beans. Your health care provider may also recommend a fiber supplement if it is not possible to get enough fiber through your diet.  What do I need to know about a high-fiber diet?  · Fiber supplements have not been widely studied for their effectiveness, so it is better to get fiber through food sources.  · Always check the fiber content on the nutrition facts label of any prepackaged food. Look for foods that contain at least 5 grams of fiber per serving.  · Ask your dietitian if you have questions about specific foods that are related to your condition, especially if those foods are not listed in the following section.  · Increase your daily fiber consumption gradually. Increasing your intake of dietary fiber too quickly may cause bloating, cramping, or gas.  · Drink plenty of water. Water helps you to digest fiber.  What foods can I eat?  Grains  Whole-grain breads. Multigrain cereal. Oats and oatmeal. Brown rice. Barley. Bulgur wheat. Millet. Bran muffins. Popcorn. Rye wafer crackers.  Vegetables   Sweet potatoes. Spinach. Kale. Artichokes. Cabbage. Broccoli. Green peas. Carrots. Squash.  Fruits  Berries. Pears. Apples. Oranges. Avocados. Prunes and raisins. Dried figs.  Meats and Other Protein Sources  [place]y, kidney, pinto, and soy beans. Split peas. Lentils. Nuts and seeds.  Dairy  Fiber-fortified yogurt.  Beverages  Fiber-fortified soy milk. Fiber-fortified orange juice.  Other  Fiber bars.  The items listed above may not be a complete list of recommended foods or beverages. Contact your dietitian for more options.  What foods are not recommended?  Grains  White bread. Pasta made with refined flour. White rice.  Vegetables  Fried potatoes. Canned vegetables. Well-cooked vegetables.  Fruits  Fruit juice. Cooked, strained fruit.  Meats and Other Protein Sources  Fatty cuts of meat. Fried poultry or fried fish.  Dairy  Milk. Yogurt. Cream cheese. Sour cream.  Beverages  Soft drinks.  Other  Cakes and pastries. Butter and oils.  The items listed above may not be a complete list of foods and beverages to avoid. Contact your dietitian for more information.  What are some tips for including high-fiber foods in my diet?  · Eat a wide variety of high-fiber foods.  · Make sure that half of all grains consumed each day are whole grains.  · Replace breads and cereals made from refined flour or white flour with whole-grain breads and cereals.  · Replace white rice with brown rice, bulgur wheat, or millet.  · Start the day with a breakfast that is high in fiber,   such as a cereal that contains at least 5 grams of fiber per serving.  · Use beans in place of meat in soups, salads, or pasta.  · Eat high-fiber snacks, such as berries, raw vegetables, nuts, or popcorn.  This information is not intended to replace advice given to you by your health care provider. Make sure you discuss any questions you have with your health care provider.  Document Released: 09/10/2005 Document Revised: 02/16/2016 Document Reviewed: 02/23/2014   Elsevier Interactive Patient Education © 2018 Elsevier Inc.

## 2017-12-12 ENCOUNTER — Institutional Professional Consult (permissible substitution): Payer: Self-pay | Admitting: Neurology

## 2017-12-18 ENCOUNTER — Other Ambulatory Visit: Payer: Self-pay | Admitting: Unknown Physician Specialty

## 2017-12-20 ENCOUNTER — Other Ambulatory Visit: Payer: Self-pay

## 2017-12-20 MED ORDER — CLOPIDOGREL BISULFATE 75 MG PO TABS: 75.0000 mg | ORAL_TABLET | Freq: Every day | ORAL | 0 refills | Status: DC

## 2017-12-20 MED ORDER — PRAVASTATIN SODIUM 40 MG PO TABS: 40.0000 mg | ORAL_TABLET | Freq: Every day | ORAL | 3 refills | Status: DC

## 2017-12-20 MED ORDER — FENOFIBRATE 160 MG PO TABS: 160.0000 mg | ORAL_TABLET | Freq: Every day | ORAL | 2 refills | Status: DC

## 2017-12-20 NOTE — Telephone Encounter (Signed)
New pharmacy

## 2017-12-24 ENCOUNTER — Other Ambulatory Visit: Payer: Self-pay

## 2017-12-24 MED ORDER — PRAVASTATIN SODIUM 40 MG PO TABS: 40.0000 mg | ORAL_TABLET | Freq: Every day | ORAL | 3 refills | Status: DC

## 2017-12-24 NOTE — Telephone Encounter (Signed)
Current RX already written but can we please change to normal so it will go to the pharmacy? Most recent says no print so the prescription did not go anywhere.

## 2018-01-21 ENCOUNTER — Ambulatory Visit: Payer: Medicare Other | Admitting: Unknown Physician Specialty

## 2018-01-23 ENCOUNTER — Other Ambulatory Visit: Payer: Self-pay | Admitting: Unknown Physician Specialty

## 2018-01-23 ENCOUNTER — Telehealth: Payer: Self-pay | Admitting: Unknown Physician Specialty

## 2018-01-23 MED ORDER — CLOPIDOGREL BISULFATE 75 MG PO TABS: 75.0000 mg | ORAL_TABLET | Freq: Every day | ORAL | 0 refills | Status: DC

## 2018-01-23 NOTE — Telephone Encounter (Signed)
Copied from [place] [id number]. Topic: Quick Communication - Rx Refill/Question >> Jan 23, 2018  2:20 PM Neva Seat wrote: clopidogrel (PLAVIX) 75 MG tablet - 3 month supply - not cost for pt   [place] [place] [address] [place] [address] Phone: [phone] Fax: [phone]

## 2018-01-24 ENCOUNTER — Other Ambulatory Visit: Payer: Self-pay

## 2018-01-24 MED ORDER — FLUTICASONE PROPIONATE 50 MCG/ACT NA SUSP: 2.0000 | Freq: Two times a day (BID) | NASAL | 11 refills | Status: DC

## 2018-01-24 NOTE — Telephone Encounter (Signed)
Patient last seen 12/09/17 and has f/up 05/12/18.

## 2018-03-13 ENCOUNTER — Other Ambulatory Visit: Payer: Self-pay | Admitting: Unknown Physician Specialty

## 2018-03-14 NOTE — Telephone Encounter (Signed)
clopidogrel refill Last Refill:01/24/18 # 30  Last OV: 12/09/17 PCP: Kathrine Haddock NP Pharmacy:[facility]

## 2018-04-11 ENCOUNTER — Encounter: Payer: Self-pay | Admitting: Unknown Physician Specialty

## 2018-04-14 ENCOUNTER — Telehealth: Payer: Self-pay | Admitting: Unknown Physician Specialty

## 2018-04-14 NOTE — Telephone Encounter (Signed)
Copied from [place] [id number]. Topic: Appointment Scheduling - Scheduling Inquiry for Clinic >> Apr 14, 2018 11:07 AM Percell Belt A wrote: Reason for CRM:  Pt called in and stated she was a pt of [facility] and would like to know if Orene Desanctis would take her on as a Patient?  She stated she has seen her a few time and would really like to stick with her??    Please advise.  Pt has an appt in aug with Adriana she would like to switch to [place] if ok'd  Best number [phone]   >> Apr 14, 2018  4:26 PM Shaune Pollack wrote: Malachy Mood please see message from [facility] and advise if ok to place with Apolonio Schneiders.  Thank you  This is fine.  Thanks

## 2018-04-17 ENCOUNTER — Ambulatory Visit: Payer: Medicare Other | Admitting: Cardiovascular Disease

## 2018-05-01 ENCOUNTER — Ambulatory Visit: Payer: Medicare Other | Admitting: Cardiovascular Disease

## 2018-05-01 ENCOUNTER — Encounter: Payer: Self-pay | Admitting: Cardiovascular Disease

## 2018-05-01 VITALS — BP 132/72 | HR 62 | Ht 61.0 in | Wt 191.8 lb

## 2018-05-01 DIAGNOSIS — R9431 Abnormal electrocardiogram [ECG] [EKG]: Secondary | ICD-10-CM

## 2018-05-01 DIAGNOSIS — Z8673 Personal history of transient ischemic attack (TIA), and cerebral infarction without residual deficits: Secondary | ICD-10-CM | POA: Diagnosis not present

## 2018-05-01 DIAGNOSIS — R002 Palpitations: Secondary | ICD-10-CM | POA: Diagnosis not present

## 2018-05-01 DIAGNOSIS — R0602 Shortness of breath: Secondary | ICD-10-CM

## 2018-05-01 NOTE — Patient Instructions (Addendum)
Medication Instructions: Your physician recommends that you continue on your current medications as directed. Please refer to the Current Medication list given to you today.  If you need a refill on your cardiac medications before your next appointment, please call your pharmacy.   Procedures/Testing: Your physician has recommended that you wear a 30 day event monitor. Event monitors are medical devices that record the heart's electrical activity. Doctors most often [place] these monitors to diagnose arrhythmias. Arrhythmias are problems with the speed or rhythm of the heartbeat. The monitor is a small, portable device. You can wear one while you do your normal daily activities. This is usually used to diagnose what is causing palpitations/syncope (passing out).  - You will be mailed a monitor from [employer].  - They will call you in the next day or so to verify your address. Then it will take 5-7 days to be mailed to you. - You will wear for 30 days and then place all the pieces of equipment that came with the device back in the provided box and take it to your nearest UPS drop off locations. - Call [employer] at [phone], if you have any questions concerning the monitor once you have received it. - DO NOT GET THE MONITOR WET.   Your physician has requested that you have an exercise stress myoview. For further information please visit [email or web address]. Please follow instruction sheet, as given.    Follow-Up: Your physician wants you to follow-up as needed with Dr. Fletcher Anon.   Thank you for choosing [facility]!    [place]  Your provider has ordered a Stress Test with nuclear imaging. The purpose of this test is to evaluate the blood supply to your heart muscle. This procedure is referred to as a "Non-Invasive Stress Test." This is because other than having an IV started in your vein, nothing is inserted or "invades" your body. Cardiac stress tests are done to find  areas of poor blood flow to the heart by determining the extent of coronary artery disease (CAD). Some patients exercise on a treadmill, which naturally increases the blood flow to your heart, while others who are unable to walk on a treadmill due to physical limitations have a pharmacologic/chemical stress agent called Lexiscan . This medicine will mimic walking on a treadmill by temporarily increasing your coronary blood flow.   Please note: these test may take anywhere between 2-4 hours to complete  PLEASE REPORT TO [facility] AT THE FIRST DESK WILL DIRECT YOU WHERE TO GO  Date of Procedure:_____________________________________  Arrival Time for Procedure:______________________________  Instructions regarding medication:  None to hold   PLEASE NOTIFY THE OFFICE AT LEAST 24 HOURS IN ADVANCE IF YOU ARE UNABLE TO KEEP YOUR APPOINTMENT.  [phone] AND  PLEASE NOTIFY NUCLEAR MEDICINE AT [facility] AT LEAST 24 HOURS IN ADVANCE IF YOU ARE UNABLE TO KEEP YOUR APPOINTMENT. [phone]  How to prepare for your Myoview test:  1. Do not eat or drink after midnight 2. No caffeine for 24 hours prior to test 3. No smoking 24 hours prior to test. 4. Your medication may be taken with water.  If your doctor stopped a medication because of this test, do not take that medication. 5. Ladies, please do not wear dresses.  Skirts or pants are appropriate. Please wear a short sleeve shirt. 6. No perfume, cologne or lotion. 7. Wear comfortable walking shoes. No heels!

## 2018-05-01 NOTE — Progress Notes (Signed)
Cardiology Office Note   Date:  05/01/2018   ID:  Bethany Lara, DOB 1951-04-20, MRN [id number]  PCP:  Volney American, PA-C  Cardiologist:   Kathlyn Sacramento, MD   Chief Complaint  Patient presents with  . OTHER    Afib c/o occassional sob/rapid heart beat in the evenings/stress. Meds reviewed verbally with pt.      History of Present Illness: Bethany Lara is a 67 y.o. female who was referred by Dr. Manuella Ghazi for evaluation of possible atrial fibrillation given the patient's history of stroke.  The patient has no prior cardiac history.  We actually saw the patient last year after she was hospitalized with dizziness and vertigo.  She was noted to be bradycardic during that admission with sinus bradycardia down to the 40s.  We felt that her bradycardia was not responsible for her symptoms.  She was hospitalized in February with vertigo, right facial droop and dysarthria.  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  She had an echocardiogram done which showed normal LV systolic function with no structural valve abnormalities.  No cardiac source of embolism was identified. The patient reports palpitations mostly in the evening but these have actually improved recently.  She denies any chest pain.  She does report shortness of breath with exertion which started recently.  No orthopnea, PND or leg edema. She is not a smoker.  She does have family history of coronary artery disease affecting her father.  Her mother had recurrent strokes.   Past Medical History:  Diagnosis Date  . Allergy   . Arrhythmia   . Carpal tunnel syndrome   . Hyperlipidemia   . Stroke [facility])     Past Surgical History:  Procedure Laterality Date  . BUNIONECTOMY    . CARPAL TUNNEL RELEASE  11/14/2015  . CHOLECYSTECTOMY    . PEG PLACEMENT N/A 10/30/2017   Procedure: PERCUTANEOUS ENDOSCOPIC GASTROSTOMY (PEG) PLACEMENT;  Surgeon: Lin Landsman, MD;  Location: [facility] ENDOSCOPY;   Service: Gastroenterology;  Laterality: N/A;     Current Outpatient Medications  Medication Sig Dispense Refill  . cetirizine (ZYRTEC) 10 MG tablet Place 1 tablet (10 mg total) into feeding tube daily. (Patient taking differently: Take 10 mg by mouth daily. ) 30 tablet 11  . clopidogrel (PLAVIX) 75 MG tablet TAKE 1 TABLET BY MOUTH  DAILY 30 tablet 0  . fenofibrate 160 MG tablet Take 1 tablet (160 mg total) by mouth daily. 90 tablet 2  . fluticasone (FLONASE) 50 MCG/ACT nasal spray Place 2 sprays into both nostrils 2 (two) times daily. 16 g 11  . Multiple Vitamin (MULTIVITAMIN) tablet Place 1 tablet into feeding tube daily. (Patient taking differently: Take 1 tablet by mouth daily. )    . NON FORMULARY CPAP @@ bedtime.    . pravastatin (PRAVACHOL) 40 MG tablet Place 1 tablet (40 mg total) into feeding tube at bedtime. (Patient taking differently: Take 40 mg by mouth at bedtime. ) 90 tablet 3  . Vitamin D, Cholecalciferol, 1000 units TABS Give 2,000 Units by tube daily. (Patient taking differently: Take 2,000 Units by mouth daily. ) 60 tablet    No current facility-administered medications for this visit.     Allergies:   Zocor [simvastatin]    Social History:  The patient  reports that she has never smoked. She has never used smokeless tobacco. She reports that she does not drink alcohol or use drugs.   Family History:  The  patient's family history includes Alzheimer's disease in her father; Breast cancer in her maternal aunt; Colon cancer in her father; Dementia in her mother; Diabetes in her father and mother; Glaucoma in her father; Heart attack in her father; Heart disease in her father; Heart murmur in her mother; Hypertension in her mother.    ROS:  Please see the history of present illness.   Otherwise, review of systems are positive for none.   All other systems are reviewed and negative.    PHYSICAL EXAM: VS:  BP 132/72 (BP Location: Right Arm, Patient Position: Sitting, Cuff  Size: Large)   Pulse 62   Ht 5\' 1"  (1.549 m)   Wt 191 lb 12 oz (87 kg)   LMP  (LMP Unknown)   BMI 36.23 kg/m  , BMI Body mass index is 36.23 kg/m. GEN: Well nourished, well developed, in no acute distress  HEENT: normal  Neck: no JVD, carotid bruits, or masses Cardiac: RRR; no murmurs, rubs, or gallops,no edema  Respiratory:  clear to auscultation bilaterally, normal work of breathing GI: soft, nontender, nondistended, + BS MS: no deformity or atrophy  Skin: warm and dry, no rash Neuro:  Strength and sensation are intact Psych: euthymic mood, full affect   EKG:  EKG is ordered today. The ekg ordered today demonstrates normal sinus rhythm with minimal LVH.  Possible old inferior infarct and poor R wave progression in the anterior leads.   Recent Labs: 05/20/2017: TSH 0.956 10/28/2017: Magnesium 2.4 11/11/2017: ALT 22; BUN 12; Creatinine, Ser 0.88; Hemoglobin 13.7; Platelets 318; Potassium 4.2; Sodium 141    Lipid Panel    Component Value Date/Time   CHOL 190 10/27/2017 0631   CHOL 211 (H) 07/24/2017 0855   CHOL 184 01/21/2017 0855   TRIG 150 (H) 10/27/2017 0631   TRIG 144 01/21/2017 0855   HDL 61 10/27/2017 0631   HDL 53 07/24/2017 0855   CHOLHDL 3.1 10/27/2017 0631   VLDL 30 10/27/2017 0631   VLDL 29 01/21/2017 0855   LDLCALC 99 10/27/2017 0631   LDLCALC 115 (H) 07/24/2017 0855      Wt Readings from Last 3 Encounters:  05/01/18 191 lb 12 oz (87 kg)  12/10/17 177 lb (80.3 kg)  12/09/17 177 lb 3.2 oz (80.4 kg)       PAD Screen 05/01/2018  Previous PAD dx? No  Previous surgical procedure? No  Pain with walking? Yes  Subsides with rest? Yes  Feet/toe relief with dangling? No  Painful, non-healing ulcers? No  Extremities discolored? No      ASSESSMENT AND PLAN:  1.  Palpitations and history of stroke: I agree that we have to exclude atrial fibrillation.  Due to that, I recommend a 30-day outpatient monitor.  If this is nonrevealing and if the patient has  recurrent TIAs or strokes, then she might need a prolonged loop recorder.  2.  Exertional dyspnea with abnormal EKG: I recommend ischemic cardiac evaluation with a treadmill Myoview.  3.  History of stroke: Echocardiogram showed no cardiac source of embolism.  If any recurrent episodes, recommend a transesophageal echocardiogram if an embolic etiology is highly suspected.    Disposition:   FU with me as needed  Signed,  Kathlyn Sacramento, MD  05/01/2018 9:46 AM    [place]

## 2018-05-09 ENCOUNTER — Ambulatory Visit
Admission: RE | Admit: 2018-05-09 | Discharge: 2018-05-09 | Disposition: A | Discharge status: Home / Self Care | Payer: Medicare Other | Source: Ambulatory Visit | Attending: Cardiovascular Disease | Admitting: Cardiovascular Disease

## 2018-05-09 DIAGNOSIS — R0602 Shortness of breath: Secondary | ICD-10-CM

## 2018-05-09 LAB — NM MYOCAR MULTI W/SPECT W/WALL MOTION / EF
CHL CUP NUCLEAR SDS: 0
CHL CUP NUCLEAR SRS: 0
CHL CUP NUCLEAR SSS: 0
CHL CUP RESTING HR STRESS: 52 {beats}/min
CSEPPHR: 142 {beats}/min
Estimated workload: 7 METS
Exercise duration (min): 5 min
Exercise duration (sec): 10 s
LVDIAVOL: 45 mL (ref 46–106)
LVSYSVOL: 12 mL
Percent HR: 93 %
TID: 0.75

## 2018-05-09 MED ORDER — TECHNETIUM TC 99M TETROFOSMIN IV KIT
13.0000 | PACK | Freq: Once | INTRAVENOUS | Status: AC | PRN
  Administered 2018-05-09: 12.06 via INTRAVENOUS

## 2018-05-09 MED ORDER — TECHNETIUM TC 99M TETROFOSMIN IV KIT
30.0000 | PACK | Freq: Once | INTRAVENOUS | Status: AC | PRN
  Administered 2018-05-09: 32.13 via INTRAVENOUS

## 2018-05-12 ENCOUNTER — Ambulatory Visit: Payer: Medicare Other | Admitting: Unknown Physician Specialty

## 2018-05-14 ENCOUNTER — Encounter: Payer: Self-pay | Admitting: Family Medicine

## 2018-05-14 ENCOUNTER — Telehealth: Payer: Self-pay | Admitting: Cardiovascular Disease

## 2018-05-14 ENCOUNTER — Ambulatory Visit: Payer: Medicare Other | Admitting: Physician Assistant

## 2018-05-14 ENCOUNTER — Ambulatory Visit (INDEPENDENT_AMBULATORY_CARE_PROVIDER_SITE_OTHER): Payer: Medicare Other | Admitting: Family Medicine

## 2018-05-14 VITALS — BP 123/82 | HR 86 | Wt 191.0 lb

## 2018-05-14 DIAGNOSIS — Z1239 Encounter for other screening for malignant neoplasm of breast: Secondary | ICD-10-CM

## 2018-05-14 DIAGNOSIS — R1310 Dysphagia, unspecified: Secondary | ICD-10-CM

## 2018-05-14 DIAGNOSIS — G4733 Obstructive sleep apnea (adult) (pediatric): Secondary | ICD-10-CM

## 2018-05-14 DIAGNOSIS — E7849 Other hyperlipidemia: Secondary | ICD-10-CM

## 2018-05-14 DIAGNOSIS — Z5181 Encounter for therapeutic drug level monitoring: Secondary | ICD-10-CM

## 2018-05-14 DIAGNOSIS — Z8673 Personal history of transient ischemic attack (TIA), and cerebral infarction without residual deficits: Secondary | ICD-10-CM

## 2018-05-14 DIAGNOSIS — Z7902 Long term (current) use of antithrombotics/antiplatelets: Secondary | ICD-10-CM

## 2018-05-14 DIAGNOSIS — Z1231 Encounter for screening mammogram for malignant neoplasm of breast: Secondary | ICD-10-CM

## 2018-05-14 DIAGNOSIS — R002 Palpitations: Secondary | ICD-10-CM

## 2018-05-14 NOTE — Telephone Encounter (Signed)
Patient calling to discuss recent stress test results   Please call

## 2018-05-14 NOTE — Telephone Encounter (Signed)
Patient made aware of results and verbalized understanding.  Notes recorded by Wellington Hampshire, MD on 05/13/2018 at 6:04 PM EDT Inform patient that stress test was normal.

## 2018-05-14 NOTE — Patient Instructions (Signed)
[facility] - [phone]    

## 2018-05-14 NOTE — Progress Notes (Signed)
BP 123/82   Pulse 86   Wt 191 lb (86.6 kg)   LMP  (LMP Unknown)   SpO2 98%   BMI 36.09 kg/m    Subjective:    Patient ID: Bethany Lara, female    DOB: 1951/09/23, 67 y.o.   MRN: [id number]  HPI: Bethany Lara is a 67 y.o. female  Chief Complaint  Patient presents with  . Follow-up  . Cerebrovascular Accident    Patient states she is doing okay. No longer needs feeding tube, due to speech therapy. Has appointment next week with cardiologist for 30 day Holter monitor.    Pt here today for regular follow up.   Followed by Neurology for her recent CVA diagnosed 10/26/2017. Has completed speech therapy and doing very well. Required a feeding tube temporarily. Still having some sensation deficits, especially left side of face but otherwise minimal residual effects. Currently taking plavix without bleeding or bruising issues.   Doing well on pravastatin, no myalgias, claudication. Tries to eat healthy diet.   Using her CPAP nightly, sleeping much better.   Getting a holter monitor with Cardiology next week for palpitations and SOB, just had a stress test done as well. Notes reviewed.   Past Medical History:  Diagnosis Date  . Allergy   . Arrhythmia   . Carpal tunnel syndrome   . Hyperlipidemia    Social History   Socioeconomic History  . Marital status: Married    Spouse name: Not on file  . Number of children: Not on file  . Years of education: Not on file  . Highest education level: Not on file  Occupational History  . Not on file  Social Needs  . Financial resource strain: Not on file  . Food insecurity:    Worry: Not on file    Inability: Not on file  . Transportation needs:    Medical: Not on file    Non-medical: Not on file  Tobacco Use  . Smoking status: Never Smoker  . Smokeless tobacco: Never Used  Substance and Sexual Activity  . Alcohol use: No    Alcohol/week: 0.0 standard drinks  . Drug use: No  . Sexual activity: Yes  Lifestyle  .  Physical activity:    Days per week: Not on file    Minutes per session: Not on file  . Stress: Not on file  Relationships  . Social connections:    Talks on phone: Not on file    Gets together: Not on file    Attends religious service: Not on file    Active member of club or organization: Not on file    Attends meetings of clubs or organizations: Not on file    Relationship status: Not on file  . Intimate partner violence:    Fear of current or ex partner: Not on file    Emotionally abused: Not on file    Physically abused: Not on file    Forced sexual activity: Not on file  Other Topics Concern  . Not on file  Social History Narrative  . Not on file   Relevant past medical, surgical, family and social history reviewed and updated as indicated. Interim medical history since our last visit reviewed. Allergies and medications reviewed and updated.  Review of Systems  Per HPI unless specifically indicated above     Objective:    BP 123/82   Pulse 86   Wt 191 lb (86.6 kg)   LMP  (LMP Unknown)  SpO2 98%   BMI 36.09 kg/m   Wt Readings from Last 3 Encounters:  05/14/18 191 lb (86.6 kg)  05/01/18 191 lb 12 oz (87 kg)  12/10/17 177 lb (80.3 kg)    Physical Exam  Constitutional: She is oriented to person, place, and time. She appears well-developed and well-nourished. No distress.  HENT:  Head: Atraumatic.  Right Ear: External ear normal.  Left Ear: External ear normal.  Nose: Nose normal.  Mouth/Throat: Oropharynx is clear and moist. No oropharyngeal exudate.  Eyes: Pupils are equal, round, and reactive to light. Conjunctivae are normal.  Neck: Normal range of motion. Neck supple.  Cardiovascular: Normal rate, normal heart sounds and intact distal pulses.  Pulmonary/Chest: Effort normal and breath sounds normal. No respiratory distress.  Abdominal: Soft. Bowel sounds are normal. She exhibits no mass. There is no tenderness.  Musculoskeletal: Normal range of motion.  She exhibits no edema or tenderness.  Neurological: She is alert and oriented to person, place, and time. A sensory deficit (mild sensation changes left face) is present.  Skin: Skin is warm and dry. No rash noted.  Psychiatric: She has a normal mood and affect. Her behavior is normal.  Nursing note and vitals reviewed.   Results for orders placed or performed in visit on 05/14/18  CBC with Differential/Platelet  Result Value Ref Range   WBC 9.2 3.4 - 10.8 x10E3/uL   RBC 4.51 3.77 - 5.28 x10E6/uL   Hemoglobin 14.2 11.1 - 15.9 g/dL   Hematocrit 39.6 34.0 - 46.6 %   MCV 88 79 - 97 fL   MCH 31.5 26.6 - 33.0 pg   MCHC 35.9 (H) 31.5 - 35.7 g/dL   RDW 13.5 12.3 - 15.4 %   Platelets 248 150 - 450 x10E3/uL   Neutrophils 61 Not Estab. %   Lymphs 25 Not Estab. %   Monocytes 9 Not Estab. %   Eos 4 Not Estab. %   Basos 1 Not Estab. %   Neutrophils Absolute 5.6 1.4 - 7.0 x10E3/uL   Lymphocytes Absolute 2.3 0.7 - 3.1 x10E3/uL   Monocytes Absolute 0.8 0.1 - 0.9 x10E3/uL   EOS (ABSOLUTE) 0.4 0.0 - 0.4 x10E3/uL   Basophils Absolute 0.1 0.0 - 0.2 x10E3/uL   Immature Granulocytes 0 Not Estab. %   Immature Grans (Abs) 0.0 0.0 - 0.1 x10E3/uL  Comprehensive metabolic panel  Result Value Ref Range   Glucose 96 65 - 99 mg/dL   BUN 9 8 - 27 mg/dL   Creatinine, Ser 1.02 (H) 0.57 - 1.00 mg/dL   GFR calc non Af Amer 57 (L) >59 mL/min/1.73   GFR calc Af Amer 65 >59 mL/min/1.73   BUN/Creatinine Ratio 9 (L) 12 - 28   Sodium 145 (H) 134 - 144 mmol/L   Potassium 4.5 3.5 - 5.2 mmol/L   Chloride 106 96 - 106 mmol/L   CO2 23 20 - 29 mmol/L   Calcium 9.4 8.7 - 10.3 mg/dL   Total Protein 6.4 6.0 - 8.5 g/dL   Albumin 4.3 3.6 - 4.8 g/dL   Globulin, Total 2.1 1.5 - 4.5 g/dL   Albumin/Globulin Ratio 2.0 1.2 - 2.2   Bilirubin Total 0.4 0.0 - 1.2 mg/dL   Alkaline Phosphatase 60 39 - 117 IU/L   AST 15 0 - 40 IU/L   ALT 18 0 - 32 IU/L  Lipid Panel w/o Chol/HDL Ratio  Result Value Ref Range   Cholesterol,  Total 185 100 - 199 mg/dL   Triglycerides 148  0 - 149 mg/dL   HDL 61 >39 mg/dL   VLDL Cholesterol Cal 30 5 - 40 mg/dL   LDL Calculated 94 0 - 99 mg/dL      Assessment & Plan:   Problem List Items Addressed This Visit      Respiratory   Sleep apnea    Stable on CPAP. Continue nightly use        Other   Hyperlipidemia - Primary    Check lipids and adjust if needed. Continue lifestyle modifications      Relevant Orders   Comprehensive metabolic panel (Completed)   Lipid Panel w/o Chol/HDL Ratio (Completed)   History of CVA (cerebrovascular accident)    Following with Neurology, completed speech therapy. Continue plavix and good lipid control. BPs remain WNL without intervention      Swallowing dysfunction    Resolved with speech therapy. Now tolerating PO well.        Other Visit Diagnoses    Encounter for monitoring antiplatelet therapy       Relevant Orders   CBC with Differential/Platelet (Completed)   Screening for breast cancer       Relevant Orders   MM DIGITAL SCREENING BILATERAL   Palpitations       Awaiting further Cardiology workup, getting holter monitor next week. Continue per their instructions       Follow up plan: Return in about 6 months (around 11/14/2018) for CPE.

## 2018-05-15 LAB — COMPREHENSIVE METABOLIC PANEL
A/G RATIO: 2 (ref 1.2–2.2)
ALK PHOS: 60 IU/L (ref 39–117)
ALT: 18 IU/L (ref 0–32)
AST: 15 IU/L (ref 0–40)
Albumin: 4.3 g/dL (ref 3.6–4.8)
BUN/Creatinine Ratio: 9 — ABNORMAL LOW (ref 12–28)
BUN: 9 mg/dL (ref 8–27)
Bilirubin Total: 0.4 mg/dL (ref 0.0–1.2)
CO2: 23 mmol/L (ref 20–29)
CREATININE: 1.02 mg/dL — AB (ref 0.57–1.00)
Calcium: 9.4 mg/dL (ref 8.7–10.3)
Chloride: 106 mmol/L (ref 96–106)
GFR calc Af Amer: 65 mL/min/{1.73_m2} (ref >59)
GFR calc non Af Amer: 57 mL/min/{1.73_m2} — ABNORMAL LOW (ref >59)
GLOBULIN, TOTAL: 2.1 g/dL (ref 1.5–4.5)
Glucose: 96 mg/dL (ref 65–99)
POTASSIUM: 4.5 mmol/L (ref 3.5–5.2)
SODIUM: 145 mmol/L — AB (ref 134–144)
Total Protein: 6.4 g/dL (ref 6.0–8.5)

## 2018-05-15 LAB — CBC WITH DIFFERENTIAL/PLATELET
Basophils Absolute: 0.1 10*3/uL (ref 0.0–0.2)
Basos: 1 %
EOS (ABSOLUTE): 0.4 10*3/uL (ref 0.0–0.4)
EOS: 4 %
HEMATOCRIT: 39.6 % (ref 34.0–46.6)
Hemoglobin: 14.2 g/dL (ref 11.1–15.9)
Immature Grans (Abs): 0 10*3/uL (ref 0.0–0.1)
Immature Granulocytes: 0 %
Lymphocytes Absolute: 2.3 10*3/uL (ref 0.7–3.1)
Lymphs: 25 %
MCH: 31.5 pg (ref 26.6–33.0)
MCHC: 35.9 g/dL — AB (ref 31.5–35.7)
MCV: 88 fL (ref 79–97)
Monocytes Absolute: 0.8 10*3/uL (ref 0.1–0.9)
Monocytes: 9 %
NEUTROS ABS: 5.6 10*3/uL (ref 1.4–7.0)
Neutrophils: 61 %
Platelets: 248 10*3/uL (ref 150–450)
RBC: 4.51 x10E6/uL (ref 3.77–5.28)
RDW: 13.5 % (ref 12.3–15.4)
WBC: 9.2 10*3/uL (ref 3.4–10.8)

## 2018-05-15 LAB — LIPID PANEL W/O CHOL/HDL RATIO
CHOLESTEROL TOTAL: 185 mg/dL (ref 100–199)
HDL: 61 mg/dL (ref >39)
LDL Calculated: 94 mg/dL (ref 0–99)
TRIGLYCERIDES: 148 mg/dL (ref 0–149)
VLDL CHOLESTEROL CAL: 30 mg/dL (ref 5–40)

## 2018-05-17 ENCOUNTER — Ambulatory Visit (INDEPENDENT_AMBULATORY_CARE_PROVIDER_SITE_OTHER): Payer: Medicare Other

## 2018-05-17 ENCOUNTER — Encounter: Payer: Self-pay | Admitting: Family Medicine

## 2018-05-17 DIAGNOSIS — R0602 Shortness of breath: Secondary | ICD-10-CM

## 2018-05-17 DIAGNOSIS — R9431 Abnormal electrocardiogram [ECG] [EKG]: Secondary | ICD-10-CM

## 2018-05-17 NOTE — Assessment & Plan Note (Signed)
Stable on CPAP. Continue nightly use

## 2018-05-17 NOTE — Assessment & Plan Note (Signed)
Resolved with speech therapy. Now tolerating PO well.

## 2018-05-17 NOTE — Assessment & Plan Note (Addendum)
Following with Neurology, completed speech therapy. Continue plavix and good lipid control. BPs remain WNL without intervention

## 2018-05-17 NOTE — Assessment & Plan Note (Signed)
Check lipids and adjust if needed. Continue lifestyle modifications

## 2018-05-23 ENCOUNTER — Other Ambulatory Visit: Payer: Self-pay

## 2018-05-23 ENCOUNTER — Other Ambulatory Visit: Payer: Self-pay | Admitting: Unknown Physician Specialty

## 2018-05-23 MED ORDER — CLOPIDOGREL BISULFATE 75 MG PO TABS: 75.0000 mg | ORAL_TABLET | Freq: Every day | ORAL | 0 refills | Status: DC

## 2018-06-17 ENCOUNTER — Ambulatory Visit
Admission: RE | Admit: 2018-06-17 | Discharge: 2018-06-17 | Disposition: A | Discharge status: Home / Self Care | Payer: Medicare Other | Source: Ambulatory Visit | Attending: Family Medicine | Admitting: Family Medicine

## 2018-06-17 DIAGNOSIS — Z1239 Encounter for other screening for malignant neoplasm of breast: Secondary | ICD-10-CM

## 2018-06-17 DIAGNOSIS — Z1231 Encounter for screening mammogram for malignant neoplasm of breast: Secondary | ICD-10-CM | POA: Insufficient documentation

## 2018-07-21 ENCOUNTER — Ambulatory Visit (INDEPENDENT_AMBULATORY_CARE_PROVIDER_SITE_OTHER): Payer: Medicare Other

## 2018-07-21 VITALS — BP 120/74 | HR 68 | Temp 97.6°F | Resp 16 | Ht 61.0 in | Wt 186.2 lb

## 2018-07-21 DIAGNOSIS — Z0000 Encounter for general adult medical examination without abnormal findings: Secondary | ICD-10-CM | POA: Diagnosis not present

## 2018-07-21 NOTE — Progress Notes (Signed)
Subjective:   Bethany Lara is a 67 y.o. female who presents for Medicare Annual (Subsequent) preventive examination.  Review of Systems:   At time of visit computer system down, notes transcribed and transferred to [place].   Cardiac Risk Factors include: advanced age (>59men, >36 women);dyslipidemia;obesity (BMI >30kg/m2)     Objective:     Vitals: BP 120/74   Pulse 68   Temp 97.6 F (36.4 C) (Oral)   Resp 16   Ht 5\' 1"  (1.549 m)   Wt 186 lb 3.2 oz (84.5 kg)   LMP  (LMP Unknown)   BMI 35.18 kg/m   Body mass index is 35.18 kg/m.  Advanced Directives 07/21/2018 10/30/2017 10/26/2017 10/26/2017 07/18/2017 07/17/2017 05/20/2017  Does Patient Have a Medical Advance Directive? Yes - Yes Yes Yes Yes Yes  Type of Paramedic of [place];Living will [place];Living will [place];Living will - [place];Living will [place];Living will Living will  Does patient want to make changes to medical advance directive? - - No - Patient declined - No - Patient declined - No - Patient declined  Copy of [place] in Chart? No - copy requested No - copy requested No - copy requested - No - copy requested No - copy requested -    Tobacco Social History   Tobacco Use  Smoking Status Never Smoker  Smokeless Tobacco Never Used     Counseling given: Not Answered   Clinical Intake:  Pre-visit preparation completed: Yes  Pain : 0-10 Pain Score: 2  Pain Type: Acute pain Pain Location: Toe (Comment which one)(pinky toe) Pain Descriptors / Indicators: Discomfort, Other (Comment)     Nutritional Status: BMI > 30  Obese Nutritional Risks: None Diabetes: No  How often do you need to have someone help you when you read instructions, pamphlets, or other written materials from your doctor or pharmacy?: 1 - Never What is the last grade level you completed in school?: 12th  grade  Interpreter Needed?: No  Information entered by :: Vernice Jefferson  Past Medical History:  Diagnosis Date  . Allergy   . Arrhythmia   . Carpal tunnel syndrome   . Hyperlipidemia    Past Surgical History:  Procedure Laterality Date  . BUNIONECTOMY    . CARPAL TUNNEL RELEASE  11/14/2015  . CHOLECYSTECTOMY    . PEG PLACEMENT N/A 10/30/2017   Procedure: PERCUTANEOUS ENDOSCOPIC GASTROSTOMY (PEG) PLACEMENT;  Surgeon: Lin Landsman, MD;  Location: [facility] ENDOSCOPY;  Service: Gastroenterology;  Laterality: N/A;   Family History  Problem Relation Age of Onset  . Diabetes Mother   . Hypertension Mother   . Dementia Mother   . Heart murmur Mother   . Diabetes Father   . Heart disease Father   . Alzheimer's disease Father   . Glaucoma Father   . Colon cancer Father   . Heart attack Father   . Breast cancer Maternal Aunt    Social History   Socioeconomic History  . Marital status: Married    Spouse name: Not on file  . Number of children: Not on file  . Years of education: 12th grade  . Highest education level: Not on file  Occupational History  . 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 Financial resource strain: Not hard at all  . Food insecurity:    Worry: Never true    Inability: Never  true  . Transportation needs:    Medical: No    Non-medical: No  Tobacco Use  . Smoking status: Never Smoker  . Smokeless tobacco: Never Used  Substance and Sexual Activity  . Alcohol use: No    Alcohol/week: 0.0 standard drinks  . Drug use: No  . Sexual activity: Yes  Lifestyle  . Physical activity:    Days per week: 0 days    Minutes per session: 0 min  . Stress: Patient refused  Relationships  . Social connections:    Talks on phone: More than three times a week    Gets together: More than three times a week    Attends religious service: Patient refused    Active member of club or organization: Patient refused    Attends meetings  of clubs or organizations: Patient refused    Relationship status: Married  Other Topics Concern  . Not on file  Social History Narrative  . Not on file    Outpatient Encounter Medications as of 07/21/2018  Medication Sig  . cetirizine (ZYRTEC) 10 MG tablet Place 1 tablet (10 mg total) into feeding tube daily. (Patient taking differently: Take 10 mg by mouth daily. )  . clopidogrel (PLAVIX) 75 MG tablet Take 1 tablet (75 mg total) by mouth daily.  . fenofibrate 160 MG tablet Take 1 tablet (160 mg total) by mouth daily.  . fluticasone (FLONASE) 50 MCG/ACT nasal spray Place 2 sprays into both nostrils 2 (two) times daily.  . Multiple Vitamin (MULTIVITAMIN) tablet Place 1 tablet into feeding tube daily. (Patient taking differently: Take 1 tablet by mouth daily. )  . NON FORMULARY CPAP @@ bedtime.  . pravastatin (PRAVACHOL) 40 MG tablet Place 1 tablet (40 mg total) into feeding tube at bedtime. (Patient taking differently: Take 40 mg by mouth at bedtime. )  . Vitamin D, Cholecalciferol, 1000 units TABS Give 2,000 Units by tube daily. (Patient taking differently: Take 2,000 Units by mouth daily. )   No facility-administered encounter medications on file as of 07/21/2018.     Activities of Daily Living In your present state of health, do you have any difficulty performing the following activities: 07/21/2018 10/26/2017  Hearing? N -  Comment declines hearing aids -  Vision? N -  Comment wears glasses -  Difficulty concentrating or making decisions? N -  Walking or climbing stairs? N -  Dressing or bathing? N -  Doing errands, shopping? N N  Preparing Food and eating ? N -  Using the Toilet? N -  In the past six months, have you accidently leaked urine? N -  Do you have problems with loss of bowel control? N -  Managing your Medications? N -  Housekeeping or managing your Housekeeping? N -  Some recent data might be hidden    Patient Care Team: Volney American, PA-C as PCP -  General (Family Medicine) Kathrine Haddock, NP as PCP - Family Medicine (Nurse Practitioner)     Assessment:   This is a routine wellness examination for [place].  Exercise Activities and Dietary recommendations Current Exercise Habits: Home exercise routine, Type of exercise: walking, Time (Minutes): 30, Frequency (Times/Week): 3, Weekly Exercise (Minutes/Week): 90, Intensity: Mild  Goals    . Increase water intake     Recommend drinking at least 7-8 glasses of water a day        Fall Risk Fall Risk  07/21/2018 05/14/2018 07/17/2017 07/23/2016 07/22/2015  Falls in the past year? Yes No No  No No  Number falls in past yr: 1 - - - -  Injury with Fall? Yes - - - -  Follow up Falls evaluation completed;Falls prevention discussed - - - -   FALL RISK PREVENTION PERTAINING TO THE HOME:  Any stairs in or around the home WITH handrails? Yes  Home free of loose throw rugs in walkways, pet beds, electrical cords, etc? No  Adequate lighting in your home to reduce risk of falls? Yes   ASSISTIVE DEVICES UTILIZED TO PREVENT FALLS:  Life alert? No  Use of a cane, walker or w/c? No  Grab bars in the bathroom? Yes  Shower chair or bench in shower? No  Elevated toilet seat or a handicapped toilet? No   DME ORDERS:  DME order needed?  No   TIMED UP AND GO:  Was the test performed? Yes .  Length of time to ambulate 10 feet: 8 sec.   GAIT:  Appearance of gait: Gait stead-fast and without the use of an assistive device.  Education: Fall risk prevention has been discussed.  Intervention(s) required? No     Depression Screen PHQ 2/9 Scores 07/21/2018 05/14/2018 07/17/2017 07/23/2016  PHQ - 2 Score 0 0 0 2  PHQ- 9 Score - - 1 -     Cognitive Function     6CIT Screen 07/21/2018 07/17/2017  What Year? 0 points 0 points  What month? 0 points 0 points  What time? 0 points 0 points  Count back from 20 0 points 0 points  Months in reverse 0 points 0 points  Repeat phrase 2 points 2  points  Total Score 2 2    Immunization History  Administered Date(s) Administered  . Influenza, High Dose Seasonal PF 07/23/2016  . Influenza,inj,Quad PF,6+ Mos 11/28/2015  . Pneumococcal Conjugate-13 07/13/2014  . Pneumococcal Polysaccharide-23 01/20/2016  . Td 11/20/2005  . Zoster 05/09/2010    Qualifies for Shingles Vaccine?Yes  Zostavax completed 05/09/10. Due for Shingrix. Education has been provided regarding the importance of this vaccine. Pt has been advised to call insurance company to determine out of pocket expense. Advised may also receive vaccine at local pharmacy or Health Dept. Verbalized acceptance and understanding.  Tdap: Although this vaccine is not a covered service during a Wellness Exam, does the patient still wish to receive this vaccine today?  No .  Education has been provided regarding the importance of this vaccine. Advised may receive this vaccine at local pharmacy or Health Dept. Aware to provide a copy of the vaccination record if obtained from local pharmacy or Health Dept. Verbalized acceptance and understanding.  Flu Vaccine: Due for Flu vaccine. Does the patient want to receive this vaccine today?  No . Education has been provided regarding the importance of this vaccine but still declined. Advised may receive this vaccine at local pharmacy or Health Dept. Aware to provide a copy of the vaccination record if obtained from local pharmacy or Health Dept. Verbalized acceptance and understanding.  Pneumococcal Vaccine: completed series 07/23/2016   Screening Tests Health Maintenance  Topic Date Due  . Fecal DNA (Cologuard)  10/25/2018 (Originally 05/16/1999)  . INFLUENZA VACCINE  05/26/2019 (Originally 04/24/2018)  . TETANUS/TDAP  07/22/2019 (Originally 11/21/2015)  . MAMMOGRAM  06/18/2019  . DEXA SCAN  Completed  . Hepatitis C Screening  Completed  . PNA vac Low Risk Adult  Completed    Cancer Screenings:  Colorectal Screening: due for screening, pt  previously had cologuard ordered in 2017 but did not  complete. Discovered incomplete after visit completed due to computer system unavailable at time of visit.   Mammogram: Completed 06/17/18.   Bone Density: Completed 08/30/14. Results reflect NORMAL,   Lung Cancer Screening: (Low Dose CT Chest recommended if Age 31-80 years, 30 pack-year currently smoking OR have quit w/in 15years.) does not qualify.    Additional Screening:  Hepatitis C Screening: does qualify; Completed 07/22/15  Vision Screening: Recommended annual ophthalmology exams for early detection of glaucoma and other disorders of the eye. Is the patient up to date with their annual eye exam?  Yes  Who is the provider or what is the name of the office in which the pt attends annual eye exams? Dr. [facility]  Dental Screening: Recommended annual dental exams for proper oral hygiene  Community Resource Referral:  CRR required this visit?  No      Plan     I have personally reviewed and addressed the Medicare Annual Wellness questionnaire and have noted the following in the patient's chart:  A. Medical and social history B. Use of alcohol, tobacco or illicit drugs  C. Current medications and supplements D. Functional ability and status E.  Nutritional status F.  Physical activity G. Advance directives H. List of other physicians I.  Hospitalizations, surgeries, and ER visits in previous 12 months J.  [place] such as hearing and vision if needed, cognitive and depression L. Referrals and appointments   In addition, I have reviewed and discussed with patient certain preventive protocols, quality metrics, and best practice recommendations. A written personalized care plan for preventive services as well as general preventive health recommendations were provided to patient.   Signed,  Clemetine Marker, LPN Nurse Health Advisor   Nurse Notes:  Patient c/o acute intermittent right ear pain  and requested appointment prior to CPE in February. Unable to schedule appt at time of visit due to computer system down, requested front desk to contact patient to notify her of upcoming appt.

## 2018-07-21 NOTE — Patient Instructions (Signed)
Bethany Lara , Thank you for taking time to come for your Medicare Wellness Visit. I appreciate your ongoing commitment to your health goals. Please review the following plan we discussed and let me know if I can assist you in the future.   Screening recommendations/referrals: Colonoscopy: DUE Mammogram: Up to date Bone Density: Up to date Recommended yearly ophthalmology/optometry visit for glaucoma screening and checkup: Up to date Recommended yearly dental visit for hygiene and checkup  Vaccinations: Influenza vaccine: decline Pneumococcal vaccine: Up to date Tdap vaccine: due, check with insurance company for coverage Shingles vaccine: Zoster completed. Discussed Shingarix, check with pharmacy for coverage Advanced directives: Please bring a copy of your health care power of attorney and living will to the office at your convenience.   Conditions/risks identified:   Next appointment: 11/14/18 8:00 [place] 65 Years and Older, Female Preventive care refers to lifestyle choices and visits with your health care provider that can promote health and wellness. What does preventive care include?  A yearly physical exam. This is also called an annual well check.  Dental exams once or twice a year.  Routine eye exams. Ask your health care provider how often you should have your eyes checked.  Personal lifestyle choices, including:  Daily care of your teeth and gums.  Regular physical activity.  Eating a healthy diet.  Avoiding tobacco and drug use.  Limiting alcohol use.  Practicing safe sex.  Taking low-dose aspirin every day.  Taking vitamin and mineral supplements as recommended by your health care provider. What happens during an annual well check? The services and screenings done by your health care provider during your annual well check will depend on your age, overall health, lifestyle risk factors, and family history of disease. Counseling    Your health care provider may ask you questions about your:  Alcohol use.  Tobacco use.  Drug use.  Emotional well-being.  Home and relationship well-being.  Sexual activity.  Eating habits.  History of falls.  Memory and ability to understand (cognition).  Work and work Statistician.  Reproductive health. Screening  You may have the following tests or measurements:  Height, weight, and BMI.  Blood pressure.  Lipid and cholesterol levels. These may be checked every 5 years, or more frequently if you are over 34 years old.  Skin check.  Lung cancer screening. You may have this screening every year starting at age 74 if you have a 30-pack-year history of smoking and currently smoke or have quit within the past 15 years.  Fecal occult blood test (FOBT) of the stool. You may have this test every year starting at age 90.  Flexible sigmoidoscopy or colonoscopy. You may have a sigmoidoscopy every 5 years or a colonoscopy every 10 years starting at age 50.  Hepatitis C blood test.  Hepatitis B blood test.  Sexually transmitted disease (STD) testing.  Diabetes screening. This is done by checking your blood sugar (glucose) after you have not eaten for a while (fasting). You may have this done every 1-3 years.  Bone density scan. This is done to screen for osteoporosis. You may have this done starting at age 59.  Mammogram. This may be done every 1-2 years. Talk to your health care provider about how often you should have regular mammograms. Talk with your health care provider about your test results, treatment options, and if necessary, the need for more tests. Vaccines  Your health care provider may recommend certain vaccines,  such as:  Influenza vaccine. This is recommended every year.  Tetanus, diphtheria, and acellular pertussis (Tdap, Td) vaccine. You may need a Td booster every 10 years.  Zoster vaccine. You may need this after age 37.  Pneumococcal 13-valent  conjugate (PCV13) vaccine. One dose is recommended after age 15.  Pneumococcal polysaccharide (PPSV23) vaccine. One dose is recommended after age 62. Talk to your health care provider about which screenings and vaccines you need and how often you need them. This information is not intended to replace advice given to you by your health care provider. Make sure you discuss any questions you have with your health care provider. Document Released: 10/07/2015 Document Revised: 05/30/2016 Document Reviewed: 07/12/2015 Elsevier Interactive Patient Education  2017 [place] Prevention in the Home Falls can cause injuries. They can happen to people of all ages. There are many things you can do to make your home safe and to help prevent falls. What can I do on the outside of my home?  Regularly fix the edges of walkways and driveways and fix any cracks.  Remove anything that might make you trip as you walk through a door, such as a raised step or threshold.  Trim any bushes or trees on the path to your home.  Use bright outdoor lighting.  Clear any walking paths of anything that might make someone trip, such as rocks or tools.  Regularly check to see if handrails are loose or broken. Make sure that both sides of any steps have handrails.  Any raised decks and porches should have guardrails on the edges.  Have any leaves, snow, or ice cleared regularly.  Use sand or salt on walking paths during winter.  Clean up any spills in your garage right away. This includes oil or grease spills. What can I do in the bathroom?  Use night lights.  Install grab bars by the toilet and in the tub and shower. Do not use towel bars as grab bars.  Use non-skid mats or decals in the tub or shower.  If you need to sit down in the shower, use a plastic, non-slip stool.  Keep the floor dry. Clean up any water that spills on the floor as soon as it happens.  Remove soap buildup in the tub or  shower regularly.  Attach bath mats securely with double-sided non-slip rug tape.  Do not have throw rugs and other things on the floor that can make you trip. What can I do in the bedroom?  Use night lights.  Make sure that you have a light by your bed that is easy to reach.  Do not use any sheets or blankets that are too big for your bed. They should not hang down onto the floor.  Have a firm chair that has side arms. You can use this for support while you get dressed.  Do not have throw rugs and other things on the floor that can make you trip. What can I do in the kitchen?  Clean up any spills right away.  Avoid walking on wet floors.  Keep items that you use a lot in easy-to-reach places.  If you need to reach something above you, use a strong step stool that has a grab bar.  Keep electrical cords out of the way.  Do not use floor polish or wax that makes floors slippery. If you must use wax, use non-skid floor wax.  Do not have throw rugs and other things on  the floor that can make you trip. What can I do with my stairs?  Do not leave any items on the stairs.  Make sure that there are handrails on both sides of the stairs and use them. Fix handrails that are broken or loose. Make sure that handrails are as long as the stairways.  Check any carpeting to make sure that it is firmly attached to the stairs. Fix any carpet that is loose or worn.  Avoid having throw rugs at the top or bottom of the stairs. If you do have throw rugs, attach them to the floor with carpet tape.  Make sure that you have a light switch at the top of the stairs and the bottom of the stairs. If you do not have them, ask someone to add them for you. What else can I do to help prevent falls?  Wear shoes that:  Do not have high heels.  Have rubber bottoms.  Are comfortable and fit you well.  Are closed at the toe. Do not wear sandals.  If you use a stepladder:  Make sure that it is fully  opened. Do not climb a closed stepladder.  Make sure that both sides of the stepladder are locked into place.  Ask someone to hold it for you, if possible.  Clearly mark and make sure that you can see:  Any grab bars or handrails.  First and last steps.  Where the edge of each step is.  Use tools that help you move around (mobility aids) if they are needed. These include:  Canes.  Walkers.  Scooters.  Crutches.  Turn on the lights when you go into a dark area. Replace any light bulbs as soon as they burn out.  Set up your furniture so you have a clear path. Avoid moving your furniture around.  If any of your floors are uneven, fix them.  If there are any pets around you, be aware of where they are.  Review your medicines with your doctor. Some medicines can make you feel dizzy. This can increase your chance of falling. Ask your doctor what other things that you can do to help prevent falls. This information is not intended to replace advice given to you by your health care provider. Make sure you discuss any questions you have with your health care provider. Document Released: 07/07/2009 Document Revised: 02/16/2016 Document Reviewed: 10/15/2014 Elsevier Interactive Patient Education  2017 [employer].

## 2018-07-22 ENCOUNTER — Other Ambulatory Visit: Payer: Self-pay

## 2018-07-22 ENCOUNTER — Encounter: Payer: Self-pay | Admitting: Family Medicine

## 2018-07-22 ENCOUNTER — Ambulatory Visit (INDEPENDENT_AMBULATORY_CARE_PROVIDER_SITE_OTHER): Payer: Medicare Other | Admitting: Family Medicine

## 2018-07-22 VITALS — BP 122/75 | HR 62 | Temp 97.9°F | Ht 61.0 in | Wt 186.7 lb

## 2018-07-22 DIAGNOSIS — J309 Allergic rhinitis, unspecified: Secondary | ICD-10-CM

## 2018-07-22 MED ORDER — CLOPIDOGREL BISULFATE 75 MG PO TABS: 75.0000 mg | ORAL_TABLET | Freq: Every day | ORAL | 1 refills | Status: DC

## 2018-07-22 NOTE — Assessment & Plan Note (Signed)
Increase zyrtec and flonase to twice daily, reviewed proper use of flonase as pt currently pointing straight up the nose and most of it running down throat. Will have her check in in 2 weeks via phone or mychart, if not better will add singulair.

## 2018-07-22 NOTE — Progress Notes (Signed)
BP 122/75 (BP Location: Right Arm, Patient Position: Sitting, Cuff Size: Normal)   Pulse 62   Temp 97.9 F (36.6 C) (Oral)   Ht 5\' 1"  (1.549 m)   Wt 186 lb 11.2 oz (84.7 kg)   LMP  (LMP Unknown)   SpO2 99%   BMI 35.28 kg/m    Subjective:    Patient ID: Bethany Lara, female    DOB: 10-28-1950, 67 y.o.   MRN: [id number]  HPI: Bethany Lara is a 67 y.o. female  Chief Complaint  Patient presents with  . Ear Pain    Some pain, feeling of pressure and being stopped up. Ongoing a couple months patient states.   Right ear pressure and fullness with a slight dull pain intermittently x several months. Taking zyrtec and flonase once daily with no relief. Some intermittent congestion in the mornings but no other sxs. Denies fevers, chills, cough, Cp, SOB. Hx of allergic rhinitis.   Relevant past medical, surgical, family and social history reviewed and updated as indicated. Interim medical history since our last visit reviewed. Allergies and medications reviewed and updated.  Review of Systems  Per HPI unless specifically indicated above     Objective:    BP 122/75 (BP Location: Right Arm, Patient Position: Sitting, Cuff Size: Normal)   Pulse 62   Temp 97.9 F (36.6 C) (Oral)   Ht 5\' 1"  (1.549 m)   Wt 186 lb 11.2 oz (84.7 kg)   LMP  (LMP Unknown)   SpO2 99%   BMI 35.28 kg/m   Wt Readings from Last 3 Encounters:  07/22/18 186 lb 11.2 oz (84.7 kg)  07/21/18 186 lb 3.2 oz (84.5 kg)  05/14/18 191 lb (86.6 kg)    Physical Exam  Constitutional: She is oriented to person, place, and time. She appears well-developed and well-nourished. No distress.  HENT:  Head: Atraumatic.  Nasal mucosa boggy and injected Oropharynx erythematous posteriorly B/l mild middle ear effusion  Eyes: Conjunctivae and EOM are normal.  Neck: Normal range of motion. Neck supple.  Cardiovascular: Normal rate and regular rhythm.  Pulmonary/Chest: Effort normal and breath sounds normal.    Musculoskeletal: Normal range of motion.  Neurological: She is alert and oriented to person, place, and time.  Skin: Skin is warm and dry.  Psychiatric: She has a normal mood and affect. Her behavior is normal.  Nursing note and vitals reviewed.   Results for orders placed or performed in visit on 05/14/18  CBC with Differential/Platelet  Result Value Ref Range   WBC 9.2 3.4 - 10.8 x10E3/uL   RBC 4.51 3.77 - 5.28 x10E6/uL   Hemoglobin 14.2 11.1 - 15.9 g/dL   Hematocrit 39.6 34.0 - 46.6 %   MCV 88 79 - 97 fL   MCH 31.5 26.6 - 33.0 pg   MCHC 35.9 (H) 31.5 - 35.7 g/dL   RDW 13.5 12.3 - 15.4 %   Platelets 248 150 - 450 x10E3/uL   Neutrophils 61 Not Estab. %   Lymphs 25 Not Estab. %   Monocytes 9 Not Estab. %   Eos 4 Not Estab. %   Basos 1 Not Estab. %   Neutrophils Absolute 5.6 1.4 - 7.0 x10E3/uL   Lymphocytes Absolute 2.3 0.7 - 3.1 x10E3/uL   Monocytes Absolute 0.8 0.1 - 0.9 x10E3/uL   EOS (ABSOLUTE) 0.4 0.0 - 0.4 x10E3/uL   Basophils Absolute 0.1 0.0 - 0.2 x10E3/uL   Immature Granulocytes 0 Not Estab. %   Immature Grans (  Abs) 0.0 0.0 - 0.1 x10E3/uL  Comprehensive metabolic panel  Result Value Ref Range   Glucose 96 65 - 99 mg/dL   BUN 9 8 - 27 mg/dL   Creatinine, Ser 1.02 (H) 0.57 - 1.00 mg/dL   GFR calc non Af Amer 57 (L) >59 mL/min/1.73   GFR calc Af Amer 65 >59 mL/min/1.73   BUN/Creatinine Ratio 9 (L) 12 - 28   Sodium 145 (H) 134 - 144 mmol/L   Potassium 4.5 3.5 - 5.2 mmol/L   Chloride 106 96 - 106 mmol/L   CO2 23 20 - 29 mmol/L   Calcium 9.4 8.7 - 10.3 mg/dL   Total Protein 6.4 6.0 - 8.5 g/dL   Albumin 4.3 3.6 - 4.8 g/dL   Globulin, Total 2.1 1.5 - 4.5 g/dL   Albumin/Globulin Ratio 2.0 1.2 - 2.2   Bilirubin Total 0.4 0.0 - 1.2 mg/dL   Alkaline Phosphatase 60 39 - 117 IU/L   AST 15 0 - 40 IU/L   ALT 18 0 - 32 IU/L  Lipid Panel w/o Chol/HDL Ratio  Result Value Ref Range   Cholesterol, Total 185 100 - 199 mg/dL   Triglycerides 148 0 - 149 mg/dL   HDL 61 >39  mg/dL   VLDL Cholesterol Cal 30 5 - 40 mg/dL   LDL Calculated 94 0 - 99 mg/dL      Assessment & Plan:   Problem List Items Addressed This Visit      Respiratory   Allergic rhinitis - Primary    Increase zyrtec and flonase to twice daily, reviewed proper use of flonase as pt currently pointing straight up the nose and most of it running down throat. Will have her check in in 2 weeks via phone or mychart, if not better will add singulair.           Follow up plan: Return if symptoms worsen or fail to improve.

## 2018-07-22 NOTE — Patient Instructions (Signed)
Take zyrtec and flonase twice daily.

## 2018-07-22 NOTE — Telephone Encounter (Signed)
Seen today for acute visit.

## 2018-09-08 ENCOUNTER — Other Ambulatory Visit: Payer: Self-pay | Admitting: Unknown Physician Specialty

## 2018-09-08 NOTE — Telephone Encounter (Signed)
Requested medication (s) are due for refill today: no [facility])  Requested medication (s) are on the active medication list: yes  Last refill:  12/20/17 for 90 tabs and 2 refills  Future visit scheduled: yes  Notes to clinic:  Cardiovascular: Antilipid - Fibric Acid Derivatives Failed  Requested Prescriptions  Pending Prescriptions Disp Refills   fenofibrate 160 MG tablet [Pharmacy Med Name: FENOFIBRATE  160MG  TAB] 90 tablet 2    Sig: TAKE 1 TABLET BY MOUTH  DAILY     Cardiovascular:  Antilipid - Fibric Acid Derivatives Failed - 09/08/2018  5:46 AM      Failed - Cr in normal range and within 180 days    Creatinine, Ser  Date Value Ref Range Status  05/14/2018 1.02 (H) 0.57 - 1.00 mg/dL Final         Passed - Total Cholesterol in normal range and within 360 days    Cholesterol, Total  Date Value Ref Range Status  05/14/2018 185 100 - 199 mg/dL Final   Cholesterol Piccolo, Waived  Date Value Ref Range Status  01/21/2017 184 <200 mg/dL Final    Comment:                            Desirable                <200                         Borderline High      200- 239                         High                     >239          Passed - LDL in normal range and within 360 days    LDL Calculated  Date Value Ref Range Status  05/14/2018 94 0 - 99 mg/dL Final         Passed - HDL in normal range and within 360 days    HDL  Date Value Ref Range Status  05/14/2018 61 >39 mg/dL Final         Passed - Triglycerides in normal range and within 360 days    Triglycerides  Date Value Ref Range Status  05/14/2018 148 0 - 149 mg/dL Final   Triglycerides Piccolo,Waived  Date Value Ref Range Status  01/21/2017 144 <150 mg/dL Final    Comment:                            Normal                   <150                         Borderline High     150 - 199                         High                200 - 499                         Very High                >  499           Passed - ALT in normal range and within 180 days    ALT  Date Value Ref Range Status  05/14/2018 18 0 - 32 IU/L Final         Passed - AST in normal range and within 180 days    AST  Date Value Ref Range Status  05/14/2018 15 0 - 40 IU/L Final         Passed - eGFR in normal range and within 180 days    GFR calc Af Amer  Date Value Ref Range Status  05/14/2018 65 >59 mL/min/1.73 Final   GFR calc non Af Amer  Date Value Ref Range Status  05/14/2018 57 (L) >59 mL/min/1.73 Final         Passed - Valid encounter within last 12 months    Recent Outpatient Visits          1 month ago Allergic rhinitis, unspecified seasonality, unspecified trigger   [facility], [place]   3 months ago Other hyperlipidemia   [place], [place], [place]   9 months ago Swallowing dysfunction   [facility] Kathrine Haddock, NP   10 months ago IFG (impaired fasting glucose)   [facility] Kathrine Haddock, NP   1 year ago Acute non-recurrent maxillary sinusitis   [facility] Kathrine Haddock, NP      Future Appointments            In 2 months Orene Desanctis, Lilia Argue, PA-C [facility], [place]

## 2018-10-15 ENCOUNTER — Ambulatory Visit (INDEPENDENT_AMBULATORY_CARE_PROVIDER_SITE_OTHER): Payer: Medicare Other | Admitting: Family Medicine

## 2018-10-15 ENCOUNTER — Encounter: Payer: Self-pay | Admitting: Family Medicine

## 2018-10-15 VITALS — BP 123/79 | HR 77 | Temp 98.5°F | Ht 61.0 in | Wt 189.0 lb

## 2018-10-15 DIAGNOSIS — J069 Acute upper respiratory infection, unspecified: Secondary | ICD-10-CM

## 2018-10-15 DIAGNOSIS — B9789 Other viral agents as the cause of diseases classified elsewhere: Secondary | ICD-10-CM

## 2018-10-15 MED ORDER — HYDROCOD POLST-CPM POLST ER 10-8 MG/5ML PO SUER: 5.0000 mL | Freq: Two times a day (BID) | ORAL | 0 refills | Status: DC | PRN

## 2018-10-15 MED ORDER — BENZONATATE 200 MG PO CAPS: 200.0000 mg | ORAL_CAPSULE | Freq: Three times a day (TID) | ORAL | 0 refills | Status: DC | PRN

## 2018-10-15 NOTE — Progress Notes (Signed)
BP 123/79   Pulse 77   Temp 98.5 F (36.9 C) (Oral)   Ht 5\' 1"  (1.549 m)   Wt 189 lb (85.7 kg)   LMP  (LMP Unknown)   SpO2 96%   BMI 35.71 kg/m    Subjective:    Patient ID: Bethany Lara, female    DOB: 12-08-1950, 68 y.o.   MRN: [id number]  HPI: Bethany Lara is a 68 y.o. female  Chief Complaint  Patient presents with  . Sore Throat  . Cough   Sore throat, cough, and HA since 4 am this morning. Took some tylenol this morning which helped with the sore throat. Otherwise has not tried anything for sxs. Denies Cp, SOB, wheezing, N/V, fevers, body aches. Several sick contacts in the home.   Relevant past medical, surgical, family and social history reviewed and updated as indicated. Interim medical history since our last visit reviewed. Allergies and medications reviewed and updated.  Review of Systems  Per HPI unless specifically indicated above     Objective:    BP 123/79   Pulse 77   Temp 98.5 F (36.9 C) (Oral)   Ht 5\' 1"  (1.549 m)   Wt 189 lb (85.7 kg)   LMP  (LMP Unknown)   SpO2 96%   BMI 35.71 kg/m   Wt Readings from Last 3 Encounters:  10/15/18 189 lb (85.7 kg)  07/22/18 186 lb 11.2 oz (84.7 kg)  07/21/18 186 lb 3.2 oz (84.5 kg)    Physical Exam Vitals signs and nursing note reviewed.  Constitutional:      Appearance: Normal appearance.  HENT:     Head: Atraumatic.     Right Ear: Tympanic membrane and external ear normal.     Left Ear: Tympanic membrane and external ear normal.     Nose: Rhinorrhea present.     Mouth/Throat:     Mouth: Mucous membranes are moist.     Pharynx: Posterior oropharyngeal erythema present.  Eyes:     Extraocular Movements: Extraocular movements intact.     Conjunctiva/sclera: Conjunctivae normal.  Neck:     Musculoskeletal: Normal range of motion and neck supple.  Cardiovascular:     Rate and Rhythm: Normal rate and regular rhythm.     Heart sounds: Normal heart sounds.  Pulmonary:     Effort:  Pulmonary effort is normal.     Breath sounds: Normal breath sounds. No wheezing or rales.  Musculoskeletal: Normal range of motion.  Skin:    General: Skin is warm and dry.  Neurological:     Mental Status: She is alert and oriented to person, place, and time.  Psychiatric:        Mood and Affect: Mood normal.        Thought Content: Thought content normal.    Results for orders placed or performed in visit on 05/14/18  CBC with Differential/Platelet  Result Value Ref Range   WBC 9.2 3.4 - 10.8 x10E3/uL   RBC 4.51 3.77 - 5.28 x10E6/uL   Hemoglobin 14.2 11.1 - 15.9 g/dL   Hematocrit 39.6 34.0 - 46.6 %   MCV 88 79 - 97 fL   MCH 31.5 26.6 - 33.0 pg   MCHC 35.9 (H) 31.5 - 35.7 g/dL   RDW 13.5 12.3 - 15.4 %   Platelets 248 150 - 450 x10E3/uL   Neutrophils 61 Not Estab. %   Lymphs 25 Not Estab. %   Monocytes 9 Not Estab. %   Eos  4 Not Estab. %   Basos 1 Not Estab. %   Neutrophils Absolute 5.6 1.4 - 7.0 x10E3/uL   Lymphocytes Absolute 2.3 0.7 - 3.1 x10E3/uL   Monocytes Absolute 0.8 0.1 - 0.9 x10E3/uL   EOS (ABSOLUTE) 0.4 0.0 - 0.4 x10E3/uL   Basophils Absolute 0.1 0.0 - 0.2 x10E3/uL   Immature Granulocytes 0 Not Estab. %   Immature Grans (Abs) 0.0 0.0 - 0.1 x10E3/uL  Comprehensive metabolic panel  Result Value Ref Range   Glucose 96 65 - 99 mg/dL   BUN 9 8 - 27 mg/dL   Creatinine, Ser 1.02 (H) 0.57 - 1.00 mg/dL   GFR calc non Af Amer 57 (L) >59 mL/min/1.73   GFR calc Af Amer 65 >59 mL/min/1.73   BUN/Creatinine Ratio 9 (L) 12 - 28   Sodium 145 (H) 134 - 144 mmol/L   Potassium 4.5 3.5 - 5.2 mmol/L   Chloride 106 96 - 106 mmol/L   CO2 23 20 - 29 mmol/L   Calcium 9.4 8.7 - 10.3 mg/dL   Total Protein 6.4 6.0 - 8.5 g/dL   Albumin 4.3 3.6 - 4.8 g/dL   Globulin, Total 2.1 1.5 - 4.5 g/dL   Albumin/Globulin Ratio 2.0 1.2 - 2.2   Bilirubin Total 0.4 0.0 - 1.2 mg/dL   Alkaline Phosphatase 60 39 - 117 IU/L   AST 15 0 - 40 IU/L   ALT 18 0 - 32 IU/L  Lipid Panel w/o Chol/HDL Ratio   Result Value Ref Range   Cholesterol, Total 185 100 - 199 mg/dL   Triglycerides 148 0 - 149 mg/dL   HDL 61 >39 mg/dL   VLDL Cholesterol Cal 30 5 - 40 mg/dL   LDL Calculated 94 0 - 99 mg/dL      Assessment & Plan:   Problem List Items Addressed This Visit    None    Visit Diagnoses    Viral URI with cough    -  Primary   Tx with tessalon, tussionex at bedtime, and supportive home care. Call if worsening or not improving       Follow up plan: Return if symptoms worsen or fail to improve.

## 2018-10-16 ENCOUNTER — Ambulatory Visit: Payer: Self-pay

## 2018-10-16 MED ORDER — PROMETHAZINE-DM 6.25-15 MG/5ML PO SYRP: 2.5000 mL | ORAL_SOLUTION | Freq: Four times a day (QID) | ORAL | 0 refills | Status: DC | PRN

## 2018-10-16 NOTE — Addendum Note (Signed)
Addended by: Merrie Roof E on: 10/16/2018 02:05 PM   Modules accepted: Orders

## 2018-10-16 NOTE — Telephone Encounter (Signed)
Message relayed to husband. Verbalized understanding and denied questions.  DPR was reviewed.

## 2018-10-16 NOTE — Telephone Encounter (Signed)
  Returned call to patient who states she was in the office yesterday for cough and nasal drainage.  She states that last night she could not sleep because of continuous cough.  She states she got out of bed and tried to sleep in the chair but woke startled because she was not wearing her Cpap. She felt she was not breathing for a moment.  She is feeling better today.  She states her chest is sore because of coughing.  She denies chest pain. She states that when she coughs her head hurts.  She has no fever. Home care advice was read to patient. Patient verbalized understanding. Pt was cautioned to ask the pharmacist before trying any OTC cold/cough medications. Pt stated that she was afraid to take the tussionex prescribed for night time.  She doesn't like to take anything with hydrocodone. Appointment was offered but pt refused. Pt states she will call back if symptoms continue. Reason for Disposition . Cough with cold symptoms (e.g., runny nose, postnasal drip, throat clearing)  Answer Assessment - Initial Assessment Questions 1. ONSET: "When did the cough begin?"      Wednesday morning 2. SEVERITY: "How bad is the cough today?"      mild 3. RESPIRATORY DISTRESS: "Describe your breathing."      Difficult last night was not using her C pap 4. FEVER: "Do you have a fever?" If so, ask: "What is your temperature, how was it measured, and when did it start?"     no 5. SPUTUM: "Describe the color of your sputum" (clear, white, yellow, green)     clear 6. HEMOPTYSIS: "Are you coughing up any blood?" If so ask: "How much?" (flecks, streaks, tablespoons, etc.)     no 7. CARDIAC HISTORY: "Do you have any history of heart disease?" (e.g., heart attack, congestive heart failure)      no 8. LUNG HISTORY: "Do you have any history of lung disease?"  (e.g., pulmonary embolus, asthma, emphysema)     Sleep apnea,  9. PE RISK FACTORS: "Do you have a history of blood clots?" (or: recent major surgery, recent  prolonged travel, bedridden)     Stroke last February 10. OTHER SYMPTOMS: "Do you have any other symptoms?" (e.g., runny nose, wheezing, chest pain)       Mild runny nose, chest is sore from cough, headache with cough 11. PREGNANCY: "Is there any chance you are pregnant?" "When was your last menstrual period?"       N/A 12. TRAVEL: "Have you traveled out of the country in the last month?" (e.g., travel history, exposures)       no  Protocols used: [place]

## 2018-10-16 NOTE — Telephone Encounter (Signed)
Sent over a non-narcotic cough syrup for bedtime that should help her sleep

## 2018-10-22 ENCOUNTER — Telehealth: Payer: Self-pay | Admitting: Family Medicine

## 2018-10-22 MED ORDER — AMOXICILLIN-POT CLAVULANATE 875-125 MG PO TABS: 1.0000 | ORAL_TABLET | Freq: Two times a day (BID) | ORAL | 0 refills | Status: DC

## 2018-10-22 NOTE — Telephone Encounter (Signed)
Copied from [place] [id number] [id number]. Topic: General - Other >> Oct 22, 2018  8:16 AM Keene Breath wrote: Reason for CRM: Patient called to inform the doctor that she is still not feeling well.  Patient states that she is congested, headache with sore throat for over a week.  She stated that the doctor told her to call if she wasn't feeling better and to speak with the nurse to see what would be the next treatment for her.  Please advise and call patient back at [phone]

## 2018-10-22 NOTE — Telephone Encounter (Signed)
Patient notified

## 2018-10-22 NOTE — Telephone Encounter (Signed)
Will send in augmentin for her to take, she should come back for recheck if this does not help

## 2018-10-29 ENCOUNTER — Ambulatory Visit: Payer: Self-pay

## 2018-10-29 MED ORDER — OSELTAMIVIR PHOSPHATE 75 MG PO CAPS: 75.0000 mg | ORAL_CAPSULE | Freq: Every day | ORAL | 0 refills | Status: DC

## 2018-10-29 NOTE — Telephone Encounter (Signed)
Attempted to return call to patient. No Answer after multiple rings.

## 2018-10-29 NOTE — Telephone Encounter (Signed)
Happy to give her some tamiflu for prevention but if she's not improving from previous illness she will need to be evaluated

## 2018-10-29 NOTE — Telephone Encounter (Signed)
Patient called and says her grandchildren are living with her and one of them was diagnosed with the flu today. She asks if she can get tamiflu to prevent her from catching the flu. She says she just finished antibiotics for a sinus infection and is coughing from that, but it's getting worse. She says last night she was a little achy, slight headache, some chills. She doesn't have a fever. She also says she did not receive the flu shot and if she should go ahead and receive the vaccine. I advised someone from the office will call back with Bethany Lara's recommendation.    Reason for Disposition . [1] Influenza EXPOSURE within last 48 hours (2 days) AND [2] NOT HIGH RISK AND [3] strongly requests antiviral medication  Answer Assessment - Initial Assessment Questions 1. TYPE of EXPOSURE: "How were you exposed?" (e.g., close contact, not a close contact)     Grandchildren living in the house 2. DATE of EXPOSURE: "When did the exposure occur?" (e.g., hour, days, weeks)     Diagnosed today 3. PREGNANCY: "Is there any chance you are pregnant?" "When was your last menstrual period?"     No 4. HIGH RISK for COMPLICATIONS: "Do you have any heart or lung problems? Do you have a weakened immune system?" (e.g., CHF, COPD, asthma, HIV positive, chemotherapy, renal failure, diabetes mellitus, sickle cell anemia)     No 5. SYMPTOMS: "Do you have any symptoms?" (e.g., cough, fever, sore throat, difficulty breathing).     Slight headache, a little achy, cough, some chills  Protocols used: INFLUENZA EXPOSURE-A-AH

## 2018-10-29 NOTE — Telephone Encounter (Signed)
Patient notified

## 2018-10-29 NOTE — Telephone Encounter (Signed)
Returned call. Line busy.

## 2018-11-14 ENCOUNTER — Encounter: Payer: Medicare Other | Admitting: Family Medicine

## 2018-11-19 ENCOUNTER — Encounter: Payer: Self-pay | Admitting: Family Medicine

## 2018-11-19 ENCOUNTER — Ambulatory Visit (INDEPENDENT_AMBULATORY_CARE_PROVIDER_SITE_OTHER): Payer: Medicare Other | Admitting: Family Medicine

## 2018-11-19 VITALS — BP 131/79 | HR 68 | Temp 97.6°F | Ht 67.0 in | Wt 194.0 lb

## 2018-11-19 DIAGNOSIS — Z1211 Encounter for screening for malignant neoplasm of colon: Secondary | ICD-10-CM | POA: Diagnosis not present

## 2018-11-19 DIAGNOSIS — G4733 Obstructive sleep apnea (adult) (pediatric): Secondary | ICD-10-CM | POA: Diagnosis not present

## 2018-11-19 DIAGNOSIS — Z8673 Personal history of transient ischemic attack (TIA), and cerebral infarction without residual deficits: Secondary | ICD-10-CM

## 2018-11-19 DIAGNOSIS — E669 Obesity, unspecified: Secondary | ICD-10-CM

## 2018-11-19 DIAGNOSIS — R7301 Impaired fasting glucose: Secondary | ICD-10-CM

## 2018-11-19 DIAGNOSIS — Z1239 Encounter for other screening for malignant neoplasm of breast: Secondary | ICD-10-CM | POA: Diagnosis not present

## 2018-11-19 DIAGNOSIS — Z0000 Encounter for general adult medical examination without abnormal findings: Secondary | ICD-10-CM | POA: Diagnosis not present

## 2018-11-19 DIAGNOSIS — E7849 Other hyperlipidemia: Secondary | ICD-10-CM

## 2018-11-19 MED ORDER — PRAVASTATIN SODIUM 40 MG PO TABS: 40.0000 mg | ORAL_TABLET | Freq: Every day | ORAL | 3 refills | Status: DC

## 2018-11-19 MED ORDER — CLOPIDOGREL BISULFATE 75 MG PO TABS: 75.0000 mg | ORAL_TABLET | Freq: Every day | ORAL | 1 refills | Status: DC

## 2018-11-19 MED ORDER — CETIRIZINE HCL 10 MG PO TABS: 10.0000 mg | ORAL_TABLET | Freq: Every day | ORAL | 1 refills | Status: DC

## 2018-11-19 NOTE — Progress Notes (Signed)
Cologuard ordered via [employer] portal. Expected date 11/26/2018.

## 2018-11-19 NOTE — Progress Notes (Signed)
BP 131/79   Pulse 68   Temp 97.6 F (36.4 C) (Oral)   Ht 5\' 7"  (1.702 m)   Wt 194 lb (88 kg)   LMP  (LMP Unknown)   SpO2 97%   BMI 30.38 kg/m    Subjective:    Patient ID: Bethany Lara, female    DOB: June 14, 1951, 68 y.o.   MRN: [id number]  HPI: Bethany Lara is a 68 y.o. female presenting on 11/19/2018 for comprehensive medical examination. Current medical complaints include:none  Hx of CVA and HLD - currently on plavix, fenofibrate, pravastatin. BPs under good control with lifestyle habits. Still has some sensory deficits but otherwise has made an excellent recovery with PT and speech therapy.   OSA - sleeping well with CPAP. No concerns.   IFG - diet controlled  She currently lives with: Menopausal Symptoms: no  Depression Screen done today and results listed below:  Depression screen [facility] 2/9 11/19/2018 07/21/2018 05/14/2018 07/17/2017 07/23/2016  Decreased Interest 0 0 0 0 0  Down, Depressed, Hopeless 0 0 0 0 2  PHQ - 2 Score 0 0 0 0 2  Altered sleeping 1 - - 1 -  Tired, decreased energy 1 - - 0 -  Change in appetite 0 - - 0 -  Feeling bad or failure about yourself  0 - - 0 -  Trouble concentrating 0 - - 0 -  Moving slowly or fidgety/restless 0 - - 0 -  Suicidal thoughts 0 - - 0 -  PHQ-9 Score 2 - - 1 -  Difficult doing work/chores - - - Not difficult at all -    The patient does not have a history of falls. I did not complete a risk assessment for falls. A plan of care for falls was not documented.   Past Medical History:  Past Medical History:  Diagnosis Date  . Allergy   . Arrhythmia   . Carpal tunnel syndrome   . Hyperlipidemia     Surgical History:  Past Surgical History:  Procedure Laterality Date  . BUNIONECTOMY    . CARPAL TUNNEL RELEASE  11/14/2015  . CHOLECYSTECTOMY    . PEG PLACEMENT N/A 10/30/2017   Procedure: PERCUTANEOUS ENDOSCOPIC GASTROSTOMY (PEG) PLACEMENT;  Surgeon: Lin Landsman, MD;  Location: [facility] ENDOSCOPY;  Service:  Gastroenterology;  Laterality: N/A;    Medications:  Current Outpatient Medications on File Prior to Visit  Medication Sig  . fenofibrate 160 MG tablet TAKE 1 TABLET BY MOUTH  DAILY  . fluticasone (FLONASE) 50 MCG/ACT nasal spray Place 2 sprays into both nostrils 2 (two) times daily.  . Multiple Vitamin (MULTIVITAMIN) tablet Place 1 tablet into feeding tube daily. (Patient taking differently: Take 1 tablet by mouth daily. )  . NON FORMULARY CPAP @@ bedtime.  . Vitamin D, Cholecalciferol, 1000 units TABS Give 2,000 Units by tube daily. (Patient taking differently: Take 2,000 Units by mouth daily. )   No current facility-administered medications on file prior to visit.     Allergies:  Allergies  Allergen Reactions  . Zocor [Simvastatin] Other (See Comments)    Fatigue, legs achey     Social History:  Social History   Socioeconomic History  . Marital status: Married    Spouse name: Not on file  . Number of children: Not on file  . Years of education: 12th grade  . Highest education level: Not on file  Occupational History  . 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 Financial resource strain: Not hard at all  . Food insecurity:    Worry: Never true    Inability: Never true  . Transportation needs:    Medical: No    Non-medical: No  Tobacco Use  . Smoking status: Never Smoker  . Smokeless tobacco: Never Used  Substance and Sexual Activity  . Alcohol use: No    Alcohol/week: 0.0 standard drinks  . Drug use: No  . Sexual activity: Yes  Lifestyle  . Physical activity:    Days per week: 0 days    Minutes per session: 0 min  . Stress: Patient refused  Relationships  . Social connections:    Talks on phone: More than three times a week    Gets together: More than three times a week    Attends religious service: Patient refused    Active member of club or organization: Patient refused    Attends meetings of clubs or organizations: Patient  refused    Relationship status: Married  . Intimate partner violence:    Fear of current or ex partner: No    Emotionally abused: No    Physically abused: No    Forced sexual activity: No  Other Topics Concern  . Not on file  Social History Narrative  . Not on file   Social History   Tobacco Use  Smoking Status Never Smoker  Smokeless Tobacco Never Used   Social History   Substance and Sexual Activity  Alcohol Use No  . Alcohol/week: 0.0 standard drinks    Family History:  Family History  Problem Relation Age of Onset  . Diabetes Mother   . Hypertension Mother   . Dementia Mother   . Heart murmur Mother   . Diabetes Father   . Heart disease Father   . Alzheimer's disease Father   . Glaucoma Father   . Colon cancer Father   . Heart attack Father   . Breast cancer Maternal Aunt     Past medical history, surgical history, medications, allergies, family history and social history reviewed with patient today and changes made to appropriate areas of the chart.   Review of Systems - General ROS: negative Psychological ROS: negative Ophthalmic ROS: negative ENT ROS: negative Allergy and Immunology ROS: negative Hematological and Lymphatic ROS: negative Endocrine ROS: negative Breast ROS: negative for breast lumps Respiratory ROS: no cough, shortness of breath, or wheezing Cardiovascular ROS: no chest pain or dyspnea on exertion Gastrointestinal ROS: no abdominal pain, change in bowel habits, or black or bloody stools Genito-Urinary ROS: no dysuria, trouble voiding, or hematuria Musculoskeletal ROS: negative Neurological ROS: no TIA or stroke symptoms Dermatological ROS: negative All other ROS negative except what is listed above and in the HPI.      Objective:    BP 131/79   Pulse 68   Temp 97.6 F (36.4 C) (Oral)   Ht 5\' 7"  (1.702 m)   Wt 194 lb (88 kg)   LMP  (LMP Unknown)   SpO2 97%   BMI 30.38 kg/m   Wt Readings from Last 3 Encounters:  11/19/18  194 lb (88 kg)  10/15/18 189 lb (85.7 kg)  07/22/18 186 lb 11.2 oz (84.7 kg)    Physical Exam Vitals signs and nursing note reviewed.  Constitutional:      General: She is not in acute distress.    Appearance: She is well-developed.  HENT:     Head: Atraumatic.     Right Ear:  External ear normal.     Left Ear: External ear normal.     Nose: Nose normal.     Mouth/Throat:     Pharynx: No oropharyngeal exudate.  Eyes:     General: No scleral icterus.    Conjunctiva/sclera: Conjunctivae normal.     Pupils: Pupils are equal, round, and reactive to light.  Neck:     Musculoskeletal: Normal range of motion and neck supple.     Thyroid: No thyromegaly.  Cardiovascular:     Rate and Rhythm: Normal rate and regular rhythm.     Heart sounds: Normal heart sounds.  Pulmonary:     Effort: Pulmonary effort is normal. No respiratory distress.     Breath sounds: Normal breath sounds.  Abdominal:     General: Bowel sounds are normal.     Palpations: Abdomen is soft. There is no mass.     Tenderness: There is no abdominal tenderness.  Genitourinary:    Comments: Exam deferred Musculoskeletal: Normal range of motion.        General: No tenderness.  Lymphadenopathy:     Cervical: No cervical adenopathy.  Skin:    General: Skin is warm and dry.     Findings: No rash.  Neurological:     General: No focal deficit present.     Mental Status: She is alert and oriented to person, place, and time.     Cranial Nerves: No cranial nerve deficit.  Psychiatric:        Mood and Affect: Mood normal.        Behavior: Behavior normal.        Thought Content: Thought content normal.        Judgment: Judgment normal.     Results for orders placed or performed in visit on 11/19/18  Microscopic Examination  Result Value Ref Range   WBC, UA 0-5 0 - 5 /hpf   RBC, UA None seen 0 - 2 /hpf   Epithelial Cells (non renal) 0-10 0 - 10 /hpf   Bacteria, UA Moderate (A) None seen/Few  Urine Culture, Reflex   Result Value Ref Range   Urine Culture, Routine Final report    Organism ID, Bacteria Comment   CBC with Differential/Platelet  Result Value Ref Range   WBC 7.9 3.4 - 10.8 x10E3/uL   RBC 4.45 3.77 - 5.28 x10E6/uL   Hemoglobin 13.5 11.1 - 15.9 g/dL   Hematocrit 39.9 34.0 - 46.6 %   MCV 90 79 - 97 fL   MCH 30.3 26.6 - 33.0 pg   MCHC 33.8 31.5 - 35.7 g/dL   RDW 12.9 11.7 - 15.4 %   Platelets 259 150 - 450 x10E3/uL   Neutrophils 61 Not Estab. %   Lymphs 25 Not Estab. %   Monocytes 8 Not Estab. %   Eos 5 Not Estab. %   Basos 1 Not Estab. %   Neutrophils Absolute 4.8 1.4 - 7.0 x10E3/uL   Lymphocytes Absolute 2.0 0.7 - 3.1 x10E3/uL   Monocytes Absolute 0.7 0.1 - 0.9 x10E3/uL   EOS (ABSOLUTE) 0.4 0.0 - 0.4 x10E3/uL   Basophils Absolute 0.1 0.0 - 0.2 x10E3/uL   Immature Granulocytes 0 Not Estab. %   Immature Grans (Abs) 0.0 0.0 - 0.1 x10E3/uL  Comprehensive metabolic panel  Result Value Ref Range   Glucose 108 (H) 65 - 99 mg/dL   BUN 12 8 - 27 mg/dL   Creatinine, Ser 0.82 0.57 - 1.00 mg/dL   GFR calc non Af  Amer 73 >59 mL/min/1.73   GFR calc Af Amer 84 >59 mL/min/1.73   BUN/Creatinine Ratio 15 12 - 28   Sodium 139 134 - 144 mmol/L   Potassium 4.0 3.5 - 5.2 mmol/L   Chloride 104 96 - 106 mmol/L   CO2 22 20 - 29 mmol/L   Calcium 9.4 8.7 - 10.3 mg/dL   Total Protein 6.5 6.0 - 8.5 g/dL   Albumin 4.3 3.8 - 4.8 g/dL   Globulin, Total 2.2 1.5 - 4.5 g/dL   Albumin/Globulin Ratio 2.0 1.2 - 2.2   Bilirubin Total 0.5 0.0 - 1.2 mg/dL   Alkaline Phosphatase 69 39 - 117 IU/L   AST 19 0 - 40 IU/L   ALT 21 0 - 32 IU/L  Lipid Panel w/o Chol/HDL Ratio  Result Value Ref Range   Cholesterol, Total 205 (H) 100 - 199 mg/dL   Triglycerides 154 (H) 0 - 149 mg/dL   HDL 61 >39 mg/dL   VLDL Cholesterol Cal 31 5 - 40 mg/dL   LDL Calculated 113 (H) 0 - 99 mg/dL  UA/M w/rflx Culture, Routine  Result Value Ref Range   Specific Gravity, UA 1.015 1.005 - 1.030   pH, UA 7.0 5.0 - 7.5   Color, UA  Yellow Yellow   Appearance Ur Hazy (A) Clear   Leukocytes, UA 3+ (A) Negative   Protein, UA Negative Negative/Trace   Glucose, UA Negative Negative   Ketones, UA Negative Negative   RBC, UA Negative Negative   Bilirubin, UA Negative Negative   Urobilinogen, Ur 0.2 0.2 - 1.0 mg/dL   Nitrite, UA Negative Negative   Microscopic Examination See below:    Urinalysis Reflex Comment   TSH  Result Value Ref Range   TSH 3.180 0.450 - 4.500 uIU/mL      Assessment & Plan:   Problem List Items Addressed This Visit      Respiratory   Sleep apnea - Primary    Stable, continue CPAP and sleep hygiene        Endocrine   IFG (impaired fasting glucose)    Recheck A1C, continue good lifestyle modifications      Relevant Orders   UA/M w/rflx Culture, Routine (Completed)     Other   Hyperlipidemia    Recheck lipids, continue current regimen      Relevant Medications   pravastatin (PRAVACHOL) 40 MG tablet   Other Relevant Orders   Comprehensive metabolic panel (Completed)   Lipid Panel w/o Chol/HDL Ratio (Completed)   History of CVA (cerebrovascular accident)    Stable on plavix and BP, chol control. Following with Neurology      Relevant Orders   CBC with Differential/Platelet (Completed)   Obesity (BMI 30-39.9)    Continue lifestyle modifications      Relevant Orders   TSH (Completed)    Other Visit Diagnoses    Annual physical exam       Colon cancer screening       Relevant Orders   Cologuard   Screening for breast cancer       Relevant Orders   MM DIGITAL SCREENING BILATERAL       Follow up plan: Return in about 6 months (around 05/20/2019) for 6 month f/u.   LABORATORY TESTING:  - Pap smear: not applicable  IMMUNIZATIONS:   - Tdap: Tetanus vaccination status reviewed: postponed. - Influenza: Refused - Pneumovax: Up to date - Prevnar: Up to date - HPV: Not applicable - Zostavax vaccine: Up to date  SCREENING: -Mammogram: Ordered today  - Colonoscopy:  Ordered today  - Bone Density: Up to date   PATIENT COUNSELING:   Advised to take 1 mg of folate supplement per day if capable of pregnancy.   Sexuality: Discussed sexually transmitted diseases, partner selection, use of condoms, avoidance of unintended pregnancy  and contraceptive alternatives.   Advised to avoid cigarette smoking.  I discussed with the patient that most people either abstain from alcohol or drink within safe limits (<=14/week and <=4 drinks/occasion for males, <=7/weeks and <= 3 drinks/occasion for females) and that the risk for alcohol disorders and other health effects rises proportionally with the number of drinks per week and how often a drinker exceeds daily limits.  Discussed cessation/primary prevention of drug use and availability of treatment for abuse.   Diet: Encouraged to adjust caloric intake to maintain  or achieve ideal body weight, to reduce intake of dietary saturated fat and total fat, to limit sodium intake by avoiding high sodium foods and not adding table salt, and to maintain adequate dietary potassium and calcium preferably from fresh fruits, vegetables, and low-fat dairy products.    stressed the importance of regular exercise  Injury prevention: Discussed safety belts, safety helmets, smoke detector, smoking near bedding or upholstery.   Dental health: Discussed importance of regular tooth brushing, flossing, and dental visits.    NEXT PREVENTATIVE PHYSICAL DUE IN 1 YEAR. Return in about 6 months (around 05/20/2019) for 6 month f/u.

## 2018-11-20 LAB — LIPID PANEL W/O CHOL/HDL RATIO
Cholesterol, Total: 205 mg/dL — ABNORMAL HIGH (ref 100–199)
HDL: 61 mg/dL (ref >39)
LDL Calculated: 113 mg/dL — ABNORMAL HIGH (ref 0–99)
Triglycerides: 154 mg/dL — ABNORMAL HIGH (ref 0–149)
VLDL Cholesterol Cal: 31 mg/dL (ref 5–40)

## 2018-11-20 LAB — CBC WITH DIFFERENTIAL/PLATELET
Basophils Absolute: 0.1 10*3/uL (ref 0.0–0.2)
Basos: 1 %
EOS (ABSOLUTE): 0.4 10*3/uL (ref 0.0–0.4)
Eos: 5 %
Hematocrit: 39.9 % (ref 34.0–46.6)
Hemoglobin: 13.5 g/dL (ref 11.1–15.9)
Immature Grans (Abs): 0 10*3/uL (ref 0.0–0.1)
Immature Granulocytes: 0 %
Lymphocytes Absolute: 2 10*3/uL (ref 0.7–3.1)
Lymphs: 25 %
MCH: 30.3 pg (ref 26.6–33.0)
MCHC: 33.8 g/dL (ref 31.5–35.7)
MCV: 90 fL (ref 79–97)
MONOS ABS: 0.7 10*3/uL (ref 0.1–0.9)
Monocytes: 8 %
Neutrophils Absolute: 4.8 10*3/uL (ref 1.4–7.0)
Neutrophils: 61 %
Platelets: 259 10*3/uL (ref 150–450)
RBC: 4.45 x10E6/uL (ref 3.77–5.28)
RDW: 12.9 % (ref 11.7–15.4)
WBC: 7.9 10*3/uL (ref 3.4–10.8)

## 2018-11-20 LAB — COMPREHENSIVE METABOLIC PANEL
ALT: 21 IU/L (ref 0–32)
AST: 19 IU/L (ref 0–40)
Albumin/Globulin Ratio: 2 (ref 1.2–2.2)
Albumin: 4.3 g/dL (ref 3.8–4.8)
Alkaline Phosphatase: 69 IU/L (ref 39–117)
BUN/Creatinine Ratio: 15 (ref 12–28)
BUN: 12 mg/dL (ref 8–27)
Bilirubin Total: 0.5 mg/dL (ref 0.0–1.2)
CO2: 22 mmol/L (ref 20–29)
Calcium: 9.4 mg/dL (ref 8.7–10.3)
Chloride: 104 mmol/L (ref 96–106)
Creatinine, Ser: 0.82 mg/dL (ref 0.57–1.00)
GFR calc Af Amer: 84 mL/min/{1.73_m2} (ref >59)
GFR calc non Af Amer: 73 mL/min/{1.73_m2} (ref >59)
Globulin, Total: 2.2 g/dL (ref 1.5–4.5)
Glucose: 108 mg/dL — ABNORMAL HIGH (ref 65–99)
Potassium: 4 mmol/L (ref 3.5–5.2)
SODIUM: 139 mmol/L (ref 134–144)
Total Protein: 6.5 g/dL (ref 6.0–8.5)

## 2018-11-20 LAB — TSH: TSH: 3.18 u[IU]/mL (ref 0.450–4.500)

## 2018-11-22 LAB — UA/M W/RFLX CULTURE, ROUTINE
Bilirubin, UA: NEGATIVE
Glucose, UA: NEGATIVE
Ketones, UA: NEGATIVE
Nitrite, UA: NEGATIVE
PH UA: 7 (ref 5.0–7.5)
Protein, UA: NEGATIVE
RBC, UA: NEGATIVE
Specific Gravity, UA: 1.015 (ref 1.005–1.030)
Urobilinogen, Ur: 0.2 mg/dL (ref 0.2–1.0)

## 2018-11-22 LAB — MICROSCOPIC EXAMINATION: RBC, UA: NONE SEEN /hpf (ref 0–2)

## 2018-11-22 LAB — URINE CULTURE, REFLEX

## 2018-11-22 NOTE — Assessment & Plan Note (Signed)
Stable on plavix and BP, chol control. Following with Neurology

## 2018-11-22 NOTE — Assessment & Plan Note (Signed)
Recheck lipids, continue current regimen 

## 2018-11-22 NOTE — Assessment & Plan Note (Signed)
Continue lifestyle modifications. 

## 2018-11-22 NOTE — Assessment & Plan Note (Signed)
Stable, continue CPAP and sleep hygiene

## 2018-11-22 NOTE — Assessment & Plan Note (Signed)
Recheck A1C, continue good lifestyle modifications

## 2018-11-24 ENCOUNTER — Other Ambulatory Visit: Payer: Self-pay | Admitting: Family Medicine

## 2018-11-24 MED ORDER — ROSUVASTATIN CALCIUM 10 MG PO TABS: 10.0000 mg | ORAL_TABLET | Freq: Every day | ORAL | 1 refills | Status: DC

## 2018-11-26 LAB — COLOGUARD: Cologuard: NEGATIVE

## 2019-03-06 ENCOUNTER — Other Ambulatory Visit: Payer: Self-pay | Admitting: Family Medicine

## 2019-03-06 ENCOUNTER — Telehealth: Payer: Self-pay | Admitting: Family Medicine

## 2019-03-06 NOTE — Chronic Care Management (AMB) (Signed)
Chronic Care Management  ° °Note ° °03/06/2019 °Name: Alicia G Prescher MRN: [id number] DOB: 01/06/1951 ° °Yailen G Mansel is a 67 y.o. year old female who is a primary care patient of Lane, Rachel Elizabeth, PA-C. I reached out to Jenet G Speth by phone today in response to a referral sent by Ms. Carianna G Godden's health plan.   ° °Ms. Derks was given information about Chronic Care Management services today including:  °1. CCM service includes personalized support from designated clinical staff supervised by her physician, including individualized plan of care and coordination with other care providers °2. 24/7 contact phone numbers for assistance for urgent and routine care needs. °3. Service will only be billed when office clinical staff spend 20 minutes or more in a month to coordinate care. °4. Only one practitioner may furnish and bill the service in a calendar month. °5. The patient may stop CCM services at any time (effective at the end of the month) by phone call to the office staff. °6. The patient will be responsible for cost sharing (co-pay) of up to 20% of the service fee (after annual deductible is met). ° °Patient agreed to services and verbal consent obtained.  ° °Follow up plan: °Telephone appointment with CCM team member scheduled for: 03/12/2019 ° °Bernice Cicero °Care Guide • [facility] °[place]   [facility]  °[email or web address]   ??[phone]   ° ° ° °

## 2019-03-09 ENCOUNTER — Other Ambulatory Visit: Payer: Self-pay

## 2019-03-09 MED ORDER — FLUTICASONE PROPIONATE 50 MCG/ACT NA SUSP: 2.0000 | Freq: Two times a day (BID) | NASAL | 11 refills | Status: DC

## 2019-03-09 NOTE — Telephone Encounter (Signed)
Patient last seen 11/19/18 and has f/up 05/20/19.

## 2019-03-12 ENCOUNTER — Telehealth: Payer: Medicare Other

## 2019-04-06 ENCOUNTER — Encounter: Payer: Self-pay | Admitting: Family Medicine

## 2019-04-06 ENCOUNTER — Other Ambulatory Visit: Payer: Self-pay

## 2019-04-06 ENCOUNTER — Ambulatory Visit (INDEPENDENT_AMBULATORY_CARE_PROVIDER_SITE_OTHER): Payer: Medicare Other | Admitting: Family Medicine

## 2019-04-06 DIAGNOSIS — M25511 Pain in right shoulder: Secondary | ICD-10-CM

## 2019-04-06 DIAGNOSIS — J0100 Acute maxillary sinusitis, unspecified: Secondary | ICD-10-CM

## 2019-04-06 MED ORDER — AMOXICILLIN-POT CLAVULANATE 875-125 MG PO TABS: 1.0000 | ORAL_TABLET | Freq: Two times a day (BID) | ORAL | 0 refills | Status: DC

## 2019-04-06 MED ORDER — ROSUVASTATIN CALCIUM 10 MG PO TABS: 10.0000 mg | ORAL_TABLET | Freq: Every day | ORAL | 1 refills | Status: DC

## 2019-04-06 MED ORDER — DICLOFENAC SODIUM 1 % TD GEL: 2.0000 g | Freq: Four times a day (QID) | TRANSDERMAL | 2 refills | Status: DC

## 2019-04-06 NOTE — Progress Notes (Signed)
LMP  (LMP Unknown)    Subjective:    Patient ID: Bethany Lara, female    DOB: January 09, 1951, 68 y.o.   MRN: [id number]  HPI: Bethany Lara is a 68 y.o. female  Chief Complaint  Patient presents with  . URI    started about 3 weeks ago. has been on robitussin DM  . Shoulder Pain    right, started after she went swimming  . Medication Problem    Patient would like to know what cholesterol medication she should be taking, she has 3, she is currently taking pravastatin and fenofibrate currently, but picked up a script for rosuvastatin, but did not start because she uis unsure if she is to take them all together    . This visit was completed via telephone due to the restrictions of the COVID-19 pandemic. All issues as above were discussed and addressed. Physical exam was done as above through visual confirmation on telephone. If it was felt that the patient should be evaluated in the office, they were directed there. The patient verbally consented to this visit. . Location of the patient: home . Location of the provider: home . Those involved with this call:  . Provider: Merrie Roof, PA-C . CMA: Tiffany Reel, CMA . Front Desk/Registration: Jill Side  . Time spent on call: 15 minutes on the phone discussing health concerns. 5 minutes total spent in review of patient's record and preparation of their chart. I verified patient identity using two factors (patient name and date of birth). Patient consents verbally to being seen via telemedicine visit today.   Presenting for 3 weeks of intermittent productive cough, congestion, facial pressure, headache. Taking robitussin DM with mild temporary relief as well as consistent with her allergy regimen of zyrtec and flonase. Denies fever, chills, body aches, CP, SOB, sick contacts, recent travel.   Has been swimming for exercise and now having right shoulder pain the past few weeks. Has had a hard time sleeping because of this. Taking  tylenol for this which seems to help as does aspercream. Has good movement in the arm just gets stiff and painful. No direct injury, swelling, color change, numbness or tingling.   Relevant past medical, surgical, family and social history reviewed and updated as indicated. Interim medical history since our last visit reviewed. Allergies and medications reviewed and updated.  Review of Systems  Per HPI unless specifically indicated above     Objective:    LMP  (LMP Unknown)   Wt Readings from Last 3 Encounters:  11/19/18 194 lb (88 kg)  10/15/18 189 lb (85.7 kg)  07/22/18 186 lb 11.2 oz (84.7 kg)    Physical Exam  Unable to perform PE due to patient lack of access to video technology for today's visit.   Results for orders placed or performed in visit on 11/19/18  Microscopic Examination   URINE  Result Value Ref Range   WBC, UA 0-5 0 - 5 /hpf   RBC, UA None seen 0 - 2 /hpf   Epithelial Cells (non renal) 0-10 0 - 10 /hpf   Bacteria, UA Moderate (A) None seen/Few  Urine Culture, Reflex   URINE  Result Value Ref Range   Urine Culture, Routine Final report    Organism ID, Bacteria Comment   CBC with Differential/Platelet  Result Value Ref Range   WBC 7.9 3.4 - 10.8 x10E3/uL   RBC 4.45 3.77 - 5.28 x10E6/uL   Hemoglobin 13.5 11.1 - 15.9 g/dL  Hematocrit 39.9 34.0 - 46.6 %   MCV 90 79 - 97 fL   MCH 30.3 26.6 - 33.0 pg   MCHC 33.8 31.5 - 35.7 g/dL   RDW 12.9 11.7 - 15.4 %   Platelets 259 150 - 450 x10E3/uL   Neutrophils 61 Not Estab. %   Lymphs 25 Not Estab. %   Monocytes 8 Not Estab. %   Eos 5 Not Estab. %   Basos 1 Not Estab. %   Neutrophils Absolute 4.8 1.4 - 7.0 x10E3/uL   Lymphocytes Absolute 2.0 0.7 - 3.1 x10E3/uL   Monocytes Absolute 0.7 0.1 - 0.9 x10E3/uL   EOS (ABSOLUTE) 0.4 0.0 - 0.4 x10E3/uL   Basophils Absolute 0.1 0.0 - 0.2 x10E3/uL   Immature Granulocytes 0 Not Estab. %   Immature Grans (Abs) 0.0 0.0 - 0.1 x10E3/uL  Comprehensive metabolic panel   Result Value Ref Range   Glucose 108 (H) 65 - 99 mg/dL   BUN 12 8 - 27 mg/dL   Creatinine, Ser 0.82 0.57 - 1.00 mg/dL   GFR calc non Af Amer 73 >59 mL/min/1.73   GFR calc Af Amer 84 >59 mL/min/1.73   BUN/Creatinine Ratio 15 12 - 28   Sodium 139 134 - 144 mmol/L   Potassium 4.0 3.5 - 5.2 mmol/L   Chloride 104 96 - 106 mmol/L   CO2 22 20 - 29 mmol/L   Calcium 9.4 8.7 - 10.3 mg/dL   Total Protein 6.5 6.0 - 8.5 g/dL   Albumin 4.3 3.8 - 4.8 g/dL   Globulin, Total 2.2 1.5 - 4.5 g/dL   Albumin/Globulin Ratio 2.0 1.2 - 2.2   Bilirubin Total 0.5 0.0 - 1.2 mg/dL   Alkaline Phosphatase 69 39 - 117 IU/L   AST 19 0 - 40 IU/L   ALT 21 0 - 32 IU/L  Lipid Panel w/o Chol/HDL Ratio  Result Value Ref Range   Cholesterol, Total 205 (H) 100 - 199 mg/dL   Triglycerides 154 (H) 0 - 149 mg/dL   HDL 61 >39 mg/dL   VLDL Cholesterol Cal 31 5 - 40 mg/dL   LDL Calculated 113 (H) 0 - 99 mg/dL  UA/M w/rflx Culture, Routine   Specimen: Urine   URINE  Result Value Ref Range   Specific Gravity, UA 1.015 1.005 - 1.030   pH, UA 7.0 5.0 - 7.5   Color, UA Yellow Yellow   Appearance Ur Hazy (A) Clear   Leukocytes, UA 3+ (A) Negative   Protein, UA Negative Negative/Trace   Glucose, UA Negative Negative   Ketones, UA Negative Negative   RBC, UA Negative Negative   Bilirubin, UA Negative Negative   Urobilinogen, Ur 0.2 0.2 - 1.0 mg/dL   Nitrite, UA Negative Negative   Microscopic Examination See below:    Urinalysis Reflex Comment   TSH  Result Value Ref Range   TSH 3.180 0.450 - 4.500 uIU/mL      Assessment & Plan:   Problem List Items Addressed This Visit    None    Visit Diagnoses    Acute maxillary sinusitis, recurrence not specified    -  Primary   Tx with augmentin, sinus rinses, continued allergy regimen. F/u if worsening or not improving   Relevant Medications   amoxicillin-clavulanate (AUGMENTIN) 875-125 MG tablet   Acute pain of right shoulder       Suspect muscle strain from  overuse/lack of stretching/warmup. Will trial diclofenac gel, heat, stretches, rest       Follow  up plan: Return if symptoms worsen or fail to improve.

## 2019-04-10 ENCOUNTER — Other Ambulatory Visit: Payer: Self-pay

## 2019-04-10 ENCOUNTER — Ambulatory Visit: Payer: Self-pay | Admitting: Licensed Clinical Social Worker

## 2019-04-10 DIAGNOSIS — M25511 Pain in right shoulder: Secondary | ICD-10-CM

## 2019-04-10 NOTE — Chronic Care Management (AMB) (Signed)
  Chronic Care Management    Clinical Social Work Follow Up Note  04/10/2019 Name: Bethany Lara MRN: [id number] DOB: 03-Sep-1951  Bethany Lara is a 68 y.o. year old female who is a primary care patient of Volney American, [place]. The CCM team was consulted for assistance with Mental Health Counseling and Resources.   Review of patient status, including review of consultants reports, other relevant assessments, and collaboration with appropriate care team members and the patient's provider was performed as part of comprehensive patient evaluation and provision of chronic care management services.    LCSW completed outreach to patient and was able to successfully reach her. HIPPA verifications provided successfully. Patient has already consented to CCM program but states that she has no current case management needs at this time and does not wish to gain counseling services or mental health support at this time. Patient shares that things have improved and that her stress has decreased. Patient shares that her son is back home now and working full time. Patient reports having a very strong support network that she can lean on whenever needed. Patient reports that she has been implementing healthy self-care such as swimming exercises into her routine to help alleviate her pain. She reports that PCP prescribed her some gel to cope with her chronic pain and it has been extremely helpful. Patient confirms that her sleeping habits have improved now that her son is back home. LCSW provided CCM contact information and numbers to patient. Patient is agreeable to contact CCM program in the future if/when needs arise.  Follow Up Plan: Client will contact LCSW if she wishes to gain case management support or wants to consider mental health treatment  Eula Fried, [place], MSW, [email or web address] Phone:  [phone]

## 2019-04-22 IMAGING — MR MR HEAD W/O CM
10 series · 48 of 48 positions shown · non-contrast
Comparison: Head CT 10/26/2017 and MRI 08/31/2017

CLINICAL DATA: Vertigo, right facial droop, and dysarthria.

EXAM:
MRI HEAD WITHOUT CONTRAST
TECHNIQUE: Multiplanar, multiecho pulse sequences of the brain and surrounding
structures were obtained without intravenous contrast.

[Series 2: T1 · sagittal · 5.0mm · 0.45mm/px · 3 of 25 slices shown (1 of 2)]
[im 1/25]
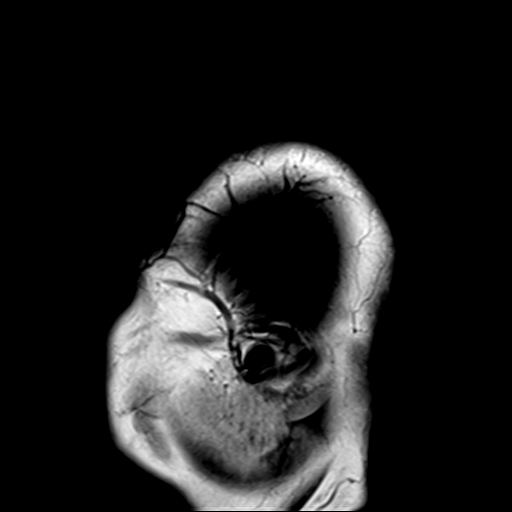
[im 13/25]
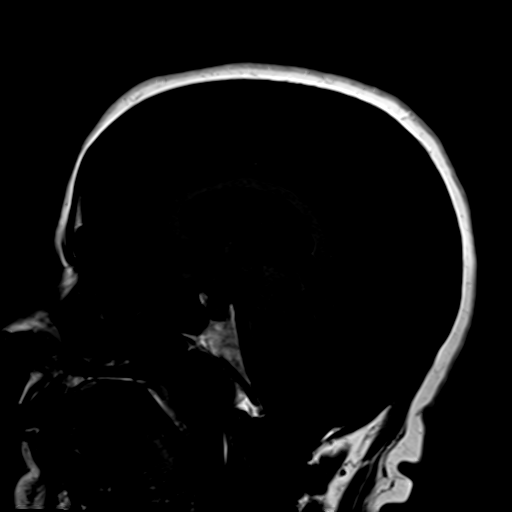
[im 25/25]
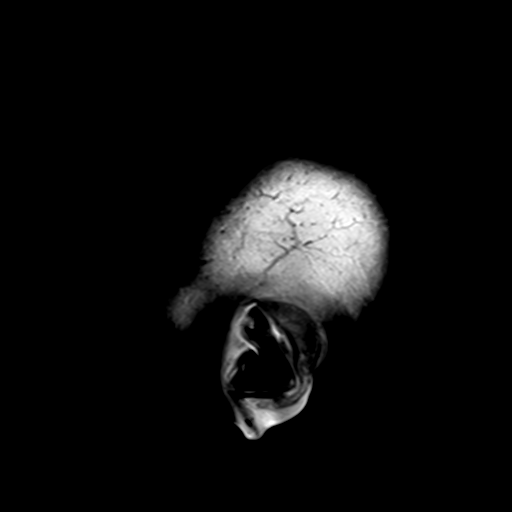

[Series 4: DWI · axial · 3.0mm · 1.80mm/px · z∈[-41,+118]mm · 5 of 54 slices shown (1 of 2)]
[im 1/54]
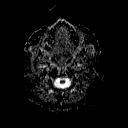
[im 14/54]
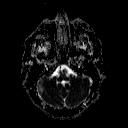
[im 27/54]
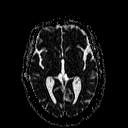
[im 40/54]
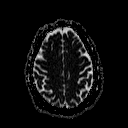
[im 54/54]
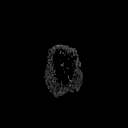

[Series 6: DWI · coronal · 3.0mm · 1.80mm/px · 4 of 45 slices shown (2 of 2)]
[im 1/45]
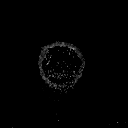
[im 15/45]
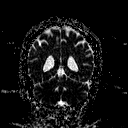
[im 30/45]
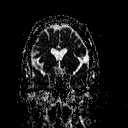
[im 45/45]
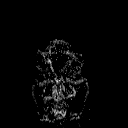

[Series 7: T2 · axial · 5.0mm · 0.60mm/px · z∈[-36,+117]mm · 2 of 25 slices shown (1 of 3)]
[im 1/25]
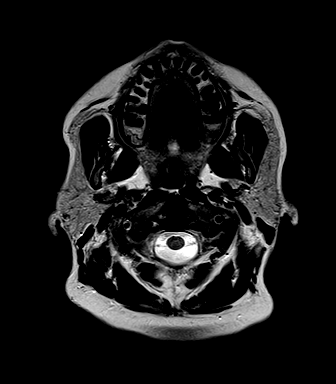
[im 25/25]
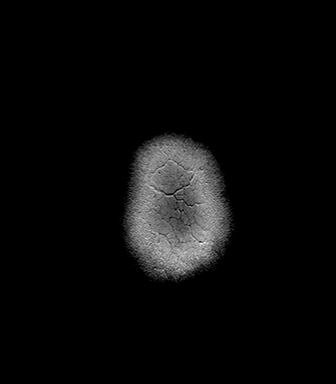

[Series 8: FLAIR · axial · 3.0mm · 0.45mm/px · z∈[-50,+105]mm · 5 of 53 slices shown]
[im 1/53]
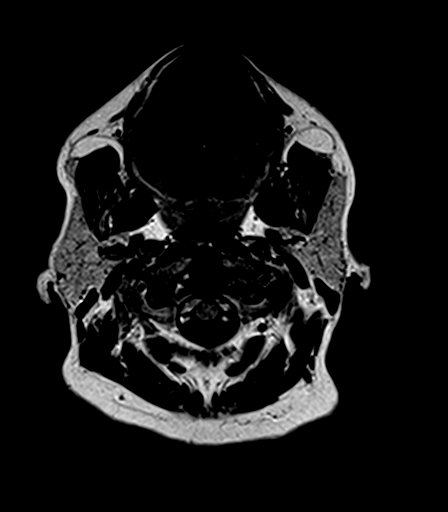
[im 14/53]
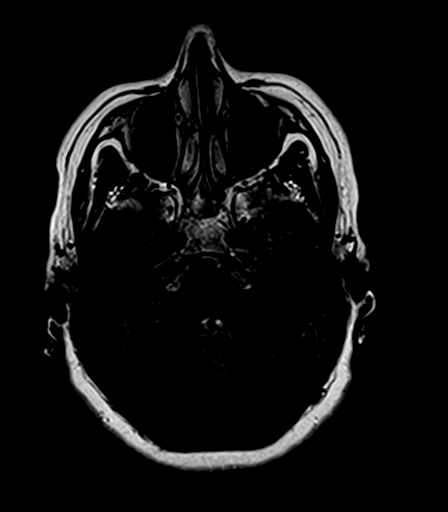
[im 27/53]
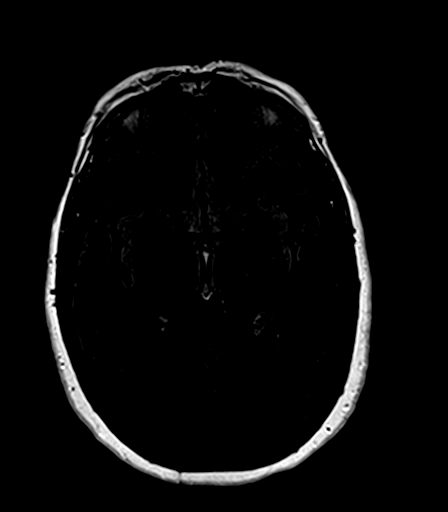
[im 40/53]
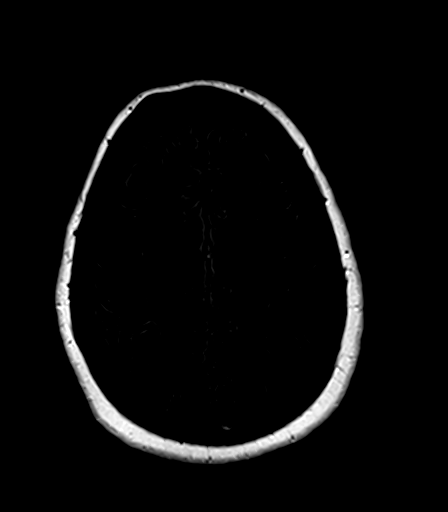
[im 53/53]
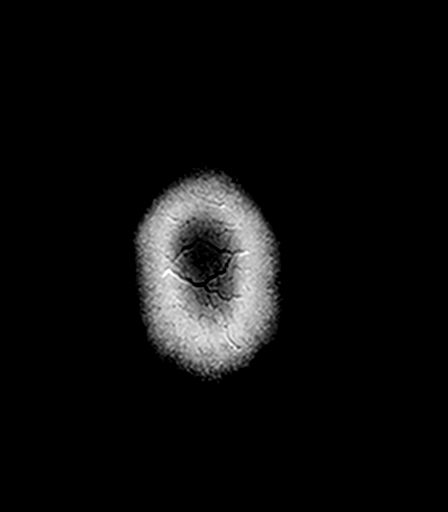

[Series 9: T2 · axial · 5.0mm · 0.45mm/px · z∈[-36,+117]mm · 2 of 25 slices shown (2 of 3)]
[im 1/25]
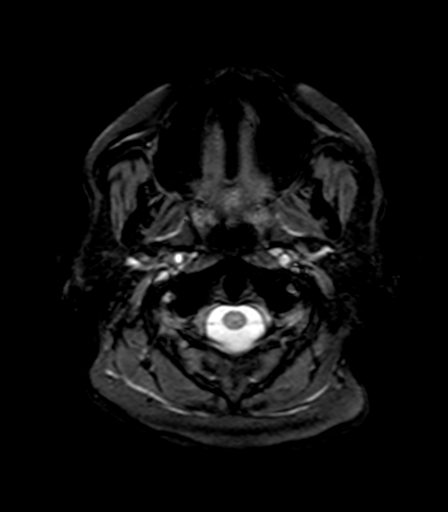
[im 25/25]
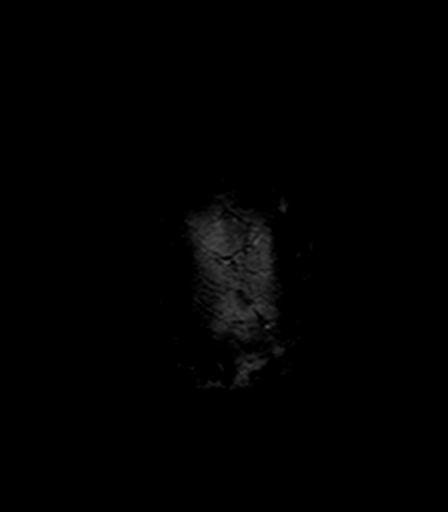

[Series 10: T1 · axial · 1.0mm · 1.00mm/px · z∈[-43,+129]mm · 16 of 176 slices shown (2 of 2)]
[im 1/176]
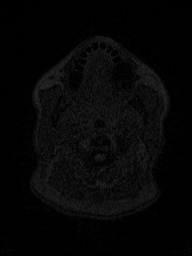
[im 12/176]
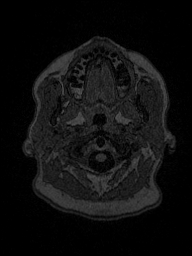
[im 24/176]
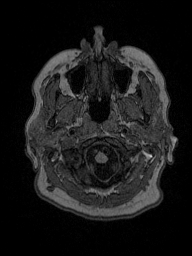
[im 36/176]
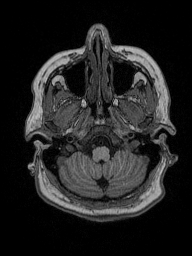
[im 47/176]
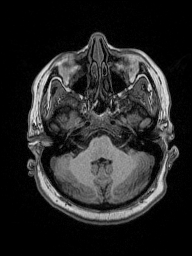
[im 59/176]
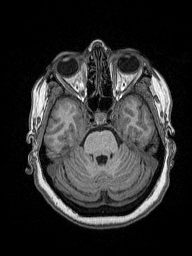
[im 71/176]
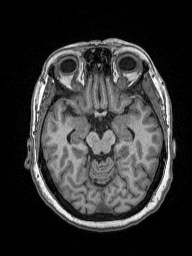
[im 82/176]
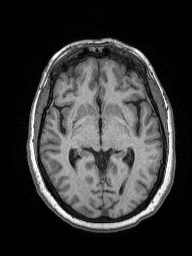
[im 94/176]
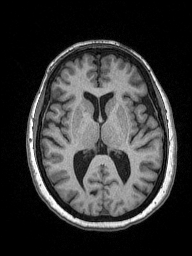
[im 106/176]
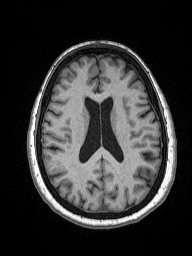
[im 117/176]
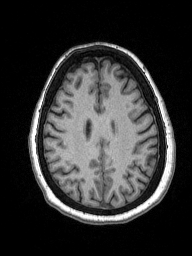
[im 129/176]
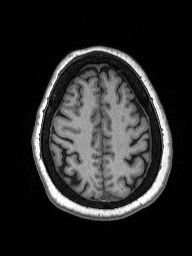
[im 141/176]
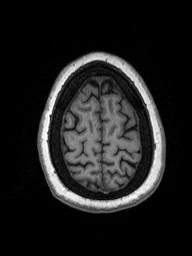
[im 152/176]
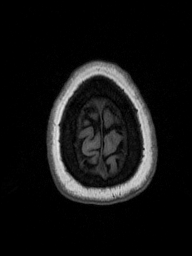
[im 164/176]
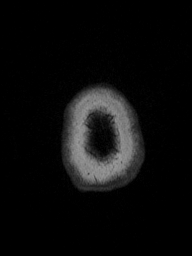
[im 176/176]
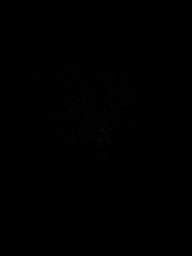

[Series 11: T2 · coronal · 5.0mm · 0.49mm/px · 2 of 27 slices shown (3 of 3)]
[im 1/27]
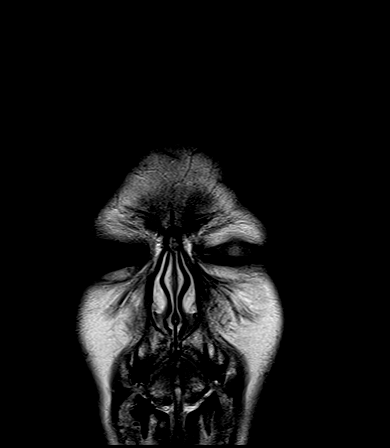
[im 27/27]
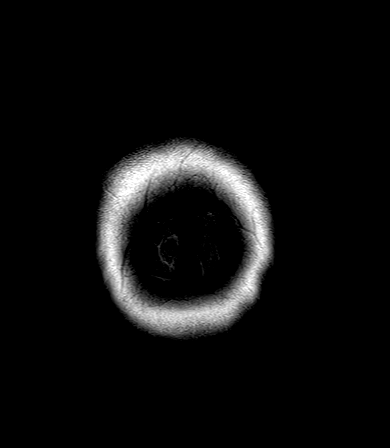

[Series 101: cor (id) · coronal · 3.0mm · 1.80mm/px · 4 of 45 slices shown]
[im 1/45]
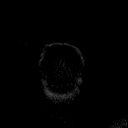
[im 15/45]
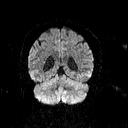
[im 30/45]
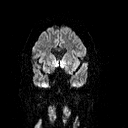
[im 45/45]
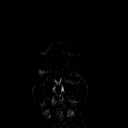

[Series 102: (id) · axial · 3.0mm · 1.80mm/px · z∈[-41,+118]mm · 5 of 55 slices shown]
[im 1/55]
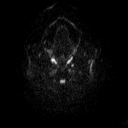
[im 14/55]
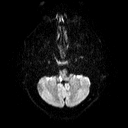
[im 28/55]
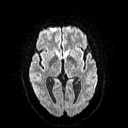
[im 41/55]
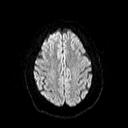
[im 55/55]
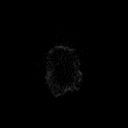

[48 of 48 positions shown; findings below may reference images not displayed]

FINDINGS: Brain: There is a 9 x 4 mm acute infarct in the upper right lateral
medulla. A few scattered punctate foci of cerebral white matter T2
hyperintensity are unchanged from the prior MRI and nonspecific.
There is very mild cerebral atrophy. No intracranial hemorrhage,
mass, midline shift, or extra-axial fluid collection is identified.

Vascular: Major intracranial arterial flow voids are preserved. T2
hyperintensity in the left sigmoid sinus and jugular bulb likely
reflects slow flow.

Skull and upper cervical spine: Small right frontal skull osteoma
and benign appearing small left frontal scalp nodule, unchanged from
the prior MRI. No suspicious marrow lesion.

Sinuses/Orbits: Unremarkable orbits.  No significant sinus disease.

Other: None.
IMPRESSION: Small acute right lateral medullary infarct.

## 2019-04-23 ENCOUNTER — Ambulatory Visit (INDEPENDENT_AMBULATORY_CARE_PROVIDER_SITE_OTHER): Payer: Medicare Other | Admitting: Family Medicine

## 2019-04-23 ENCOUNTER — Encounter: Payer: Self-pay | Admitting: Family Medicine

## 2019-04-23 ENCOUNTER — Other Ambulatory Visit: Payer: Self-pay

## 2019-04-23 VITALS — Ht 61.0 in | Wt 195.0 lb

## 2019-04-23 DIAGNOSIS — R05 Cough: Secondary | ICD-10-CM

## 2019-04-23 DIAGNOSIS — J309 Allergic rhinitis, unspecified: Secondary | ICD-10-CM

## 2019-04-23 DIAGNOSIS — R059 Cough, unspecified: Secondary | ICD-10-CM

## 2019-04-23 MED ORDER — MONTELUKAST SODIUM 10 MG PO TABS: 10.0000 mg | ORAL_TABLET | Freq: Every day | ORAL | 3 refills | Status: DC

## 2019-04-23 NOTE — Progress Notes (Signed)
Ht 5\' 1"  (1.549 m)   Wt 195 lb (88.5 kg)   LMP  (LMP Unknown)   BMI 36.84 kg/m    Subjective:    Patient ID: Bethany Lara, female    DOB: January 16, 1951, 68 y.o.   MRN: [id number]  HPI: Bethany Lara is a 68 y.o. female  Chief Complaint  Patient presents with  . Cough    Ongoing 4-5 weeks. Productive Cough.   . Nasal Congestion    . This visit was completed via telephone due to the restrictions of the COVID-19 pandemic. All issues as above were discussed and addressed. Physical exam was done as above through visual confirmation on telephone. If it was felt that the patient should be evaluated in the office, they were directed there. The patient verbally consented to this visit. . Location of the patient: home . Location of the provider: work . Those involved with this call:  . Provider: Merrie Roof, PA-C . CMA: Merilyn Baba, [place] . Front Desk/Registration: Jill Side  . Time spent on call: 15 minutes on the phone discussing health concerns. 5 minutes total spent in review of patient's record and preparation of their chart. I verified patient identity using two factors (patient name and date of birth). Patient consents verbally to being seen via telemedicine visit today.   Presenting today with intermittent productive cough ongoing x 4-5 weeks. Congestion seems to be less than before, but now just dealing with a tickle in her throat that keeps making her cough. Trying robitussin DM and some mucinex maximum strength in addition to her flonase and zyrtec regimen. Denies fevers, CP, SOB, sick contacts, sore throat, ear pain.   Relevant past medical, surgical, family and social history reviewed and updated as indicated. Interim medical history since our last visit reviewed. Allergies and medications reviewed and updated.  Review of Systems  Per HPI unless specifically indicated above     Objective:    Ht 5\' 1"  (1.549 m)   Wt 195 lb (88.5 kg)   LMP  (LMP Unknown)    BMI 36.84 kg/m   Wt Readings from Last 3 Encounters:  04/23/19 195 lb (88.5 kg)  11/19/18 194 lb (88 kg)  10/15/18 189 lb (85.7 kg)    Physical Exam  Unable to perform PE due to patient lack of access to video technology for today's visit  Results for orders placed or performed in visit on 11/19/18  Microscopic Examination   URINE  Result Value Ref Range   WBC, UA 0-5 0 - 5 /hpf   RBC, UA None seen 0 - 2 /hpf   Epithelial Cells (non renal) 0-10 0 - 10 /hpf   Bacteria, UA Moderate (A) None seen/Few  Urine Culture, Reflex   URINE  Result Value Ref Range   Urine Culture, Routine Final report    Organism ID, Bacteria Comment   CBC with Differential/Platelet  Result Value Ref Range   WBC 7.9 3.4 - 10.8 x10E3/uL   RBC 4.45 3.77 - 5.28 x10E6/uL   Hemoglobin 13.5 11.1 - 15.9 g/dL   Hematocrit 39.9 34.0 - 46.6 %   MCV 90 79 - 97 fL   MCH 30.3 26.6 - 33.0 pg   MCHC 33.8 31.5 - 35.7 g/dL   RDW 12.9 11.7 - 15.4 %   Platelets 259 150 - 450 x10E3/uL   Neutrophils 61 Not Estab. %   Lymphs 25 Not Estab. %   Monocytes 8 Not Estab. %   Eos 5  Not Estab. %   Basos 1 Not Estab. %   Neutrophils Absolute 4.8 1.4 - 7.0 x10E3/uL   Lymphocytes Absolute 2.0 0.7 - 3.1 x10E3/uL   Monocytes Absolute 0.7 0.1 - 0.9 x10E3/uL   EOS (ABSOLUTE) 0.4 0.0 - 0.4 x10E3/uL   Basophils Absolute 0.1 0.0 - 0.2 x10E3/uL   Immature Granulocytes 0 Not Estab. %   Immature Grans (Abs) 0.0 0.0 - 0.1 x10E3/uL  Comprehensive metabolic panel  Result Value Ref Range   Glucose 108 (H) 65 - 99 mg/dL   BUN 12 8 - 27 mg/dL   Creatinine, Ser 0.82 0.57 - 1.00 mg/dL   GFR calc non Af Amer 73 >59 mL/min/1.73   GFR calc Af Amer 84 >59 mL/min/1.73   BUN/Creatinine Ratio 15 12 - 28   Sodium 139 134 - 144 mmol/L   Potassium 4.0 3.5 - 5.2 mmol/L   Chloride 104 96 - 106 mmol/L   CO2 22 20 - 29 mmol/L   Calcium 9.4 8.7 - 10.3 mg/dL   Total Protein 6.5 6.0 - 8.5 g/dL   Albumin 4.3 3.8 - 4.8 g/dL   Globulin, Total 2.2 1.5 -  4.5 g/dL   Albumin/Globulin Ratio 2.0 1.2 - 2.2   Bilirubin Total 0.5 0.0 - 1.2 mg/dL   Alkaline Phosphatase 69 39 - 117 IU/L   AST 19 0 - 40 IU/L   ALT 21 0 - 32 IU/L  Lipid Panel w/o Chol/HDL Ratio  Result Value Ref Range   Cholesterol, Total 205 (H) 100 - 199 mg/dL   Triglycerides 154 (H) 0 - 149 mg/dL   HDL 61 >39 mg/dL   VLDL Cholesterol Cal 31 5 - 40 mg/dL   LDL Calculated 113 (H) 0 - 99 mg/dL  UA/M w/rflx Culture, Routine   Specimen: Urine   URINE  Result Value Ref Range   Specific Gravity, UA 1.015 1.005 - 1.030   pH, UA 7.0 5.0 - 7.5   Color, UA Yellow Yellow   Appearance Ur Hazy (A) Clear   Leukocytes, UA 3+ (A) Negative   Protein, UA Negative Negative/Trace   Glucose, UA Negative Negative   Ketones, UA Negative Negative   RBC, UA Negative Negative   Bilirubin, UA Negative Negative   Urobilinogen, Ur 0.2 0.2 - 1.0 mg/dL   Nitrite, UA Negative Negative   Microscopic Examination See below:    Urinalysis Reflex Comment   TSH  Result Value Ref Range   TSH 3.180 0.450 - 4.500 uIU/mL      Assessment & Plan:   Problem List Items Addressed This Visit      Respiratory   Allergic rhinitis - Primary    Increase allergy regimen with singulair in addition to the zyrtec and flonase she consistently uses. F/u if sxs not improving       Other Visit Diagnoses    Cough       Suspect from poorly controlled allergies. Add singulair to current allergy regimen, continue mucinex. Call back if not feeling any better       Follow up plan: Return for as scheduled.

## 2019-04-23 NOTE — Assessment & Plan Note (Signed)
Increase allergy regimen with singulair in addition to the zyrtec and flonase she consistently uses. F/u if sxs not improving

## 2019-05-05 ENCOUNTER — Encounter: Payer: Self-pay | Admitting: Nurse Practitioner

## 2019-05-05 ENCOUNTER — Other Ambulatory Visit: Payer: Self-pay

## 2019-05-05 ENCOUNTER — Ambulatory Visit (INDEPENDENT_AMBULATORY_CARE_PROVIDER_SITE_OTHER): Payer: Medicare Other | Admitting: Nurse Practitioner

## 2019-05-05 VITALS — BP 125/70 | Temp 95.5°F | Ht 61.0 in | Wt 195.0 lb

## 2019-05-05 DIAGNOSIS — R05 Cough: Secondary | ICD-10-CM | POA: Diagnosis not present

## 2019-05-05 DIAGNOSIS — R059 Cough, unspecified: Secondary | ICD-10-CM

## 2019-05-05 MED ORDER — BENZONATATE 100 MG PO CAPS: 100.0000 mg | ORAL_CAPSULE | Freq: Three times a day (TID) | ORAL | 0 refills | Status: DC | PRN

## 2019-05-05 MED ORDER — AZITHROMYCIN 250 MG PO TABS: ORAL_TABLET | ORAL | 0 refills | Status: DC

## 2019-05-05 NOTE — Progress Notes (Signed)
BP 125/70   Temp (!) 95.5 F (35.3 C) (Oral)   Ht 5\' 1"  (1.549 m)   Wt 195 lb (88.5 kg)   LMP  (LMP Unknown)   BMI 36.84 kg/m    Subjective:    Patient ID: Bethany Lara, female    DOB: August 03, 1951, 68 y.o.   MRN: [id number]  HPI: Bethany Lara is a 68 y.o. female  Chief Complaint  Patient presents with  . Cough    pt states cough for about a month and chest congestion for 2 days. tried OTC robitussin and mucinex. not helping  . Loss of taste    loss of smell     . This visit was completed via telephone due to the restrictions of the COVID-19 pandemic. All issues as above were discussed and addressed but no physical exam was performed. If it was felt that the patient should be evaluated in the office, they were directed there. The patient verbally consented to this visit. Patient was unable to complete an audio/visual visit due to Lack of equipment. Due to the catastrophic nature of the COVID-19 pandemic, this visit was done through audio contact only. . Location of the patient: home . Location of the provider: work . Those involved with this call:  . Provider: Marnee Guarneri, DNP . CMA: Lesle Chris, [place] . Front Desk/Registration: Jill Side  . Time spent on call: 15 minutes on the phone discussing health concerns. 10 minutes total spent in review of patient's record and preparation of their chart.   UPPER RESPIRATORY TRACT INFECTION Has had the cough with chest congestion on and off for over one month per her report, which she thought was related to allergies.  On weekend she lost her sense smell and taste.  Has also had nasal congestion.  Denies SOB or fever.  Denies recent exposure to Covid positive person or travel overseas.  Is following guidelines when out in public. Fever: no Cough: yes, productive clearish Shortness of breath: no Wheezing: no Chest pain: no Chest tightness: no Chest congestion: no Nasal congestion: yes Runny nose: no Post nasal  drip: no Sneezing: no Sore throat: no Swollen glands: no Sinus pressure: no Headache: yes Face pain: no Toothache: no Ear pain: none Ear pressure: none Eyes red/itching:no Eye drainage/crusting: no  Vomiting: no Rash: no Fatigue: yes Sick contacts: no Strep contacts: no  Context: fluctuating Recurrent sinusitis: no Relief with OTC cold/cough medications: yes, notices it is improved at night  Treatments attempted: cold/sinus and cough syrup   Relevant past medical, surgical, family and social history reviewed and updated as indicated. Interim medical history since our last visit reviewed. Allergies and medications reviewed and updated.  Review of Systems  Constitutional: Positive for fatigue. Negative for activity change, appetite change and fever.  HENT: Positive for congestion. Negative for ear discharge, ear pain, facial swelling, postnasal drip, rhinorrhea, sinus pressure, sinus pain, sneezing, sore throat and voice change.   Eyes: Negative for pain and visual disturbance.  Respiratory: Positive for cough. Negative for chest tightness, shortness of breath and wheezing.   Cardiovascular: Negative for chest pain, palpitations and leg swelling.  Gastrointestinal: Negative for abdominal distention, abdominal pain, constipation, diarrhea, nausea and vomiting.  Endocrine: Negative.   Musculoskeletal: Negative for myalgias.  Neurological: Positive for headaches. Negative for dizziness and numbness.  Psychiatric/Behavioral: Negative.     Per HPI unless specifically indicated above     Objective:    BP 125/70   Temp (!) 95.5 F (  35.3 C) (Oral)   Ht 5\' 1"  (1.549 m)   Wt 195 lb (88.5 kg)   LMP  (LMP Unknown)   BMI 36.84 kg/m   Wt Readings from Last 3 Encounters:  05/05/19 195 lb (88.5 kg)  04/23/19 195 lb (88.5 kg)  11/19/18 194 lb (88 kg)    Physical Exam   Unable to perform due to telephone visit only.  Results for orders placed or performed in visit on 11/19/18   Microscopic Examination   URINE  Result Value Ref Range   WBC, UA 0-5 0 - 5 /hpf   RBC, UA None seen 0 - 2 /hpf   Epithelial Cells (non renal) 0-10 0 - 10 /hpf   Bacteria, UA Moderate (A) None seen/Few  Urine Culture, Reflex   URINE  Result Value Ref Range   Urine Culture, Routine Final report    Organism ID, Bacteria Comment   CBC with Differential/Platelet  Result Value Ref Range   WBC 7.9 3.4 - 10.8 x10E3/uL   RBC 4.45 3.77 - 5.28 x10E6/uL   Hemoglobin 13.5 11.1 - 15.9 g/dL   Hematocrit 39.9 34.0 - 46.6 %   MCV 90 79 - 97 fL   MCH 30.3 26.6 - 33.0 pg   MCHC 33.8 31.5 - 35.7 g/dL   RDW 12.9 11.7 - 15.4 %   Platelets 259 150 - 450 x10E3/uL   Neutrophils 61 Not Estab. %   Lymphs 25 Not Estab. %   Monocytes 8 Not Estab. %   Eos 5 Not Estab. %   Basos 1 Not Estab. %   Neutrophils Absolute 4.8 1.4 - 7.0 x10E3/uL   Lymphocytes Absolute 2.0 0.7 - 3.1 x10E3/uL   Monocytes Absolute 0.7 0.1 - 0.9 x10E3/uL   EOS (ABSOLUTE) 0.4 0.0 - 0.4 x10E3/uL   Basophils Absolute 0.1 0.0 - 0.2 x10E3/uL   Immature Granulocytes 0 Not Estab. %   Immature Grans (Abs) 0.0 0.0 - 0.1 x10E3/uL  Comprehensive metabolic panel  Result Value Ref Range   Glucose 108 (H) 65 - 99 mg/dL   BUN 12 8 - 27 mg/dL   Creatinine, Ser 0.82 0.57 - 1.00 mg/dL   GFR calc non Af Amer 73 >59 mL/min/1.73   GFR calc Af Amer 84 >59 mL/min/1.73   BUN/Creatinine Ratio 15 12 - 28   Sodium 139 134 - 144 mmol/L   Potassium 4.0 3.5 - 5.2 mmol/L   Chloride 104 96 - 106 mmol/L   CO2 22 20 - 29 mmol/L   Calcium 9.4 8.7 - 10.3 mg/dL   Total Protein 6.5 6.0 - 8.5 g/dL   Albumin 4.3 3.8 - 4.8 g/dL   Globulin, Total 2.2 1.5 - 4.5 g/dL   Albumin/Globulin Ratio 2.0 1.2 - 2.2   Bilirubin Total 0.5 0.0 - 1.2 mg/dL   Alkaline Phosphatase 69 39 - 117 IU/L   AST 19 0 - 40 IU/L   ALT 21 0 - 32 IU/L  Lipid Panel w/o Chol/HDL Ratio  Result Value Ref Range   Cholesterol, Total 205 (H) 100 - 199 mg/dL   Triglycerides 154 (H) 0 - 149  mg/dL   HDL 61 >39 mg/dL   VLDL Cholesterol Cal 31 5 - 40 mg/dL   LDL Calculated 113 (H) 0 - 99 mg/dL  UA/M w/rflx Culture, Routine   Specimen: Urine   URINE  Result Value Ref Range   Specific Gravity, UA 1.015 1.005 - 1.030   pH, UA 7.0 5.0 - 7.5   Color,  UA Yellow Yellow   Appearance Ur Hazy (A) Clear   Leukocytes, UA 3+ (A) Negative   Protein, UA Negative Negative/Trace   Glucose, UA Negative Negative   Ketones, UA Negative Negative   RBC, UA Negative Negative   Bilirubin, UA Negative Negative   Urobilinogen, Ur 0.2 0.2 - 1.0 mg/dL   Nitrite, UA Negative Negative   Microscopic Examination See below:    Urinalysis Reflex Comment   TSH  Result Value Ref Range   TSH 3.180 0.450 - 4.500 uIU/mL      Assessment & Plan:   Problem List Items Addressed This Visit      Other   Cough - Primary    Acute.  Due to current Covid pandemic and symptoms have recommended, and ordered, Covid testing. To maintain quarantine until testing returned.  Have recommended maintaining a 14 day at home quarantine if Covid testing returns positive, if negative testing then maintain quarantine until symptoms improved.  Patient agrees with this POC.  Script for Azithromycin and Tessalon sent due to ongoing cough symptoms.  They will return in 2 weeks for follow-up or sooner if worsening symptoms.      Relevant Orders   Novel Coronavirus, NAA ([facility])       Follow up plan: Return in about 2 weeks (around 05/19/2019) for Covid follow-up.

## 2019-05-05 NOTE — Patient Instructions (Signed)

## 2019-05-05 NOTE — Assessment & Plan Note (Signed)
Acute.  Due to current Covid pandemic and symptoms have recommended, and ordered, Covid testing. To maintain quarantine until testing returned.  Have recommended maintaining a 14 day at home quarantine if Covid testing returns positive, if negative testing then maintain quarantine until symptoms improved.  Patient agrees with this POC.  Script for Azithromycin and Tessalon sent due to ongoing cough symptoms.  They will return in 2 weeks for follow-up or sooner if worsening symptoms.

## 2019-05-06 ENCOUNTER — Other Ambulatory Visit: Payer: Self-pay

## 2019-05-06 DIAGNOSIS — Z20822 Contact with and (suspected) exposure to covid-19: Secondary | ICD-10-CM

## 2019-05-07 LAB — NOVEL CORONAVIRUS, NAA: SARS-CoV-2, NAA: NOT DETECTED

## 2019-05-20 ENCOUNTER — Other Ambulatory Visit: Payer: Self-pay

## 2019-05-20 ENCOUNTER — Ambulatory Visit (INDEPENDENT_AMBULATORY_CARE_PROVIDER_SITE_OTHER): Payer: Medicare Other | Admitting: Family Medicine

## 2019-05-20 ENCOUNTER — Encounter: Payer: Self-pay | Admitting: Family Medicine

## 2019-05-20 VITALS — BP 122/77 | HR 88 | Temp 97.3°F | Ht 61.0 in | Wt 196.0 lb

## 2019-05-20 DIAGNOSIS — J309 Allergic rhinitis, unspecified: Secondary | ICD-10-CM | POA: Diagnosis not present

## 2019-05-20 DIAGNOSIS — Z8673 Personal history of transient ischemic attack (TIA), and cerebral infarction without residual deficits: Secondary | ICD-10-CM | POA: Diagnosis not present

## 2019-05-20 DIAGNOSIS — R7301 Impaired fasting glucose: Secondary | ICD-10-CM

## 2019-05-20 DIAGNOSIS — R05 Cough: Secondary | ICD-10-CM

## 2019-05-20 DIAGNOSIS — E7849 Other hyperlipidemia: Secondary | ICD-10-CM | POA: Diagnosis not present

## 2019-05-20 DIAGNOSIS — R059 Cough, unspecified: Secondary | ICD-10-CM

## 2019-05-20 MED ORDER — CETIRIZINE HCL 10 MG PO TABS: 10.0000 mg | ORAL_TABLET | Freq: Every day | ORAL | 1 refills | Status: AC

## 2019-05-20 MED ORDER — CLOPIDOGREL BISULFATE 75 MG PO TABS: 75.0000 mg | ORAL_TABLET | Freq: Every day | ORAL | 1 refills | Status: DC

## 2019-05-20 NOTE — Progress Notes (Signed)
BP 122/77   Pulse 88   Temp (!) 97.3 F (36.3 C) (Oral)   Ht 5\' 1"  (1.549 m)   Wt 196 lb (88.9 kg)   LMP  (LMP Unknown)   BMI 37.03 kg/m    Subjective:    Patient ID: Bethany Lara, female    DOB: October 31, 1950, 68 y.o.   MRN: [id number]  HPI: Bethany Lara is a 68 y.o. female  Chief Complaint  Patient presents with  . Hyperlipidemia    18m f/u  . IFG  . Cough    . This visit was completed via telephone due to the restrictions of the COVID-19 pandemic. All issues as above were discussed and addressed. Physical exam was done as above through visual confirmation on telephone. If it was felt that the patient should be evaluated in the office, they were directed there. The patient verbally consented to this visit. . Location of the patient: home . Location of the provider: work . Those involved with this call:  . Provider: Merrie Roof, PA-C . CMA: Tiffany Reel, CMA . Front Desk/Registration: Jill Side  . Time spent on call: 25 minutes with patient face to face via video conference. More than 50% of this time was spent in counseling and coordination of care. 5 minutes total spent in review of patient's record and preparation of their chart. I verified patient identity using two factors (patient name and date of birth). Patient consents verbally to being seen via telemedicine visit today.   Patient presenting today for 6 month f/u chronic conditions.   Recently treated for URI with azithromycin and tessalon perles. Feeling a fair amount better but still having some post nasal drip and cough. Taking zyrtec, flonase, singulair daily for her allergic rhinitis. Denies fevers, chills, CP, SOB, sick contacts, recent travel.   Hx of carpal tunnel surgery on left hand, now considering surgery on the right. Has appt coming up with Orthopedics. Using wrist brace at night and ice packs. Also using diclofenac gel prn which seems to help.   HLD - taking crestor, tolerating well. No  CP, SOB, claudication, myalgias. Trying to stay active and eat well.  IFG - Diet controlled. No low blood sugar spells.   Hx of CVA. On aspirin and plavix. Followed by Neurology. No new sxs.   Relevant past medical, surgical, family and social history reviewed and updated as indicated. Interim medical history since our last visit reviewed. Allergies and medications reviewed and updated.  Review of Systems  Per HPI unless specifically indicated above     Objective:    BP 122/77   Pulse 88   Temp (!) 97.3 F (36.3 C) (Oral)   Ht 5\' 1"  (1.549 m)   Wt 196 lb (88.9 kg)   LMP  (LMP Unknown)   BMI 37.03 kg/m   Wt Readings from Last 3 Encounters:  05/20/19 196 lb (88.9 kg)  05/05/19 195 lb (88.5 kg)  04/23/19 195 lb (88.5 kg)    Physical Exam  Unable to perform PE due to patient lack of access to video technology for today's visit.   Results for orders placed or performed in visit on 05/06/19  Novel Coronavirus, NAA ([facility])   Specimen: Oropharyngeal(OP) collection in vial transport medium   OROPHARYNGEA  TESTING  Result Value Ref Range   SARS-CoV-2, NAA Not Detected Not Detected      Assessment & Plan:   Problem List Items Addressed This Visit      Respiratory  Allergic rhinitis    Not under good control despite excellent compliance with allergy regimen. Suspect this is the cause of her ongoing upper respiratory sxs. Continue regimen and consider ENT referral in future if still not resolving        Endocrine   IFG (impaired fasting glucose)    Diet controlled. Recheck A1C, continue lifestyle modifications. ADjust as needed      Relevant Orders   HgB A1c     Other   Hyperlipidemia - Primary    Recheck lipids, adjust as needed. Continue current regimen      Relevant Orders   Comprehensive metabolic panel   Lipid Panel w/o Chol/HDL Ratio   History of CVA (cerebrovascular accident)    No new issues. Followed by Neurology, continue per their recommendations       Cough    Improved some with abx. Continue allergy regimen and tessalon prn. F/u if worsening          Follow up plan: Return in about 6 months (around 11/20/2019) for CPE, AWV with [facility].

## 2019-05-24 NOTE — Assessment & Plan Note (Signed)
Improved some with abx. Continue allergy regimen and tessalon prn. F/u if worsening

## 2019-05-24 NOTE — Assessment & Plan Note (Signed)
Diet controlled. Recheck A1C, continue lifestyle modifications. ADjust as needed

## 2019-05-24 NOTE — Assessment & Plan Note (Signed)
Recheck lipids, adjust as needed. Continue current regimen 

## 2019-05-24 NOTE — Assessment & Plan Note (Addendum)
Not under good control despite excellent compliance with allergy regimen. Suspect this is the cause of her ongoing upper respiratory sxs. Continue regimen and consider ENT referral in future if still not resolving

## 2019-05-24 NOTE — Assessment & Plan Note (Signed)
No new issues. Followed by Neurology, continue per their recommendations

## 2019-06-05 ENCOUNTER — Other Ambulatory Visit: Payer: Medicare Other

## 2019-06-05 ENCOUNTER — Other Ambulatory Visit: Payer: Self-pay

## 2019-06-05 DIAGNOSIS — E7849 Other hyperlipidemia: Secondary | ICD-10-CM

## 2019-06-05 DIAGNOSIS — R7301 Impaired fasting glucose: Secondary | ICD-10-CM

## 2019-06-06 LAB — COMPREHENSIVE METABOLIC PANEL
ALT: 20 IU/L (ref 0–32)
AST: 18 IU/L (ref 0–40)
Albumin/Globulin Ratio: 2 (ref 1.2–2.2)
Albumin: 4.3 g/dL (ref 3.8–4.8)
Alkaline Phosphatase: 102 IU/L (ref 39–117)
BUN/Creatinine Ratio: 14 (ref 12–28)
BUN: 11 mg/dL (ref 8–27)
Bilirubin Total: 0.5 mg/dL (ref 0.0–1.2)
CO2: 23 mmol/L (ref 20–29)
Calcium: 9.5 mg/dL (ref 8.7–10.3)
Chloride: 103 mmol/L (ref 96–106)
Creatinine, Ser: 0.79 mg/dL (ref 0.57–1.00)
GFR calc Af Amer: 88 mL/min/{1.73_m2} (ref >59)
GFR calc non Af Amer: 76 mL/min/{1.73_m2} (ref >59)
Globulin, Total: 2.1 g/dL (ref 1.5–4.5)
Glucose: 112 mg/dL — ABNORMAL HIGH (ref 65–99)
Potassium: 4.5 mmol/L (ref 3.5–5.2)
Sodium: 139 mmol/L (ref 134–144)
Total Protein: 6.4 g/dL (ref 6.0–8.5)

## 2019-06-06 LAB — HEMOGLOBIN A1C
Est. average glucose Bld gHb Est-mCnc: 123 mg/dL
Hgb A1c MFr Bld: 5.9 % — ABNORMAL HIGH (ref 4.8–5.6)

## 2019-06-06 LAB — LIPID PANEL W/O CHOL/HDL RATIO
Cholesterol, Total: 198 mg/dL (ref 100–199)
HDL: 46 mg/dL (ref >39)
LDL Chol Calc (NIH): 99 mg/dL (ref 0–99)
Triglycerides: 312 mg/dL — ABNORMAL HIGH (ref 0–149)
VLDL Cholesterol Cal: 53 mg/dL — ABNORMAL HIGH (ref 5–40)

## 2019-06-17 HISTORY — PX: CARPAL TUNNEL RELEASE: SHX101

## 2019-06-22 ENCOUNTER — Other Ambulatory Visit: Payer: Self-pay | Admitting: Family Medicine

## 2019-06-22 NOTE — Telephone Encounter (Signed)
Routing to provider  

## 2019-06-22 NOTE — Telephone Encounter (Signed)
Requested medication (s) are due for refill today: yes  Requested medication (s) are on the active medication list: yes  Last refill:  06/22/2019  Future visit scheduled: yes  Notes to clinic:  Requesting 1 year supply   Requested Prescriptions  Pending Prescriptions Disp Refills   rosuvastatin (CRESTOR) 10 MG tablet [Pharmacy Med Name: ROSUVASTATIN  10MG   TAB] 90 tablet 3    Sig: TAKE 1 TABLET BY MOUTH  DAILY     Cardiovascular:  Antilipid - Statins Failed - 06/22/2019 10:40 AM      Failed - LDL in normal range and within 360 days    LDL Calculated  Date Value Ref Range Status  11/19/2018 113 (H) 0 - 99 mg/dL Final         Failed - Triglycerides in normal range and within 360 days    Triglycerides  Date Value Ref Range Status  06/05/2019 312 (H) 0 - 149 mg/dL Final   Triglycerides Piccolo,Waived  Date Value Ref Range Status  01/21/2017 144 <150 mg/dL Final    Comment:                            Normal                   <150                         Borderline High     150 - 199                         High                200 - 499                         Very High                >499          Passed - Total Cholesterol in normal range and within 360 days    Cholesterol, Total  Date Value Ref Range Status  06/05/2019 198 100 - 199 mg/dL Final   Cholesterol Piccolo, Waived  Date Value Ref Range Status  01/21/2017 184 <200 mg/dL Final    Comment:                            Desirable                <200                         Borderline High      200- 239                         High                     >239          Passed - HDL in normal range and within 360 days    HDL  Date Value Ref Range Status  06/05/2019 46 >39 mg/dL Final         Passed - Patient is not pregnant      Passed - Valid encounter within last 12 months    Recent Outpatient Visits  1 month ago Other hyperlipidemia   [place], [place], [place]   1  month ago Cough   [place], [place] T, NP   2 months ago Allergic rhinitis, unspecified seasonality, unspecified trigger   [place], [place], [place]   2 months ago Acute maxillary sinusitis, recurrence not specified   [place], [place], [place]   7 months ago Obstructive sleep apnea syndrome   [place], Lilia Argue, [place]      Future Appointments            In 5 months Orene Desanctis, Lilia Argue, [place], [place]

## 2019-06-29 ENCOUNTER — Telehealth: Payer: Self-pay | Admitting: Family Medicine

## 2019-06-29 MED ORDER — FENOFIBRATE 160 MG PO TABS: 160.0000 mg | ORAL_TABLET | Freq: Every day | ORAL | 1 refills | Status: DC

## 2019-06-29 MED ORDER — PRAVASTATIN SODIUM 40 MG PO TABS: 40.0000 mg | ORAL_TABLET | Freq: Every day | ORAL | 1 refills | Status: DC

## 2019-06-29 NOTE — Telephone Encounter (Signed)
I have d/c'd crestor and put it on intolerance list. I also re-sent the two listed that she is taking now instead so she would have enough  Copied from [place] [phone]. Topic: General - Other >> Jun 29, 2019 10:42 AM Yvette Rack wrote: Reason for CRM: Pt stated she is unable to take the rosuvastatin (CRESTOR) 10 MG tablet due to it causing lower back pain and body aches. Pt stated she went back to her old medications (fenofibrate 160 MG tablet and pravastatin (PRAVACHOL) 40 MG tablet). Pt requests that Apolonio Schneiders or her assistant return her call.

## 2019-06-29 NOTE — Telephone Encounter (Signed)
Crestor on intolerance/allergy list.   Patient notified of medication being resent. Patient stated it needed to be sent to [facility]. Rx's were [facility] but explained to [facility] to [facility] and get them to transfer medication. Patient verbalized understanding.

## 2019-07-15 ENCOUNTER — Other Ambulatory Visit: Payer: Self-pay | Admitting: Family Medicine

## 2019-07-15 MED ORDER — FENOFIBRATE 160 MG PO TABS: 160.0000 mg | ORAL_TABLET | Freq: Every day | ORAL | 1 refills | Status: DC

## 2019-07-15 MED ORDER — PRAVASTATIN SODIUM 40 MG PO TABS: 40.0000 mg | ORAL_TABLET | Freq: Every day | ORAL | 1 refills | Status: DC

## 2019-07-15 NOTE — Telephone Encounter (Signed)
Medication Refill - Medication:  fenofibrate 160 MG tablet [id number]  pravastatin (PRAVACHOL) 40 MG tablet [id number] She needs a 90 day supply due to ins    Has the patient contacted their pharmacy? No. (Agent: If no, request that the patient contact the pharmacy for the refill.) (Agent: If yes, when and what did the pharmacy advise?)  Preferred Pharmacy (with phone number or street name): [facility]  Fax number [phone]   Agent: Please be advised that RX refills may take up to 3 business days. We ask that you follow-up with your pharmacy.

## 2019-07-27 ENCOUNTER — Ambulatory Visit (INDEPENDENT_AMBULATORY_CARE_PROVIDER_SITE_OTHER): Payer: Medicare Other

## 2019-07-27 VITALS — BP 121/67 | HR 61 | Temp 97.3°F | Ht 61.0 in | Wt 184.0 lb

## 2019-07-27 DIAGNOSIS — Z0000 Encounter for general adult medical examination without abnormal findings: Secondary | ICD-10-CM | POA: Diagnosis not present

## 2019-07-27 NOTE — Patient Instructions (Signed)
Bethany Lara , Thank you for taking time to come for your Medicare Wellness Visit. I appreciate your ongoing commitment to your health goals. Please review the following plan we discussed and let me know if I can assist you in the future.   Screening recommendations/referrals: Colonoscopy: cologuard completed 11/26/2018 Mammogram: Please call [phone] to schedule your mammogram.  Bone Density: up to date  Recommended yearly ophthalmology/optometry visit for glaucoma screening and checkup Recommended yearly dental visit for hygiene and checkup  Vaccinations: Influenza vaccine: due now  Pneumococcal vaccine: up to date  Tdap vaccine: due now Shingles vaccine: shingrix eligible     Advanced directives: Please bring a copy of your health care power of attorney and living will to the office at your convenience.  Conditions/risks identified: keep up the great work with your weight loss!  Next appointment: Follow up in one year for your annual wellness visit.    Preventive Care 35 Years and Older, Female Preventive care refers to lifestyle choices and visits with your health care provider that can promote health and wellness. What does preventive care include?  A yearly physical exam. This is also called an annual well check.  Dental exams once or twice a year.  Routine eye exams. Ask your health care provider how often you should have your eyes checked.  Personal lifestyle choices, including:  Daily care of your teeth and gums.  Regular physical activity.  Eating a healthy diet.  Avoiding tobacco and drug use.  Limiting alcohol use.  Practicing safe sex.  Taking low-dose aspirin every day.  Taking vitamin and mineral supplements as recommended by your health care provider. What happens during an annual well check? The services and screenings done by your health care provider during your annual well check will depend on your age, overall health, lifestyle risk factors,  and family history of disease. Counseling  Your health care provider may ask you questions about your:  Alcohol use.  Tobacco use.  Drug use.  Emotional well-being.  Home and relationship well-being.  Sexual activity.  Eating habits.  History of falls.  Memory and ability to understand (cognition).  Work and work Statistician.  Reproductive health. Screening  You may have the following tests or measurements:  Height, weight, and BMI.  Blood pressure.  Lipid and cholesterol levels. These may be checked every 5 years, or more frequently if you are over 34 years old.  Skin check.  Lung cancer screening. You may have this screening every year starting at age 28 if you have a 30-pack-year history of smoking and currently smoke or have quit within the past 15 years.  Fecal occult blood test (FOBT) of the stool. You may have this test every year starting at age 69.  Flexible sigmoidoscopy or colonoscopy. You may have a sigmoidoscopy every 5 years or a colonoscopy every 10 years starting at age 27.  Hepatitis C blood test.  Hepatitis B blood test.  Sexually transmitted disease (STD) testing.  Diabetes screening. This is done by checking your blood sugar (glucose) after you have not eaten for a while (fasting). You may have this done every 1-3 years.  Bone density scan. This is done to screen for osteoporosis. You may have this done starting at age 52.  Mammogram. This may be done every 1-2 years. Talk to your health care provider about how often you should have regular mammograms. Talk with your health care provider about your test results, treatment options, and if necessary, the need for  more tests. Vaccines  Your health care provider may recommend certain vaccines, such as:  Influenza vaccine. This is recommended every year.  Tetanus, diphtheria, and acellular pertussis (Tdap, Td) vaccine. You may need a Td booster every 10 years.  Zoster vaccine. You may need  this after age 97.  Pneumococcal 13-valent conjugate (PCV13) vaccine. One dose is recommended after age 52.  Pneumococcal polysaccharide (PPSV23) vaccine. One dose is recommended after age 74. Talk to your health care provider about which screenings and vaccines you need and how often you need them. This information is not intended to replace advice given to you by your health care provider. Make sure you discuss any questions you have with your health care provider. Document Released: 10/07/2015 Document Revised: 05/30/2016 Document Reviewed: 07/12/2015 Elsevier Interactive Patient Education  2017 [employer] Prevention in the Home Falls can cause injuries. They can happen to people of all ages. There are many things you can do to make your home safe and to help prevent falls. What can I do on the outside of my home?  Regularly fix the edges of walkways and driveways and fix any cracks.  Remove anything that might make you trip as you walk through a door, such as a raised step or threshold.  Trim any bushes or trees on the path to your home.  Use bright outdoor lighting.  Clear any walking paths of anything that might make someone trip, such as rocks or tools.  Regularly check to see if handrails are loose or broken. Make sure that both sides of any steps have handrails.  Any raised decks and porches should have guardrails on the edges.  Have any leaves, snow, or ice cleared regularly.  Use sand or salt on walking paths during winter.  Clean up any spills in your garage right away. This includes oil or grease spills. What can I do in the bathroom?  Use night lights.  Install grab bars by the toilet and in the tub and shower. Do not use towel bars as grab bars.  Use non-skid mats or decals in the tub or shower.  If you need to sit down in the shower, use a plastic, non-slip stool.  Keep the floor dry. Clean up any water that spills on the floor as soon as it  happens.  Remove soap buildup in the tub or shower regularly.  Attach bath mats securely with double-sided non-slip rug tape.  Do not have throw rugs and other things on the floor that can make you trip. What can I do in the bedroom?  Use night lights.  Make sure that you have a light by your bed that is easy to reach.  Do not use any sheets or blankets that are too big for your bed. They should not hang down onto the floor.  Have a firm chair that has side arms. You can use this for support while you get dressed.  Do not have throw rugs and other things on the floor that can make you trip. What can I do in the kitchen?  Clean up any spills right away.  Avoid walking on wet floors.  Keep items that you use a lot in easy-to-reach places.  If you need to reach something above you, use a strong step stool that has a grab bar.  Keep electrical cords out of the way.  Do not use floor polish or wax that makes floors slippery. If you must use wax, use non-skid  floor wax.  Do not have throw rugs and other things on the floor that can make you trip. What can I do with my stairs?  Do not leave any items on the stairs.  Make sure that there are handrails on both sides of the stairs and use them. Fix handrails that are broken or loose. Make sure that handrails are as long as the stairways.  Check any carpeting to make sure that it is firmly attached to the stairs. Fix any carpet that is loose or worn.  Avoid having throw rugs at the top or bottom of the stairs. If you do have throw rugs, attach them to the floor with carpet tape.  Make sure that you have a light switch at the top of the stairs and the bottom of the stairs. If you do not have them, ask someone to add them for you. What else can I do to help prevent falls?  Wear shoes that:  Do not have high heels.  Have rubber bottoms.  Are comfortable and fit you well.  Are closed at the toe. Do not wear sandals.  If you  use a stepladder:  Make sure that it is fully opened. Do not climb a closed stepladder.  Make sure that both sides of the stepladder are locked into place.  Ask someone to hold it for you, if possible.  Clearly mark and make sure that you can see:  Any grab bars or handrails.  First and last steps.  Where the edge of each step is.  Use tools that help you move around (mobility aids) if they are needed. These include:  Canes.  Walkers.  Scooters.  Crutches.  Turn on the lights when you go into a dark area. Replace any light bulbs as soon as they burn out.  Set up your furniture so you have a clear path. Avoid moving your furniture around.  If any of your floors are uneven, fix them.  If there are any pets around you, be aware of where they are.  Review your medicines with your doctor. Some medicines can make you feel dizzy. This can increase your chance of falling. Ask your doctor what other things that you can do to help prevent falls. This information is not intended to replace advice given to you by your health care provider. Make sure you discuss any questions you have with your health care provider. Document Released: 07/07/2009 Document Revised: 02/16/2016 Document Reviewed: 10/15/2014 Elsevier Interactive Patient Education  2017 [employer].

## 2019-07-27 NOTE — Progress Notes (Signed)
Subjective:   Bethany Lara is a 68 y.o. female who presents for Medicare Annual (Subsequent) preventive examination.  This visit is being conducted via phone call  - after an attmept to do on video chat - due to the COVID-19 pandemic. This patient has given me verbal consent via phone to conduct this visit, patient states they are participating from their home address. Some vital signs may be absent or patient reported.   Patient identification: identified by name, DOB, and current address.    Review of Systems:   Cardiac Risk Factors include: advanced age (>74men, >4 women);hypertension;dyslipidemia     Objective:     Vitals: BP 121/67 Comment: pt reported  Pulse 61 Comment: pt reported  Temp (!) 97.3 F (36.3 C) Comment: pt reported  Ht 5\' 1"  (1.549 m)   Wt 184 lb (83.5 kg) Comment: pt reported  LMP  (LMP Unknown)   BMI 34.77 kg/m   Body mass index is 34.77 kg/m.  Advanced Directives 07/21/2018 10/30/2017 10/26/2017 10/26/2017 07/18/2017 07/17/2017 05/20/2017  Does Patient Have a Medical Advance Directive? Yes - Yes Yes Yes Yes Yes  Type of Paramedic of [place];Living will [place];Living will [place];Living will - [place];Living will [place];Living will Living will  Does patient want to make changes to medical advance directive? - - No - Patient declined - No - Patient declined - No - Patient declined  Copy of [place] in Chart? No - copy requested No - copy requested No - copy requested - No - copy requested No - copy requested -    Tobacco Social History   Tobacco Use  Smoking Status Never Smoker  Smokeless Tobacco Never Used     Counseling given: Not Answered   Clinical Intake:  Pre-visit preparation completed: Yes  Pain : No/denies pain     Nutritional Status: BMI > 30  Obese Nutritional Risks: None Diabetes: No  How often  do you need to have someone help you when you read instructions, pamphlets, or other written materials from your doctor or pharmacy?: 1 - Never  Interpreter Needed?: No  Information entered by :: Tekila Caillouet,LPN  Past Medical History:  Diagnosis Date  . Allergy   . Arrhythmia   . Carpal tunnel syndrome   . Hyperlipidemia    Past Surgical History:  Procedure Laterality Date  . BUNIONECTOMY    . CARPAL TUNNEL RELEASE  11/14/2015  . CARPAL TUNNEL RELEASE Right 06/17/2019  . CHOLECYSTECTOMY    . EYELID LACERATION REPAIR     Eye Lid Surgery  . PEG PLACEMENT N/A 10/30/2017   Procedure: PERCUTANEOUS ENDOSCOPIC GASTROSTOMY (PEG) PLACEMENT;  Surgeon: Lin Landsman, MD;  Location: [facility] ENDOSCOPY;  Service: Gastroenterology;  Laterality: N/A;   Family History  Problem Relation Age of Onset  . Diabetes Mother   . Hypertension Mother   . Dementia Mother   . Heart murmur Mother   . Diabetes Father   . Heart disease Father   . Alzheimer's disease Father   . Glaucoma Father   . Colon cancer Father   . Heart attack Father   . Breast cancer Maternal Aunt    Social History   Socioeconomic History  . Marital status: Married    Spouse name: Not on file  . Number of children: Not on file  . Years of education: 12th grade  . Highest education level: Not on file  Occupational History  . Occupation: retired  Scientific laboratory technician  . Financial resource strain: Not hard at all  . Food insecurity    Worry: Never true    Inability: Never true  . Transportation needs    Medical: No    Non-medical: No  Tobacco Use  . Smoking status: Never Smoker  . Smokeless tobacco: Never Used  Substance and Sexual Activity  . Alcohol use: No    Alcohol/week: 0.0 standard drinks  . Drug use: No  . Sexual activity: Yes  Lifestyle  . Physical activity    Days per week: 0 days    Minutes per session: 0 min  . Stress: Patient refused  Relationships  . Social connections    Talks on phone: More than  three times a week    Gets together: More than three times a week    Attends religious service: Patient refused    Active member of club or organization: Patient refused    Attends meetings of clubs or organizations: Patient refused    Relationship status: Married  Other Topics Concern  . Not on file  Social History Narrative  . Not on file    Outpatient Encounter Medications as of 07/27/2019  Medication Sig  . cetirizine (ZYRTEC) 10 MG tablet Take 1 tablet (10 mg total) by mouth daily.  . clopidogrel (PLAVIX) 75 MG tablet Take 1 tablet (75 mg total) by mouth daily.  . diclofenac sodium (VOLTAREN) 1 % GEL Apply 2 g topically 4 (four) times daily.  . fenofibrate 160 MG tablet Take 1 tablet (160 mg total) by mouth daily.  . fluticasone (FLONASE) 50 MCG/ACT nasal spray Place 2 sprays into both nostrils 2 (two) times daily.  . Multiple Vitamin (MULTIVITAMIN) tablet Place 1 tablet into feeding tube daily. (Patient taking differently: Take 1 tablet by mouth daily. )  . NON FORMULARY CPAP @@ bedtime.  . pravastatin (PRAVACHOL) 40 MG tablet Take 1 tablet (40 mg total) by mouth daily.  . Vitamin D, Cholecalciferol, 1000 units TABS Give 2,000 Units by tube daily. (Patient taking differently: Take 2,000 Units by mouth daily. )  . [DISCONTINUED] benzonatate (TESSALON PERLES) 100 MG capsule Take 1 capsule (100 mg total) by mouth 3 (three) times daily as needed for cough. (Patient not taking: Reported on 07/27/2019)  . [DISCONTINUED] montelukast (SINGULAIR) 10 MG tablet Take 1 tablet (10 mg total) by mouth at bedtime. (Patient not taking: Reported on 07/27/2019)   No facility-administered encounter medications on file as of 07/27/2019.     Activities of Daily Living In your present state of health, do you have any difficulty performing the following activities: 07/27/2019 11/19/2018  Hearing? N N  Vision? N N  Comment eyeglasses, goes to [facility] -  Difficulty concentrating or making  decisions? N N  Walking or climbing stairs? N N  Dressing or bathing? N N  Doing errands, shopping? N N  Preparing Food and eating ? N -  Using the Toilet? N -  In the past six months, have you accidently leaked urine? N -  Do you have problems with loss of bowel control? N -  Managing your Medications? N -  Managing your Finances? N -  Housekeeping or managing your Housekeeping? N -  Some recent data might be hidden    Patient Care Team: Volney American, PA-C as PCP - General (Family Medicine) Kathrine Haddock, NP as PCP - Family Medicine (Nurse Practitioner) Greg Cutter, LCSW as [place]  Care Management (Licensed Clinical Social Worker)    Assessment:   This is a routine wellness examination for [place].  Exercise Activities and Dietary recommendations Current Exercise Habits: The patient does not participate in regular exercise at present, Exercise limited by: None identified  Goals    . Increase water intake     Recommend drinking at least 7-8 glasses of water a day        Fall Risk: Fall Risk  07/27/2019 04/06/2019 07/21/2018 05/14/2018 07/17/2017  Falls in the past year? 0 0 Yes No No  Number falls in past yr: 0 0 1 - -  Injury with Fall? 0 0 Yes - -  Follow up - - Falls evaluation completed;Falls prevention discussed - -    FALL RISK PREVENTION PERTAINING TO THE HOME:  Any stairs in or around the home? Yes  If so, are there any without handrails? No   Home free of loose throw rugs in walkways, pet beds, electrical cords, etc? Yes  Adequate lighting in your home to reduce risk of falls? Yes   ASSISTIVE DEVICES UTILIZED TO PREVENT FALLS:  Life alert? No  Use of a cane, walker or w/c? No  Grab bars in the bathroom? No  Shower chair or bench in shower? No  Elevated toilet seat or a handicapped toilet? No   DME ORDERS:  DME order needed?  No   TIMED UP AND GO:  Unable to perform   Depression Screen PHQ 2/9 Scores 07/27/2019  11/19/2018 07/21/2018 05/14/2018  PHQ - 2 Score 0 0 0 0  PHQ- 9 Score - 2 - -     Cognitive Function     6CIT Screen 07/21/2018 07/17/2017  What Year? 0 points 0 points  What month? 0 points 0 points  What time? 0 points 0 points  Count back from 20 0 points 0 points  Months in reverse 0 points 0 points  Repeat phrase 2 points 2 points  Total Score 2 2    Immunization History  Administered Date(s) Administered  . Influenza, High Dose Seasonal PF 07/23/2016  . Influenza,inj,Quad PF,6+ Mos 11/28/2015  . Pneumococcal Conjugate-13 07/13/2014  . Pneumococcal Polysaccharide-23 01/20/2016  . Td 11/20/2005  . Zoster 05/09/2010    Qualifies for Shingles Vaccine? Yes  Zostavax completed 2011. Due for Shingrix. Education has been provided regarding the importance of this vaccine. Pt has been advised to call insurance company to determine out of pocket expense. Advised may also receive vaccine at local pharmacy or Health Dept. Verbalized acceptance and understanding.  Tdap: Discussed need for TD/TDAP vaccine, patient verbalized understanding that this is not covered as a preventative with there insurance and to call the office if she develops any new skin injuries, ie: cuts, scrapes, bug bites, or open wounds.  Flu Vaccine: Due for Flu vaccine.   Pneumococcal Vaccine: up to date   Screening Tests Health Maintenance  Topic Date Due  . INFLUENZA VACCINE  04/25/2019  . MAMMOGRAM  06/18/2019  . TETANUS/TDAP  07/26/2020 (Originally 11/21/2015)  . Fecal DNA (Cologuard)  11/25/2021  . DEXA SCAN  Completed  . Hepatitis C Screening  Completed  . PNA vac Low Risk Adult  Completed    Cancer Screenings:  Colorectal Screening: patient states she completed this year, will pull results from [facility].  Completed cologuard 11/26/2018, negative results good for 3 years. Placed result to be signed off by pcp   Mammogram: ordered. Patient will call and schedule   Bone Density: Completed  2015.   Lung Cancer Screening: (Low Dose CT Chest recommended if Age 79-80 years, 30 pack-year currently smoking OR have quit w/in 15years.) does not qualify.    Additional Screening:  Hepatitis C Screening: does qualify; Completed 2016  Vision Screening: Recommended annual ophthalmology exams for early detection of glaucoma and other disorders of the eye. Is the patient up to date with their annual eye exam?  Yes  Who is the provider or what is the name of the office in which the pt attends annual eye exams? [facility]   Dental Screening: Recommended annual dental exams for proper oral hygiene  Community Resource Referral:  CRR required this visit?  No       Plan:  I have personally reviewed and addressed the Medicare Annual Wellness questionnaire and have noted the following in the patient's chart:  A. Medical and social history B. Use of alcohol, tobacco or illicit drugs  C. Current medications and supplements D. Functional ability and status E.  Nutritional status F.  Physical activity G. Advance directives H. List of other physicians I.  Hospitalizations, surgeries, and ER visits in previous 12 months J.  [place] such as hearing and vision if needed, cognitive and depression L. Referrals and appointments   In addition, I have reviewed and discussed with patient certain preventive protocols, quality metrics, and best practice recommendations. A written personalized care plan for preventive services as well as general preventive health recommendations were provided to patient.  Signed,    Bevelyn Ngo, LPN  [id number] Nurse Health Advisor   Nurse Notes:none

## 2019-10-13 ENCOUNTER — Other Ambulatory Visit: Payer: Self-pay

## 2019-10-13 NOTE — Telephone Encounter (Signed)
Next OV 11-23-19 Refill request for fenofibrate.

## 2019-10-14 MED ORDER — FENOFIBRATE 160 MG PO TABS: 160.0000 mg | ORAL_TABLET | Freq: Every day | ORAL | 1 refills | Status: DC

## 2019-10-27 ENCOUNTER — Ambulatory Visit
Admission: RE | Admit: 2019-10-27 | Discharge: 2019-10-27 | Disposition: A | Discharge status: Home / Self Care | Payer: Medicare Other | Source: Ambulatory Visit | Attending: Family Medicine | Admitting: Family Medicine

## 2019-10-27 DIAGNOSIS — Z1239 Encounter for other screening for malignant neoplasm of breast: Secondary | ICD-10-CM

## 2019-10-27 DIAGNOSIS — Z1231 Encounter for screening mammogram for malignant neoplasm of breast: Secondary | ICD-10-CM | POA: Diagnosis present

## 2019-11-06 ENCOUNTER — Other Ambulatory Visit: Payer: Self-pay | Admitting: Family Medicine

## 2019-11-23 ENCOUNTER — Other Ambulatory Visit: Payer: Self-pay

## 2019-11-23 ENCOUNTER — Encounter: Payer: Self-pay | Admitting: Family Medicine

## 2019-11-23 ENCOUNTER — Ambulatory Visit (INDEPENDENT_AMBULATORY_CARE_PROVIDER_SITE_OTHER): Payer: Medicare Other | Admitting: Family Medicine

## 2019-11-23 ENCOUNTER — Telehealth: Payer: Self-pay | Admitting: Family Medicine

## 2019-11-23 VITALS — BP 118/73 | HR 66 | Temp 98.0°F | Ht 60.7 in | Wt 183.0 lb

## 2019-11-23 DIAGNOSIS — R7301 Impaired fasting glucose: Secondary | ICD-10-CM | POA: Diagnosis not present

## 2019-11-23 DIAGNOSIS — Z8673 Personal history of transient ischemic attack (TIA), and cerebral infarction without residual deficits: Secondary | ICD-10-CM | POA: Diagnosis not present

## 2019-11-23 DIAGNOSIS — J309 Allergic rhinitis, unspecified: Secondary | ICD-10-CM

## 2019-11-23 DIAGNOSIS — E7849 Other hyperlipidemia: Secondary | ICD-10-CM | POA: Diagnosis not present

## 2019-11-23 DIAGNOSIS — Z0000 Encounter for general adult medical examination without abnormal findings: Secondary | ICD-10-CM | POA: Diagnosis not present

## 2019-11-23 DIAGNOSIS — G4733 Obstructive sleep apnea (adult) (pediatric): Secondary | ICD-10-CM

## 2019-11-23 LAB — UA/M W/RFLX CULTURE, ROUTINE
Bilirubin, UA: NEGATIVE
Glucose, UA: NEGATIVE
Ketones, UA: NEGATIVE
Leukocytes,UA: NEGATIVE
Nitrite, UA: NEGATIVE
Protein,UA: NEGATIVE
RBC, UA: NEGATIVE
Specific Gravity, UA: 1.015 (ref 1.005–1.030)
Urobilinogen, Ur: 0.2 mg/dL (ref 0.2–1.0)
pH, UA: 5 (ref 5.0–7.5)

## 2019-11-23 NOTE — Assessment & Plan Note (Signed)
No new concerns, continue current regimen and monitoring

## 2019-11-23 NOTE — Telephone Encounter (Signed)
Called pt to schedule 6 month f/u no answer no vm

## 2019-11-23 NOTE — Assessment & Plan Note (Signed)
Diet controlled, recheck A1C and continue excellent lifestyle changes

## 2019-11-23 NOTE — Progress Notes (Signed)
BP 118/73   Pulse 66   Temp 98 F (36.7 C) (Oral)   Ht 5' 0.7" (1.542 m)   Wt 183 lb (83 kg)   LMP  (LMP Unknown)   SpO2 98%   BMI 34.92 kg/m    Subjective:    Patient ID: Bethany Lara, female    DOB: [birth year], 69 y.o.   MRN: [id number]  HPI: Bethany Lara is a 69 y.o. female presenting on 11/23/2019 for comprehensive medical examination. Current medical complaints include:see below  HLD - Taking pravastatin without issue. Doing keto diet, down 13 lb since last fall. Exercising regularly. Denies CP, SOB, claudication, myalgias.   Hx of CVA - no new deficits or concerns  OSA - Uses CPAP, which seems very helpful. Needing new equipment  She currently lives with: Menopausal Symptoms: no  Depression Screen done today and results listed below:  Depression screen [facility] 2/9 11/23/2019 07/27/2019 11/19/2018 07/21/2018 05/14/2018  Decreased Interest 0 0 0 0 0  Down, Depressed, Hopeless 0 0 0 0 0  PHQ - 2 Score 0 0 0 0 0  Altered sleeping 1 - 1 - -  Tired, decreased energy 0 - 1 - -  Change in appetite 0 - 0 - -  Feeling bad or failure about yourself  0 - 0 - -  Trouble concentrating 0 - 0 - -  Moving slowly or fidgety/restless 0 - 0 - -  Suicidal thoughts 0 - 0 - -  PHQ-9 Score 1 - 2 - -  Difficult doing work/chores - - - - -    The patient does not have a history of falls. I did complete a risk assessment for falls. A plan of care for falls was documented.   Past Medical History:  Past Medical History:  Diagnosis Date  . Allergy   . Arrhythmia   . Carpal tunnel syndrome   . Hyperlipidemia     Surgical History:  Past Surgical History:  Procedure Laterality Date  . BUNIONECTOMY    . CARPAL TUNNEL RELEASE  11/14/2015  . CARPAL TUNNEL RELEASE Right 06/17/2019  . CHOLECYSTECTOMY    . EYELID LACERATION REPAIR     Eye Lid Surgery  . PEG PLACEMENT N/A 10/30/2017   Procedure: PERCUTANEOUS ENDOSCOPIC GASTROSTOMY (PEG) PLACEMENT;  Surgeon: Lin Landsman, MD;   Location: [facility] ENDOSCOPY;  Service: Gastroenterology;  Laterality: N/A;    Medications:  Current Outpatient Medications on File Prior to Visit  Medication Sig  . cetirizine (ZYRTEC) 10 MG tablet Take 1 tablet (10 mg total) by mouth daily.  . clopidogrel (PLAVIX) 75 MG tablet Take 1 tablet (75 mg total) by mouth daily.  . CO ENZYME Q-10 PO Take by mouth daily.  . diclofenac sodium (VOLTAREN) 1 % GEL Apply 2 g topically 4 (four) times daily.  . fenofibrate 160 MG tablet Take 1 tablet (160 mg total) by mouth daily.  . fluticasone (FLONASE) 50 MCG/ACT nasal spray Place 2 sprays into both nostrils 2 (two) times daily.  Marland Kitchen MAGNESIUM PO Take by mouth daily.  . Multiple Vitamin (MULTIVITAMIN) tablet Place 1 tablet into feeding tube daily. (Patient taking differently: Take 1 tablet by mouth daily. )  . NON FORMULARY CPAP @@ bedtime.  . pravastatin (PRAVACHOL) 40 MG tablet TAKE 1 TABLET BY MOUTH  DAILY  . Vitamin D, Cholecalciferol, 1000 units TABS Give 2,000 Units by tube daily. (Patient taking differently: Take 2,000 Units by mouth daily. )   No current facility-administered medications on  file prior to visit.    Allergies:  Allergies  Allergen Reactions  . Crestor [Rosuvastatin Calcium] Other (See Comments)    myalgias  . Zocor [Simvastatin] Other (See Comments)    Fatigue, legs achey     Social History:  Social History   Socioeconomic History  . Marital status: Married    Spouse name: Not on file  . Number of children: Not on file  . Years of education: 12th grade  . Highest education level: Not on file  Occupational History  . Occupation: retired  Tobacco Use  . Smoking status: Never Smoker  . Smokeless tobacco: Never Used  Substance and Sexual Activity  . Alcohol use: No    Alcohol/week: 0.0 standard drinks  . Drug use: No  . Sexual activity: Yes  Other Topics Concern  . Not on file  Social History Narrative  . Not on file   Social Determinants of Health    Financial Resource Strain:   . Difficulty of Paying Living Expenses: Not on file  Food Insecurity:   . Worried About Charity fundraiser in the Last Year: Not on file  . Ran Out of Food in the Last Year: Not on file  Transportation Needs:   . Lack of Transportation (Medical): Not on file  . Lack of Transportation (Non-Medical): Not on file  Physical Activity:   . Days of Exercise per Week: Not on file  . Minutes of Exercise per Session: Not on file  Stress:   . Feeling of Stress : Not on file  Social Connections:   . Frequency of Communication with Friends and Family: Not on file  . Frequency of Social Gatherings with Friends and Family: Not on file  . Attends Religious Services: Not on file  . Active Member of Clubs or Organizations: Not on file  . Attends Archivist Meetings: Not on file  . Marital Status: Not on file  Intimate Partner Violence:   . Fear of Current or Ex-Partner: Not on file  . Emotionally Abused: Not on file  . Physically Abused: Not on file  . Sexually Abused: Not on file   Social History   Tobacco Use  Smoking Status Never Smoker  Smokeless Tobacco Never Used   Social History   Substance and Sexual Activity  Alcohol Use No  . Alcohol/week: 0.0 standard drinks    Family History:  Family History  Problem Relation Age of Onset  . Diabetes Mother   . Hypertension Mother   . Dementia Mother   . Heart murmur Mother   . Diabetes Father   . Heart disease Father   . Alzheimer's disease Father   . Glaucoma Father   . Colon cancer Father   . Heart attack Father   . Breast cancer Maternal Aunt     Past medical history, surgical history, medications, allergies, family history and social history reviewed with patient today and changes made to appropriate areas of the chart.   Review of Systems - General ROS: negative Psychological ROS: negative Ophthalmic ROS: negative ENT ROS: negative Allergy and Immunology ROS:  negative Hematological and Lymphatic ROS: negative Endocrine ROS: negative Breast ROS: negative for breast lumps Respiratory ROS: no cough, shortness of breath, or wheezing Cardiovascular ROS: no chest pain or dyspnea on exertion Gastrointestinal ROS: no abdominal pain, change in bowel habits, or black or bloody stools Genito-Urinary ROS: no dysuria, trouble voiding, or hematuria Musculoskeletal ROS: negative Neurological ROS: no TIA or stroke symptoms Dermatological ROS:  negative All other ROS negative except what is listed above and in the HPI.      Objective:    BP 118/73   Pulse 66   Temp 98 F (36.7 C) (Oral)   Ht 5' 0.7" (1.542 m)   Wt 183 lb (83 kg)   LMP  (LMP Unknown)   SpO2 98%   BMI 34.92 kg/m   Wt Readings from Last 3 Encounters:  11/23/19 183 lb (83 kg)  07/27/19 184 lb (83.5 kg)  05/20/19 196 lb (88.9 kg)    Physical Exam Vitals and nursing note reviewed.  Constitutional:      General: She is not in acute distress.    Appearance: She is well-developed.  HENT:     Head: Atraumatic.     Right Ear: External ear normal.     Left Ear: External ear normal.     Nose: Nose normal.     Mouth/Throat:     Pharynx: No oropharyngeal exudate.  Eyes:     General: No scleral icterus.    Conjunctiva/sclera: Conjunctivae normal.     Pupils: Pupils are equal, round, and reactive to light.  Neck:     Thyroid: No thyromegaly.  Cardiovascular:     Rate and Rhythm: Normal rate and regular rhythm.     Heart sounds: Normal heart sounds.  Pulmonary:     Effort: Pulmonary effort is normal. No respiratory distress.     Breath sounds: Normal breath sounds.  Chest:     Breasts:        Right: No mass, skin change or tenderness.        Left: No mass, skin change or tenderness.  Abdominal:     General: Bowel sounds are normal.     Palpations: Abdomen is soft. There is no mass.     Tenderness: There is no abdominal tenderness.  Genitourinary:    Comments: GU exam  declined Musculoskeletal:        General: No tenderness. Normal range of motion.     Cervical back: Normal range of motion and neck supple.  Lymphadenopathy:     Cervical: No cervical adenopathy.     Upper Body:     Right upper body: No axillary adenopathy.     Left upper body: No axillary adenopathy.  Skin:    General: Skin is warm and dry.     Findings: No rash.  Neurological:     Mental Status: She is alert and oriented to person, place, and time.     Cranial Nerves: No cranial nerve deficit.  Psychiatric:        Behavior: Behavior normal.     Results for orders placed or performed in visit on 07/27/19  Cologuard  Result Value Ref Range   Cologuard Negative Negative      Assessment & Plan:   Problem List Items Addressed This Visit      Respiratory   Allergic rhinitis    Stable and well controlled, continue current regimen      Sleep apnea    Stable on CPAP nightly, continue daily use        Endocrine   IFG (impaired fasting glucose) - Primary    Diet controlled, recheck A1C and continue excellent lifestyle changes      Relevant Orders   UA/M w/rflx Culture, Routine   HgB A1c     Other   Hyperlipidemia    Recheck lipids, adjust as needed. Continue current regimen  Relevant Orders   Comprehensive metabolic panel   Lipid Panel w/o Chol/HDL Ratio   History of CVA (cerebrovascular accident)    No new concerns, continue current regimen and monitoring      Relevant Orders   CBC with Differential/Platelet    Other Visit Diagnoses    Annual physical exam           Follow up plan: Return in about 6 months (around 05/25/2020) for 6 month f/u.   LABORATORY TESTING:  - Pap smear: not applicable  IMMUNIZATIONS:   - Tdap: Tetanus vaccination status reviewed: postponed. - Influenza: Refused - Pneumovax: Up to date - Prevnar: Up to date - HPV: Not applicable - Zostavax vaccine: Up to date  SCREENING: -Mammogram: Up to date  - Colonoscopy: Up  to date  - Bone Density: Up to date   PATIENT COUNSELING:   Advised to take 1 mg of folate supplement per day if capable of pregnancy.   Sexuality: Discussed sexually transmitted diseases, partner selection, use of condoms, avoidance of unintended pregnancy  and contraceptive alternatives.   Advised to avoid cigarette smoking.  I discussed with the patient that most people either abstain from alcohol or drink within safe limits (<=14/week and <=4 drinks/occasion for males, <=7/weeks and <= 3 drinks/occasion for females) and that the risk for alcohol disorders and other health effects rises proportionally with the number of drinks per week and how often a drinker exceeds daily limits.  Discussed cessation/primary prevention of drug use and availability of treatment for abuse.   Diet: Encouraged to adjust caloric intake to maintain  or achieve ideal body weight, to reduce intake of dietary saturated fat and total fat, to limit sodium intake by avoiding high sodium foods and not adding table salt, and to maintain adequate dietary potassium and calcium preferably from fresh fruits, vegetables, and low-fat dairy products.    stressed the importance of regular exercise  Injury prevention: Discussed safety belts, safety helmets, smoke detector, smoking near bedding or upholstery.   Dental health: Discussed importance of regular tooth brushing, flossing, and dental visits.    NEXT PREVENTATIVE PHYSICAL DUE IN 1 YEAR. Return in about 6 months (around 05/25/2020) for 6 month f/u.

## 2019-11-23 NOTE — Assessment & Plan Note (Signed)
Recheck lipids, adjust as needed. Continue current regimen 

## 2019-11-23 NOTE — Assessment & Plan Note (Signed)
Stable on CPAP nightly, continue daily use

## 2019-11-23 NOTE — Assessment & Plan Note (Signed)
Stable and well controlled, continue current regimen 

## 2019-11-23 NOTE — Telephone Encounter (Signed)
Patient scheduled for 05/16/2020 at 9am with PCP

## 2019-11-24 LAB — COMPREHENSIVE METABOLIC PANEL
ALT: 19 IU/L (ref 0–32)
AST: 19 IU/L (ref 0–40)
Albumin/Globulin Ratio: 2 (ref 1.2–2.2)
Albumin: 4.5 g/dL (ref 3.8–4.8)
Alkaline Phosphatase: 80 IU/L (ref 39–117)
BUN/Creatinine Ratio: 15 (ref 12–28)
BUN: 14 mg/dL (ref 8–27)
Bilirubin Total: 0.3 mg/dL (ref 0.0–1.2)
CO2: 18 mmol/L — ABNORMAL LOW (ref 20–29)
Calcium: 9.7 mg/dL (ref 8.7–10.3)
Chloride: 108 mmol/L — ABNORMAL HIGH (ref 96–106)
Creatinine, Ser: 0.94 mg/dL (ref 0.57–1.00)
GFR calc Af Amer: 71 mL/min/{1.73_m2} (ref >59)
GFR calc non Af Amer: 62 mL/min/{1.73_m2} (ref >59)
Globulin, Total: 2.2 g/dL (ref 1.5–4.5)
Glucose: 101 mg/dL — ABNORMAL HIGH (ref 65–99)
Potassium: 4.2 mmol/L (ref 3.5–5.2)
Sodium: 143 mmol/L (ref 134–144)
Total Protein: 6.7 g/dL (ref 6.0–8.5)

## 2019-11-24 LAB — CBC WITH DIFFERENTIAL/PLATELET
Basophils Absolute: 0.1 10*3/uL (ref 0.0–0.2)
Basos: 1 %
EOS (ABSOLUTE): 0.5 10*3/uL — ABNORMAL HIGH (ref 0.0–0.4)
Eos: 8 %
Hematocrit: 42.7 % (ref 34.0–46.6)
Hemoglobin: 14.2 g/dL (ref 11.1–15.9)
Immature Grans (Abs): 0 10*3/uL (ref 0.0–0.1)
Immature Granulocytes: 0 %
Lymphocytes Absolute: 1.9 10*3/uL (ref 0.7–3.1)
Lymphs: 26 %
MCH: 29.5 pg (ref 26.6–33.0)
MCHC: 33.3 g/dL (ref 31.5–35.7)
MCV: 89 fL (ref 79–97)
Monocytes Absolute: 0.6 10*3/uL (ref 0.1–0.9)
Monocytes: 9 %
Neutrophils Absolute: 4 10*3/uL (ref 1.4–7.0)
Neutrophils: 56 %
Platelets: 277 10*3/uL (ref 150–450)
RBC: 4.82 x10E6/uL (ref 3.77–5.28)
RDW: 12.7 % (ref 11.7–15.4)
WBC: 7.2 10*3/uL (ref 3.4–10.8)

## 2019-11-24 LAB — HEMOGLOBIN A1C
Est. average glucose Bld gHb Est-mCnc: 111 mg/dL
Hgb A1c MFr Bld: 5.5 % (ref 4.8–5.6)

## 2019-11-24 LAB — LIPID PANEL W/O CHOL/HDL RATIO
Cholesterol, Total: 178 mg/dL (ref 100–199)
HDL: 59 mg/dL (ref >39)
LDL Chol Calc (NIH): 99 mg/dL (ref 0–99)
Triglycerides: 112 mg/dL (ref 0–149)
VLDL Cholesterol Cal: 20 mg/dL (ref 5–40)

## 2019-12-14 ENCOUNTER — Other Ambulatory Visit: Payer: Self-pay | Admitting: Family Medicine

## 2020-03-18 ENCOUNTER — Other Ambulatory Visit: Payer: Self-pay | Admitting: Family Medicine

## 2020-04-16 ENCOUNTER — Other Ambulatory Visit: Payer: Self-pay | Admitting: Family Medicine

## 2020-05-16 ENCOUNTER — Other Ambulatory Visit: Payer: Self-pay

## 2020-05-16 ENCOUNTER — Ambulatory Visit (INDEPENDENT_AMBULATORY_CARE_PROVIDER_SITE_OTHER): Payer: Medicare Other | Admitting: Family Medicine

## 2020-05-16 ENCOUNTER — Encounter: Payer: Self-pay | Admitting: Family Medicine

## 2020-05-16 VITALS — BP 138/86 | HR 64 | Temp 98.0°F | Wt 185.0 lb

## 2020-05-16 DIAGNOSIS — E7849 Other hyperlipidemia: Secondary | ICD-10-CM | POA: Diagnosis not present

## 2020-05-16 DIAGNOSIS — R7301 Impaired fasting glucose: Secondary | ICD-10-CM

## 2020-05-16 DIAGNOSIS — E669 Obesity, unspecified: Secondary | ICD-10-CM | POA: Diagnosis not present

## 2020-05-16 DIAGNOSIS — J309 Allergic rhinitis, unspecified: Secondary | ICD-10-CM

## 2020-05-16 DIAGNOSIS — Z8673 Personal history of transient ischemic attack (TIA), and cerebral infarction without residual deficits: Secondary | ICD-10-CM

## 2020-05-16 NOTE — Progress Notes (Signed)
BP 138/86   Pulse 64   Temp 98 F (36.7 C) (Oral)   Wt 185 lb (83.9 kg)   LMP  (LMP Unknown)   SpO2 99%   BMI 35.30 kg/m    Subjective:    Patient ID: Bethany Lara, female    DOB: [birth year], 69 y.o.   MRN: [id number]  HPI: Bethany Lara is a 69 y.o. female  Chief Complaint  Patient presents with  . IFG  . Hyperlipidemia   Here today for 6 month f/u chronic conditions.   IFG - diet controlled. Does not closely follow home BSs. Denies polyuria, polydipsia, polyphagia.   HLD, Hx of CVA - on pravastatin, fenofibrate and plavix. No new neurologic episodes, denies side effects, CP, SOB, claudication, myalgias.   Allergic rhinitis - stable on zyrtec and flonase regimen, tolerating well without breakthrough sxs  Relevant past medical, surgical, family and social history reviewed and updated as indicated. Interim medical history since our last visit reviewed. Allergies and medications reviewed and updated.  Review of Systems  Per HPI unless specifically indicated above     Objective:    BP 138/86   Pulse 64   Temp 98 F (36.7 C) (Oral)   Wt 185 lb (83.9 kg)   LMP  (LMP Unknown)   SpO2 99%   BMI 35.30 kg/m   Wt Readings from Last 3 Encounters:  05/16/20 185 lb (83.9 kg)  11/23/19 183 lb (83 kg)  07/27/19 184 lb (83.5 kg)    Physical Exam Vitals and nursing note reviewed.  Constitutional:      Appearance: Normal appearance. She is not ill-appearing.  HENT:     Head: Atraumatic.  Eyes:     Extraocular Movements: Extraocular movements intact.     Conjunctiva/sclera: Conjunctivae normal.  Cardiovascular:     Rate and Rhythm: Normal rate and regular rhythm.     Heart sounds: Normal heart sounds.  Pulmonary:     Effort: Pulmonary effort is normal.     Breath sounds: Normal breath sounds.  Musculoskeletal:        General: Normal range of motion.     Cervical back: Normal range of motion and neck supple.  Skin:    General: Skin is warm and dry.    Neurological:     Mental Status: She is alert and oriented to person, place, and time.  Psychiatric:        Mood and Affect: Mood normal.        Thought Content: Thought content normal.        Judgment: Judgment normal.     Results for orders placed or performed in visit on 05/16/20  Comprehensive metabolic panel  Result Value Ref Range   Glucose 111 (H) 65 - 99 mg/dL   BUN 17 8 - 27 mg/dL   Creatinine, Ser 0.97 0.57 - 1.00 mg/dL   GFR calc non Af Amer 59 (L) >59 mL/min/1.73   GFR calc Af Amer 68 >59 mL/min/1.73   BUN/Creatinine Ratio 18 12 - 28   Sodium 142 134 - 144 mmol/L   Potassium 4.1 3.5 - 5.2 mmol/L   Chloride 106 96 - 106 mmol/L   CO2 21 20 - 29 mmol/L   Calcium 9.4 8.7 - 10.3 mg/dL   Total Protein 6.5 6.0 - 8.5 g/dL   Albumin 4.3 3.7 - 4.7 g/dL   Globulin, Total 2.2 1.5 - 4.5 g/dL   Albumin/Globulin Ratio 2.0 1.2 - 2.2   Bilirubin Total  0.3 0.0 - 1.2 mg/dL   Alkaline Phosphatase 75 48 - 121 IU/L   AST 16 0 - 40 IU/L   ALT 17 0 - 32 IU/L  CBC with Differential/Platelet  Result Value Ref Range   WBC 8.2 3.4 - 10.8 x10E3/uL   RBC 4.68 3.77 - 5.28 x10E6/uL   Hemoglobin 13.8 11.1 - 15.9 g/dL   Hematocrit 41.8 34.0 - 46.6 %   MCV 89 79 - 97 fL   MCH 29.5 26.6 - 33.0 pg   MCHC 33.0 31 - 35 g/dL   RDW 12.6 11.7 - 15.4 %   Platelets 274 150 - 450 x10E3/uL   Neutrophils 56 Not Estab. %   Lymphs 27 Not Estab. %   Monocytes 9 Not Estab. %   Eos 6 Not Estab. %   Basos 1 Not Estab. %   Neutrophils Absolute 4.7 1 - 7 x10E3/uL   Lymphocytes Absolute 2.2 0 - 3 x10E3/uL   Monocytes Absolute 0.7 0 - 0 x10E3/uL   EOS (ABSOLUTE) 0.5 (H) 0.0 - 0.4 x10E3/uL   Basophils Absolute 0.1 0 - 0 x10E3/uL   Immature Granulocytes 1 Not Estab. %   Immature Grans (Abs) 0.0 0.0 - 0.1 x10E3/uL  Lipid Panel w/o Chol/HDL Ratio  Result Value Ref Range   Cholesterol, Total 190 100 - 199 mg/dL   Triglycerides 102 0 - 149 mg/dL   HDL 57 >39 mg/dL   VLDL Cholesterol Cal 18 5 - 40 mg/dL    LDL Chol Calc (NIH) 115 (H) 0 - 99 mg/dL  HgB A1c  Result Value Ref Range   Hgb A1c MFr Bld 5.9 (H) 4.8 - 5.6 %   Est. average glucose Bld gHb Est-mCnc 123 mg/dL      Assessment & Plan:   Problem List Items Addressed This Visit      Respiratory   Allergic rhinitis    Stable and under good control, continue present regimen        Endocrine   IFG (impaired fasting glucose) - Primary    Diet controlled. Recheck A1C, continue lifestyle modifications      Relevant Orders   HgB A1c (Completed)     Other   Hyperlipidemia    Recheck lipids, adjust as needed. Continue present medications and diet, exercise      Relevant Orders   Comprehensive metabolic panel (Completed)   Lipid Panel w/o Chol/HDL Ratio (Completed)   History of CVA (cerebrovascular accident)    No new neurologic issues. Continue present medications      Relevant Orders   CBC with Differential/Platelet (Completed)   Obesity (BMI 30-39.9)    Diet and exercise reviewed          Follow up plan: Return in about 6 months (around 11/16/2020) for CPE.

## 2020-05-17 LAB — HEMOGLOBIN A1C
Est. average glucose Bld gHb Est-mCnc: 123 mg/dL
Hgb A1c MFr Bld: 5.9 % — ABNORMAL HIGH (ref 4.8–5.6)

## 2020-05-17 LAB — COMPREHENSIVE METABOLIC PANEL
ALT: 17 IU/L (ref 0–32)
AST: 16 IU/L (ref 0–40)
Albumin/Globulin Ratio: 2 (ref 1.2–2.2)
Albumin: 4.3 g/dL (ref 3.7–4.7)
Alkaline Phosphatase: 75 IU/L (ref 48–121)
BUN/Creatinine Ratio: 18 (ref 12–28)
BUN: 17 mg/dL (ref 8–27)
Bilirubin Total: 0.3 mg/dL (ref 0.0–1.2)
CO2: 21 mmol/L (ref 20–29)
Calcium: 9.4 mg/dL (ref 8.7–10.3)
Chloride: 106 mmol/L (ref 96–106)
Creatinine, Ser: 0.97 mg/dL (ref 0.57–1.00)
GFR calc Af Amer: 68 mL/min/{1.73_m2} (ref >59)
GFR calc non Af Amer: 59 mL/min/{1.73_m2} — ABNORMAL LOW (ref >59)
Globulin, Total: 2.2 g/dL (ref 1.5–4.5)
Glucose: 111 mg/dL — ABNORMAL HIGH (ref 65–99)
Potassium: 4.1 mmol/L (ref 3.5–5.2)
Sodium: 142 mmol/L (ref 134–144)
Total Protein: 6.5 g/dL (ref 6.0–8.5)

## 2020-05-17 LAB — CBC WITH DIFFERENTIAL/PLATELET
Basophils Absolute: 0.1 10*3/uL (ref 0.0–0.2)
Basos: 1 %
EOS (ABSOLUTE): 0.5 10*3/uL — ABNORMAL HIGH (ref 0.0–0.4)
Eos: 6 %
Hematocrit: 41.8 % (ref 34.0–46.6)
Hemoglobin: 13.8 g/dL (ref 11.1–15.9)
Immature Grans (Abs): 0 10*3/uL (ref 0.0–0.1)
Immature Granulocytes: 1 %
Lymphocytes Absolute: 2.2 10*3/uL (ref 0.7–3.1)
Lymphs: 27 %
MCH: 29.5 pg (ref 26.6–33.0)
MCHC: 33 g/dL (ref 31.5–35.7)
MCV: 89 fL (ref 79–97)
Monocytes Absolute: 0.7 10*3/uL (ref 0.1–0.9)
Monocytes: 9 %
Neutrophils Absolute: 4.7 10*3/uL (ref 1.4–7.0)
Neutrophils: 56 %
Platelets: 274 10*3/uL (ref 150–450)
RBC: 4.68 x10E6/uL (ref 3.77–5.28)
RDW: 12.6 % (ref 11.7–15.4)
WBC: 8.2 10*3/uL (ref 3.4–10.8)

## 2020-05-17 LAB — LIPID PANEL W/O CHOL/HDL RATIO
Cholesterol, Total: 190 mg/dL (ref 100–199)
HDL: 57 mg/dL (ref >39)
LDL Chol Calc (NIH): 115 mg/dL — ABNORMAL HIGH (ref 0–99)
Triglycerides: 102 mg/dL (ref 0–149)
VLDL Cholesterol Cal: 18 mg/dL (ref 5–40)

## 2020-05-19 ENCOUNTER — Encounter: Payer: Self-pay | Admitting: Family Medicine

## 2020-05-19 DIAGNOSIS — T466X5A Adverse effect of antihyperlipidemic and antiarteriosclerotic drugs, initial encounter: Secondary | ICD-10-CM | POA: Insufficient documentation

## 2020-05-19 NOTE — Assessment & Plan Note (Signed)
Diet controlled. Recheck A1C, continue lifestyle modifications

## 2020-05-19 NOTE — Assessment & Plan Note (Signed)
Stable and under good control, continue present regimen

## 2020-05-19 NOTE — Assessment & Plan Note (Signed)
Recheck lipids, adjust as needed. Continue present medications and diet, exercise

## 2020-05-19 NOTE — Assessment & Plan Note (Signed)
No new neurologic issues. Continue present medications

## 2020-05-19 NOTE — Assessment & Plan Note (Signed)
Diet and exercise reviewed

## 2020-06-19 ENCOUNTER — Other Ambulatory Visit: Payer: Self-pay | Admitting: Family Medicine

## 2020-06-19 NOTE — Telephone Encounter (Signed)
Requested Prescriptions  Pending Prescriptions Disp Refills  . fenofibrate 160 MG tablet [Pharmacy Med Name: Fenofibrate 160 MG Oral Tablet] 90 tablet 3    Sig: TAKE 1 TABLET BY MOUTH  DAILY     Cardiovascular:  Antilipid - Fibric Acid Derivatives Failed - 06/19/2020  5:16 AM      Failed - LDL in normal range and within 360 days    LDL Chol Calc (NIH)  Date Value Ref Range Status  05/16/2020 115 (H) 0 - 99 mg/dL Final         Passed - Total Cholesterol in normal range and within 360 days    Cholesterol, Total  Date Value Ref Range Status  05/16/2020 190 100 - 199 mg/dL Final   Cholesterol Piccolo, Waived  Date Value Ref Range Status  01/21/2017 184 <200 mg/dL Final    Comment:                            Desirable                <200                         Borderline High      200- 239                         High                     >239          Passed - HDL in normal range and within 360 days    HDL  Date Value Ref Range Status  05/16/2020 57 >39 mg/dL Final         Passed - Triglycerides in normal range and within 360 days    Triglycerides  Date Value Ref Range Status  05/16/2020 102 0 - 149 mg/dL Final   Triglycerides Piccolo,Waived  Date Value Ref Range Status  01/21/2017 144 <150 mg/dL Final    Comment:                            Normal                   <150                         Borderline High     150 - 199                         High                200 - 499                         Very High                >499          Passed - ALT in normal range and within 180 days    ALT  Date Value Ref Range Status  05/16/2020 17 0 - 32 IU/L Final         Passed - AST in normal range and within 180 days    AST  Date Value Ref Range Status  05/16/2020 16 0 - 40 IU/L Final  Passed - Cr in normal range and within 180 days    Creatinine, Ser  Date Value Ref Range Status  05/16/2020 0.97 0.57 - 1.00 mg/dL Final         Passed - eGFR in normal  range and within 180 days    GFR calc Af Amer  Date Value Ref Range Status  05/16/2020 68 >59 mL/min/1.73 Final    Comment:    **[facility] currently reports eGFR in compliance with the current**   recommendations of the [employer]. [facility] will   update reporting as new guidelines are published from the NKF-ASN   Task force.    GFR calc non Af Amer  Date Value Ref Range Status  05/16/2020 59 (L) >59 mL/min/1.73 Final         Passed - Valid encounter within last 12 months    Recent Outpatient Visits          1 month ago IFG (impaired fasting glucose)   [place], [place], [place]   6 months ago IFG (impaired fasting glucose)   [facility] Volney American, [place]   1 year ago Other hyperlipidemia   [facility] Merrie Roof [place], [place]   1 year ago Cough   [place], Henrine Screws T, NP   1 year ago Allergic rhinitis, unspecified seasonality, unspecified trigger   [facility] Volney American, PA-C      Future Appointments            In 1 month  [place], [place]   In 5 months [place], Barbaraann Faster, NP [employer], [facility]

## 2020-07-18 ENCOUNTER — Telehealth: Payer: Self-pay | Admitting: Family Medicine

## 2020-07-18 NOTE — Telephone Encounter (Signed)
Copied from [place] [id number]. Topic: Medicare AWV >> Jul 18, 2020 12:49 PM Weston Anna wrote: Reason for CRM:   left message changing appointment from Nov 4th to Nov 5th at 1:00 pm and it will be a phone call not in office visit -srs  The change is due to [facility] not being in office on Nov 4,2021

## 2020-07-28 ENCOUNTER — Ambulatory Visit: Payer: Medicare Other

## 2020-07-29 ENCOUNTER — Ambulatory Visit: Payer: Medicare Other

## 2020-08-08 ENCOUNTER — Ambulatory Visit (INDEPENDENT_AMBULATORY_CARE_PROVIDER_SITE_OTHER): Payer: Medicare Other

## 2020-08-08 VITALS — Ht 61.0 in | Wt 178.0 lb

## 2020-08-08 DIAGNOSIS — Z0000 Encounter for general adult medical examination without abnormal findings: Secondary | ICD-10-CM

## 2020-08-08 NOTE — Progress Notes (Signed)
I connected with Bethany Bethany Lara today by telephone and verified that I am speaking with the correct person using two identifiers. Location patient: home Location provider: work Persons participating in the virtual visit: Bethany Lara, Bethany Bethany Lara.   I discussed the limitations, risks, security and privacy concerns of performing an evaluation and management service by telephone and the availability of in person appointments. I also discussed with the patient that there may be a patient responsible charge related to this service. The patient expressed understanding and verbally consented to this telephonic visit.    Interactive audio and video telecommunications were attempted between this provider and patient, however failed, due to patient having technical difficulties OR patient did not have access to video capability.  We continued and completed visit with audio only.     Vital signs may be patient reported or missing.  Subjective:   Bethany Bethany Lara is a 69 y.o. female who presents for Medicare Annual (Subsequent) preventive examination.  Review of Systems     Cardiac Risk Factors include: advanced age (>51men, >53 women);dyslipidemia;obesity (BMI >30kg/m2);sedentary lifestyle     Objective:    Today's Vitals   08/08/20 0856  Weight: 178 lb (80.7 kg)  Height: 5\' 1"  (1.549 m)   Body mass index is 33.63 kg/m.  Advanced Directives 08/08/2020 07/21/2018 10/30/2017 10/26/2017 10/26/2017 07/18/2017 07/17/2017  Does Patient Have a Medical Advance Directive? Yes Yes - Yes Yes Yes Yes  Type of Advance Directive [place];Living will [place];Living will [place];Living will [place];Living will - [place];Living will [place];Living will  Does patient want to make changes to medical advance directive? - - - No - Patient declined - No - Patient declined -  Copy  of [place] in Chart? Yes - validated most recent copy scanned in chart (See row information) No - copy requested No - copy requested No - copy requested - No - copy requested No - copy requested    Current Medications (verified) Outpatient Encounter Medications as of 08/08/2020  Medication Sig  . cetirizine (ZYRTEC) 10 MG tablet Take 1 tablet (10 mg total) by mouth daily.  . clopidogrel (PLAVIX) 75 MG tablet TAKE 1 TABLET BY MOUTH  DAILY  . CO ENZYME Q-10 PO Take by mouth daily.  . diclofenac sodium (VOLTAREN) 1 % GEL Apply 2 g topically 4 (four) times daily.  . fenofibrate 160 MG tablet TAKE 1 TABLET BY MOUTH  DAILY  . fluticasone (FLONASE) 50 MCG/ACT nasal spray Place 2 sprays into both nostrils 2 (two) times daily.  Marland Kitchen MAGNESIUM PO Take by mouth daily.  . Multiple Vitamin (MULTIVITAMIN) tablet Place 1 tablet into feeding tube daily. (Patient taking differently: Take 1 tablet by mouth daily. )  . NON FORMULARY CPAP @@ bedtime.  . pravastatin (PRAVACHOL) 40 MG tablet TAKE 1 TABLET BY MOUTH  DAILY  . Vitamin D, Cholecalciferol, 1000 units TABS Give 2,000 Units by tube daily. (Patient taking differently: Take 2,000 Units by mouth daily. )   No facility-administered encounter medications on file as of 08/08/2020.    Allergies (verified) Crestor [rosuvastatin calcium] and Zocor [simvastatin]   History: Past Medical History:  Diagnosis Date  . Allergy   . Arrhythmia   . Carpal tunnel syndrome   . Hyperlipidemia    Past Surgical History:  Procedure Laterality Date  . BUNIONECTOMY    . CARPAL TUNNEL RELEASE  11/14/2015  . CARPAL  TUNNEL RELEASE Right 06/17/2019  . CHOLECYSTECTOMY    . EYELID LACERATION REPAIR     Eye Lid Surgery  . PEG PLACEMENT N/A 10/30/2017   Procedure: PERCUTANEOUS ENDOSCOPIC GASTROSTOMY (PEG) PLACEMENT;  Surgeon: Lin Landsman, MD;  Location: [facility] ENDOSCOPY;  Service: Gastroenterology;  Laterality: N/A;   Family History  Problem  Relation Age of Onset  . Diabetes Mother   . Hypertension Mother   . Dementia Mother   . Heart murmur Mother   . Diabetes Father   . Heart disease Father   . Alzheimer's disease Father   . Glaucoma Father   . Colon cancer Father   . Heart attack Father   . Breast cancer Maternal Aunt    Social History   Socioeconomic History  . Marital status: Married    Spouse name: Not on file  . Number of children: Not on file  . Years of education: 12th grade  . Highest education level: Not on file  Occupational History  . Occupation: retired  Tobacco Use  . Smoking status: Never Smoker  . Smokeless tobacco: Never Used  Vaping Use  . Vaping Use: Never used  Substance and Sexual Activity  . Alcohol use: No    Alcohol/week: 0.0 standard drinks  . Drug use: No  . Sexual activity: Yes  Other Topics Concern  . Not on file  Social History Narrative  . Not on file   Social Determinants of Health   Financial Resource Strain: Low Risk   . Difficulty of Paying Living Expenses: Not hard at all  Food Insecurity: No Food Insecurity  . Worried About Charity fundraiser in the Last Year: Never true  . Ran Out of Food in the Last Year: Never true  Transportation Needs: No Transportation Needs  . Lack of Transportation (Medical): No  . Lack of Transportation (Non-Medical): No  Physical Activity: Inactive  . Days of Exercise per Week: 0 days  . Minutes of Exercise per Session: 0 min  Stress: No Stress Concern Present  . Feeling of Stress : Not at all  Social Connections:   . Frequency of Communication with Friends and Family: Not on file  . Frequency of Social Gatherings with Friends and Family: Not on file  . Attends Religious Services: Not on file  . Active Member of Clubs or Organizations: Not on file  . Attends Archivist Meetings: Not on file  . Marital Status: Not on file    Tobacco Counseling Counseling given: Not Answered   Clinical Intake:  Pre-visit  preparation completed: Yes  Pain : No/denies pain     Nutritional Status: BMI > 30  Obese Nutritional Risks: None Diabetes: No  How often do you need to have someone help you when you read instructions, pamphlets, or other written materials from your doctor or pharmacy?: 1 - Never What is the last grade level you completed in school?: 12th grade  Diabetic? no  Interpreter Needed?: No  Information entered by :: NAllen Lara   Activities of Daily Living In your present state of health, do you have any difficulty performing the following activities: 08/08/2020 11/23/2019  Hearing? N N  Vision? N N  Difficulty concentrating or making decisions? N N  Walking or climbing stairs? N N  Dressing or bathing? N N  Doing errands, shopping? N N  Preparing Food and eating ? N -  Using the Toilet? N -  In the past six months, have you accidently leaked urine?  N -  Do you have problems with loss of bowel control? N -  Managing your Medications? N -  Managing your Finances? N -  Housekeeping or managing your Housekeeping? N -  Some recent data might be hidden    Patient Care Team: Volney American, PA-C as PCP - General (Family Medicine) Kathrine Haddock, NP as PCP - Family Medicine (Nurse Practitioner) Greg Cutter, LCSW as [place] Management (Licensed Clinical Social Worker)  Indicate any recent Bethany you may have received from other than [facility] providers in the past year (date may be approximate).     Assessment:   This is a routine wellness examination for Mathews.  Hearing/Vision screen  Hearing Screening   125Hz  250Hz  500Hz  1000Hz  2000Hz  3000Hz  4000Hz  6000Hz  8000Hz   Right ear:           Left ear:           Vision Screening Comments: Regular eye exams, [facility], Dr. Edison Pace  Dietary issues and exercise activities discussed: Current Exercise Habits: The patient does not participate in regular exercise at present  Goals    .  Increase water intake     Recommend drinking at least 7-8 glasses of water a day     . Patient Stated     08/08/2020, wants to lose weight      Depression Screen PHQ 2/9 Scores 08/08/2020 11/23/2019 07/27/2019 11/19/2018 07/21/2018 05/14/2018 07/17/2017  PHQ - 2 Score 0 0 0 0 0 0 0  PHQ- 9 Score - 1 - 2 - - 1    Fall Risk Fall Risk  08/08/2020 11/23/2019 07/27/2019 04/06/2019 07/21/2018  Falls in the past year? 0 0 0 0 Yes  Number falls in past yr: - 0 0 0 1  Injury with Fall? - 0 0 0 Yes  Risk for fall due to : Medication side effect - - - -  Follow up Falls evaluation completed;Education provided;Falls prevention discussed - - - Falls evaluation completed;Falls prevention discussed    Any stairs in or around the home? Yes  If so, are there any without handrails? No  Home free of loose throw rugs in walkways, pet beds, electrical cords, etc? Yes  Adequate lighting in your home to reduce risk of falls? Yes   ASSISTIVE DEVICES UTILIZED TO PREVENT FALLS:  Life alert? No  Use of a cane, walker or w/c? No  Grab bars in the bathroom? Yes  Shower chair or bench in shower? Yes  Elevated toilet seat or a handicapped toilet? Yes   TIMED UP AND GO:  Was the test performed? No   Cognitive Function:     6CIT Screen 08/08/2020 07/21/2018 07/17/2017  What Year? 0 points 0 points 0 points  What month? 0 points 0 points 0 points  What time? 0 points 0 points 0 points  Count back from 20 0 points 0 points 0 points  Months in reverse 0 points 0 points 0 points  Repeat phrase 0 points 2 points 2 points  Total Score 0 2 2    Immunizations Immunization History  Administered Date(s) Administered  . Influenza, High Dose Seasonal PF 07/23/2016  . Influenza,inj,Quad PF,6+ Mos 11/28/2015  . Moderna SARS-COVID-2 Vaccination 01/19/2020, 02/16/2020  . Pneumococcal Conjugate-13 07/13/2014  . Pneumococcal Polysaccharide-23 01/20/2016  . Td 11/20/2005  . Zoster 05/09/2010    TDAP status: Due,  Education has been provided regarding the importance of this vaccine. Advised may receive this vaccine at local  pharmacy or Health Dept. Aware to provide a copy of the vaccination record if obtained from local pharmacy or Health Dept. Verbalized acceptance and understanding. Flu Vaccine status: Declined, Education has been provided regarding the importance of this vaccine but patient still declined. Advised may receive this vaccine at local pharmacy or Health Dept. Aware to provide a copy of the vaccination record if obtained from local pharmacy or Health Dept. Verbalized acceptance and understanding. Pneumococcal vaccine status: Up to date Covid-19 vaccine status: Completed vaccines  Qualifies for Shingles Vaccine? Yes   Zostavax completed Yes   Shingrix Completed?: No.    Education has been provided regarding the importance of this vaccine. Patient has been advised to call insurance company to determine out of pocket expense if they have not yet received this vaccine. Advised may also receive vaccine at local pharmacy or Health Dept. Verbalized acceptance and understanding.  Screening Tests Health Maintenance  Topic Date Due  . TETANUS/TDAP  11/21/2015  . INFLUENZA VACCINE  04/24/2020  . MAMMOGRAM  10/26/2020  . Fecal DNA (Cologuard)  11/25/2021  . DEXA SCAN  Completed  . COVID-19 Vaccine  Completed  . Hepatitis C Screening  Completed  . PNA vac Low Risk Adult  Completed    Health Maintenance  Health Maintenance Due  Topic Date Due  . TETANUS/TDAP  11/21/2015  . INFLUENZA VACCINE  04/24/2020    Colorectal cancer screening: Completed cologuard 11/26/2018. Repeat every 3 years Mammogram status: Completed 10/27/2019. Repeat every year Bone Density status: Completed 08/30/2014. Results reflect: Bone density results: NORMAL. Repeat every 0 years.  Lung Cancer Screening: (Low Dose CT Chest recommended if Age 25-80 years, 30 pack-year currently smoking OR have quit w/in 15years.) does not  qualify.   Lung Cancer Screening Referral: no  Additional Screening:  Hepatitis C Screening: does qualify; Completed 07/22/2015  Vision Screening: Recommended annual ophthalmology exams for early detection of glaucoma and other disorders of the eye. Is the patient up to date with their annual eye exam?  Yes  Who is the provider or what is the name of the office in which the patient attends annual eye exams? [facility] If pt is not established with a provider, would they like to be referred to a provider to establish care? No .   Dental Screening: Recommended annual dental exams for proper oral hygiene  Community Resource Referral / Chronic Care Management: CRR required this visit?  No   CCM required this visit?  No      Plan:     I have personally reviewed and noted the following in the patient's chart:   . Medical and social history . Use of alcohol, tobacco or illicit drugs  . Current medications and supplements . Functional ability and status . Nutritional status . Physical activity . Advanced directives . List of other physicians . Hospitalizations, surgeries, and ER visits in previous 12 months . Vitals . Screenings to include cognitive, depression, and falls . Referrals and appointments  In addition, I have reviewed and discussed with patient certain preventive protocols, quality metrics, and best practice recommendations. A written personalized care plan for preventive services as well as general preventive health recommendations were provided to patient.     Kellie Simmering, Lara   [id number]   Nurse Notes:

## 2020-08-08 NOTE — Patient Instructions (Signed)
Bethany Lara , Thank you for taking time to come for your Medicare Wellness Visit. I appreciate your ongoing commitment to your health goals. Please review the following plan we discussed and let me know if I can assist you in the future.   Screening recommendations/referrals: Colonoscopy: cologuard completed 11/26/2018, due 11/25/2021 Mammogram: completed 10/27/2019, due 10/26/2020 Bone Density: completed 08/30/2014 Recommended yearly ophthalmology/optometry visit for glaucoma screening and checkup Recommended yearly dental visit for hygiene and checkup  Vaccinations: Influenza vaccine: decline Pneumococcal vaccine: completed 01/20/2016 Tdap vaccine: due Shingles vaccine: discussed   Covid-19: 02/16/2020, 01/19/2020  Advanced directives: copy in chart  Conditions/risks identified: none  Next appointment: Follow up in one year for your annual wellness visit    Preventive Care 71 Years and Older, Female Preventive care refers to lifestyle choices and visits with your health care provider that can promote health and wellness. What does preventive care include?  A yearly physical exam. This is also called an annual well check.  Dental exams once or twice a year.  Routine eye exams. Ask your health care provider how often you should have your eyes checked.  Personal lifestyle choices, including:  Daily care of your teeth and gums.  Regular physical activity.  Eating a healthy diet.  Avoiding tobacco and drug use.  Limiting alcohol use.  Practicing safe sex.  Taking low-dose aspirin every day.  Taking vitamin and mineral supplements as recommended by your health care provider. What happens during an annual well check? The services and screenings done by your health care provider during your annual well check will depend on your age, overall health, lifestyle risk factors, and family history of disease. Counseling  Your health care provider may ask you questions about  your:  Alcohol use.  Tobacco use.  Drug use.  Emotional well-being.  Home and relationship well-being.  Sexual activity.  Eating habits.  History of falls.  Memory and ability to understand (cognition).  Work and work Statistician.  Reproductive health. Screening  You may have the following tests or measurements:  Height, weight, and BMI.  Blood pressure.  Lipid and cholesterol levels. These may be checked every 5 years, or more frequently if you are over 23 years old.  Skin check.  Lung cancer screening. You may have this screening every year starting at age 74 if you have a 30-pack-year history of smoking and currently smoke or have quit within the past 15 years.  Fecal occult blood test (FOBT) of the stool. You may have this test every year starting at age 29.  Flexible sigmoidoscopy or colonoscopy. You may have a sigmoidoscopy every 5 years or a colonoscopy every 10 years starting at age 30.  Hepatitis C blood test.  Hepatitis B blood test.  Sexually transmitted disease (STD) testing.  Diabetes screening. This is done by checking your blood sugar (glucose) after you have not eaten for a while (fasting). You may have this done every 1-3 years.  Bone density scan. This is done to screen for osteoporosis. You may have this done starting at age 53.  Mammogram. This may be done every 1-2 years. Talk to your health care provider about how often you should have regular mammograms. Talk with your health care provider about your test results, treatment options, and if necessary, the need for more tests. Vaccines  Your health care provider may recommend certain vaccines, such as:  Influenza vaccine. This is recommended every year.  Tetanus, diphtheria, and acellular pertussis (Tdap, Td) vaccine. You may  need a Td booster every 10 years.  Zoster vaccine. You may need this after age 71.  Pneumococcal 13-valent conjugate (PCV13) vaccine. One dose is recommended  after age 43.  Pneumococcal polysaccharide (PPSV23) vaccine. One dose is recommended after age 57. Talk to your health care provider about which screenings and vaccines you need and how often you need them. This information is not intended to replace advice given to you by your health care provider. Make sure you discuss any questions you have with your health care provider. Document Released: 10/07/2015 Document Revised: 05/30/2016 Document Reviewed: 07/12/2015 Elsevier Interactive Patient Education  2017 [employer] Prevention in the Home Falls can cause injuries. They can happen to people of 71 ages. There are many things you can do to make your home safe and to help prevent falls. What can I do on the outside of my home?  Regularly fix the edges of walkways and driveways and fix any cracks.  Remove anything that might make you trip as you walk through a door, such as a raised step or threshold.  Trim any bushes or trees on the path to your home.  Use bright outdoor lighting.  Clear any walking paths of anything that might make someone trip, such as rocks or tools.  Regularly check to see if handrails are loose or broken. Make sure that both sides of any steps have handrails.  Any raised decks and porches should have guardrails on the edges.  Have any leaves, snow, or ice cleared regularly.  Use sand or salt on walking paths during winter.  Clean up any spills in your garage right away. This includes oil or grease spills. What can I do in the bathroom?  Use night lights.  Install grab bars by the toilet and in the tub and shower. Do not use towel bars as grab bars.  Use non-skid mats or decals in the tub or shower.  If you need to sit down in the shower, use a plastic, non-slip stool.  Keep the floor dry. Clean up any water that spills on the floor as soon as it happens.  Remove soap buildup in the tub or shower regularly.  Attach bath mats securely with  double-sided non-slip rug tape.  Do not have throw rugs and other things on the floor that can make you trip. What can I do in the bedroom?  Use night lights.  Make sure that you have a light by your bed that is easy to reach.  Do not use any sheets or blankets that are too big for your bed. They should not hang down onto the floor.  Have a firm chair that has side arms. You can use this for support while you get dressed.  Do not have throw rugs and other things on the floor that can make you trip. What can I do in the kitchen?  Clean up any spills right away.  Avoid walking on wet floors.  Keep items that you use a lot in easy-to-reach places.  If you need to reach something above you, use a strong step stool that has a grab bar.  Keep electrical cords out of the way.  Do not use floor polish or wax that makes floors slippery. If you must use wax, use non-skid floor wax.  Do not have throw rugs and other things on the floor that can make you trip. What can I do with my stairs?  Do not leave any items on  the stairs.  Make sure that there are handrails on both sides of the stairs and use them. Fix handrails that are broken or loose. Make sure that handrails are as long as the stairways.  Check any carpeting to make sure that it is firmly attached to the stairs. Fix any carpet that is loose or worn.  Avoid having throw rugs at the top or bottom of the stairs. If you do have throw rugs, attach them to the floor with carpet tape.  Make sure that you have a light switch at the top of the stairs and the bottom of the stairs. If you do not have them, ask someone to add them for you. What else can I do to help prevent falls?  Wear shoes that:  Do not have high heels.  Have rubber bottoms.  Are comfortable and fit you well.  Are closed at the toe. Do not wear sandals.  If you use a stepladder:  Make sure that it is fully opened. Do not climb a closed stepladder.  Make  sure that both sides of the stepladder are locked into place.  Ask someone to hold it for you, if possible.  Clearly mark and make sure that you can see:  Any grab bars or handrails.  First and last steps.  Where the edge of each step is.  Use tools that help you move around (mobility aids) if they are needed. These include:  Canes.  Walkers.  Scooters.  Crutches.  Turn on the lights when you go into a dark area. Replace any light bulbs as soon as they burn out.  Set up your furniture so you have a clear path. Avoid moving your furniture around.  If any of your floors are uneven, fix them.  If there are any pets around you, be aware of where they are.  Review your medicines with your doctor. Some medicines can make you feel dizzy. This can increase your chance of falling. Ask your doctor what other things that you can do to help prevent falls. This information is not intended to replace advice given to you by your health care provider. Make sure you discuss any questions you have with your health care provider. Document Released: 07/07/2009 Document Revised: 02/16/2016 Document Reviewed: 10/15/2014 Elsevier Interactive Patient Education  2017 [employer].

## 2020-11-14 ENCOUNTER — Telehealth: Payer: Self-pay

## 2020-11-14 NOTE — Telephone Encounter (Signed)
Called initially to r/s 3/3 appt. She was scheduled with Jolene at 9 3/3 moved to [place] 3/3 at 8:40

## 2020-11-21 ENCOUNTER — Telehealth: Payer: Self-pay

## 2020-11-21 NOTE — Telephone Encounter (Signed)
Copied from [place] [id number]. Topic: General - Other >> Nov 21, 2020 11:54 AM Leward Quan A wrote: Reason for CRM: Patient called in to ask to reschedule her appointment doe to some issues in her scheduling. She would like a Friday appointment since that is her less complicated day. Also call before before 2 PM or after 4 PM Ph# [phone] or  [phone]   LVM TO RESCHEDULE APT.s.Aydian Dimmick

## 2020-11-22 ENCOUNTER — Telehealth: Payer: Self-pay

## 2020-11-22 NOTE — Telephone Encounter (Signed)
Will route to Santiago Glad, this will be her patient.

## 2020-11-22 NOTE — Telephone Encounter (Signed)
Pt came to leave form for disability parking placard.Form left in bin to be reviewed.

## 2020-11-22 NOTE — Telephone Encounter (Signed)
In your folder for signature

## 2020-11-23 NOTE — Telephone Encounter (Signed)
Formed has been signed.

## 2020-11-23 NOTE — Telephone Encounter (Signed)
Placed in the completed bin, will call patient to pick up.

## 2020-11-23 NOTE — Telephone Encounter (Addendum)
Patient handicap sticker expired and would like to renew. 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

## 2020-11-23 NOTE — Telephone Encounter (Signed)
Why does she need the parking placard?

## 2020-11-23 NOTE — Telephone Encounter (Signed)
Called and left a message for patient asking why she need the handicap placard

## 2020-11-24 ENCOUNTER — Encounter: Payer: Medicare Other | Admitting: Nurse Practitioner

## 2020-12-09 ENCOUNTER — Other Ambulatory Visit: Payer: Self-pay

## 2020-12-09 ENCOUNTER — Encounter: Payer: Medicare Other | Admitting: Nurse Practitioner

## 2020-12-09 ENCOUNTER — Ambulatory Visit (INDEPENDENT_AMBULATORY_CARE_PROVIDER_SITE_OTHER): Payer: Medicare Other | Admitting: Nurse Practitioner

## 2020-12-09 ENCOUNTER — Encounter: Payer: Self-pay | Admitting: Nurse Practitioner

## 2020-12-09 VITALS — BP 113/73 | HR 62 | Temp 97.8°F | Ht 61.0 in | Wt 193.0 lb

## 2020-12-09 DIAGNOSIS — G4733 Obstructive sleep apnea (adult) (pediatric): Secondary | ICD-10-CM | POA: Diagnosis not present

## 2020-12-09 DIAGNOSIS — R7301 Impaired fasting glucose: Secondary | ICD-10-CM

## 2020-12-09 DIAGNOSIS — Z431 Encounter for attention to gastrostomy: Secondary | ICD-10-CM | POA: Insufficient documentation

## 2020-12-09 DIAGNOSIS — Z0000 Encounter for general adult medical examination without abnormal findings: Secondary | ICD-10-CM

## 2020-12-09 DIAGNOSIS — Z1231 Encounter for screening mammogram for malignant neoplasm of breast: Secondary | ICD-10-CM

## 2020-12-09 DIAGNOSIS — E8881 Metabolic syndrome: Secondary | ICD-10-CM

## 2020-12-09 DIAGNOSIS — E7849 Other hyperlipidemia: Secondary | ICD-10-CM | POA: Diagnosis not present

## 2020-12-09 MED ORDER — DICLOFENAC SODIUM 1 % EX GEL: 2.0000 g | Freq: Four times a day (QID) | CUTANEOUS | 1 refills | Status: AC

## 2020-12-09 NOTE — Assessment & Plan Note (Signed)
Chronic.  Stable.  Diet controlled. Recheck A1C, continue lifestyle modifications.

## 2020-12-09 NOTE — Assessment & Plan Note (Signed)
Stable.  Continue with current regimen.  Labs ordered today.  Will adjust medications as necessary.

## 2020-12-09 NOTE — Assessment & Plan Note (Signed)
Chronic.  Stable.  Recheck lipids, adjust as needed. Continue present medications and diet, exercise,

## 2020-12-09 NOTE — Progress Notes (Signed)
BP 113/73   Pulse 62   Temp 97.8 F (36.6 C)   Ht 5\' 1"  (1.549 m)   Wt 193 lb (87.5 kg)   LMP  (LMP Unknown)   SpO2 98%   BMI 36.47 kg/m    Subjective:    Patient ID: Bethany Lara, female    DOB: 07-Apr-1951, 70 y.o.   MRN: [id number]  HPI: Bethany Lara is a 70 y.o. female presenting on 12/09/2020 for comprehensive medical examination. Current medical complaints include:none  She currently lives with: Husband and Mother Menopausal Symptoms: no  HYPERLIPIDEMIA Hyperlipidemia status: excellent compliance Satisfied with current treatment?  yes Side effects:  no Medication compliance: excellent compliance Past cholesterol meds: pravastatin (pravachol) Supplements: fish oil Aspirin:  no The 10-year ASCVD risk score Mikey Bussing DC Jr., et al., 2013) is: 8.2%   Values used to calculate the score:     Age: 26 years     Sex: Female     Is Non-Hispanic African American: No     Diabetic: No     Tobacco smoker: No     Systolic Blood Pressure: 810 mmHg     Is BP treated: No     HDL Cholesterol: 57 mg/dL     Total Cholesterol: 190 mg/dL Chest pain:  no Coronary artery disease:  no Family history CAD:  no Family history early CAD:  no   Depression Screen done today and results listed below:  Depression screen [facility] 2/9 12/09/2020 08/08/2020 11/23/2019 07/27/2019 11/19/2018  Decreased Interest 0 0 0 0 0  Down, Depressed, Hopeless 0 0 0 0 0  PHQ - 2 Score 0 0 0 0 0  Altered sleeping - - 1 - 1  Tired, decreased energy - - 0 - 1  Change in appetite - - 0 - 0  Feeling bad or failure about yourself  - - 0 - 0  Trouble concentrating - - 0 - 0  Moving slowly or fidgety/restless - - 0 - 0  Suicidal thoughts - - 0 - 0  PHQ-9 Score - - 1 - 2  Difficult doing work/chores - - - - -    The patient does not have a history of falls. I did complete a risk assessment for falls. A plan of care for falls was documented.   Past Medical History:  Past Medical History:  Diagnosis Date  .  Allergy   . Arrhythmia   . Carpal tunnel syndrome   . Hyperlipidemia   . 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 BUNIONECTOMY    . CARPAL TUNNEL RELEASE  11/14/2015  . CARPAL TUNNEL RELEASE Right 06/17/2019  . CHOLECYSTECTOMY    . EYELID LACERATION REPAIR     Eye Lid Surgery  . PEG PLACEMENT N/A 10/30/2017   Procedure: PERCUTANEOUS ENDOSCOPIC GASTROSTOMY (PEG) PLACEMENT;  Surgeon: Lin Landsman, MD;  Location: [facility] ENDOSCOPY;  Service: Gastroenterology;  Laterality: N/A;    Medications:  Current Outpatient Medications on File Prior to Visit  Medication Sig  . cetirizine (ZYRTEC) 10 MG tablet Take 1 tablet (10 mg total) by mouth daily.  . clopidogrel (PLAVIX) 75 MG tablet TAKE 1 TABLET BY MOUTH  DAILY  . CO ENZYME Q-10 PO Take by mouth daily.  . fenofibrate 160 MG tablet TAKE 1 TABLET BY MOUTH  DAILY  . fluticasone (FLONASE) 50 MCG/ACT nasal spray Place 2 sprays into both nostrils 2 (two) times daily.  Marland Kitchen  MAGNESIUM PO Take by mouth daily.  . Multiple Vitamin (MULTIVITAMIN) tablet Place 1 tablet into feeding tube daily. (Patient taking differently: Take 1 tablet by mouth daily.)  . NON FORMULARY CPAP @@ bedtime.  . pravastatin (PRAVACHOL) 40 MG tablet TAKE 1 TABLET BY MOUTH  DAILY  . Vitamin D, Cholecalciferol, 1000 units TABS Give 2,000 Units by tube daily. (Patient taking differently: Take 2,000 Units by mouth daily.)   No current facility-administered medications on file prior to visit.    Allergies:  Allergies  Allergen Reactions  . Crestor [Rosuvastatin Calcium] Other (See Comments)    myalgias  . Zocor [Simvastatin] Other (See Comments)    Fatigue, legs achey     Social History:  Social History   Socioeconomic History  . Marital status: Married    Spouse name: Not on file  . Number of children: Not on file  . Years of education: 12th grade  . Highest education level: Not on file   Occupational History  . Occupation: retired  Tobacco Use  . Smoking status: Never Smoker  . Smokeless tobacco: Never Used  Vaping Use  . Vaping Use: Never used  Substance and Sexual Activity  . Alcohol use: No    Alcohol/week: 0.0 standard drinks  . Drug use: No  . Sexual activity: Yes  Other Topics Concern  . Not on file  Social History Narrative  . Not on file   Social Determinants of Health   Financial Resource Strain: Low Risk   . Difficulty of Paying Living Expenses: Not hard at all  Food Insecurity: No Food Insecurity  . Worried About Charity fundraiser in the Last Year: Never true  . Ran Out of Food in the Last Year: Never true  Transportation Needs: No Transportation Needs  . Lack of Transportation (Medical): No  . Lack of Transportation (Non-Medical): No  Physical Activity: Inactive  . Days of Exercise per Week: 0 days  . Minutes of Exercise per Session: 0 min  Stress: No Stress Concern Present  . Feeling of Stress : Not at all  Social Connections: Not on file  Intimate Partner Violence: Not on file   Social History   Tobacco Use  Smoking Status Never Smoker  Smokeless Tobacco Never Used   Social History   Substance and Sexual Activity  Alcohol Use No  . Alcohol/week: 0.0 standard drinks    Family History:  Family History  Problem Relation Age of Onset  . Diabetes Mother   . Hypertension Mother   . Dementia Mother   . Heart murmur Mother   . Diabetes Father   . Heart disease Father   . Alzheimer's disease Father   . Glaucoma Father   . Colon cancer Father   . Heart attack Father   . Breast cancer Maternal Aunt     Past medical history, surgical history, medications, allergies, family history and social history reviewed with patient today and changes made to appropriate areas of the chart.   Review of Systems  Eyes: Negative for blurred vision and double vision.  Respiratory: Negative for shortness of breath.   Cardiovascular: Negative  for chest pain, palpitations and leg swelling.  Neurological: Negative for dizziness and headaches.   All other ROS negative except what is listed above and in the HPI.      Objective:    BP 113/73   Pulse 62   Temp 97.8 F (36.6 C)   Ht 5\' 1"  (1.549 m)   Wt 193  lb (87.5 kg)   LMP  (LMP Unknown)   SpO2 98%   BMI 36.47 kg/m   Wt Readings from Last 3 Encounters:  12/09/20 193 lb (87.5 kg)  08/08/20 178 lb (80.7 kg)  05/16/20 185 lb (83.9 kg)    Physical Exam Vitals and nursing note reviewed.  Constitutional:      General: She is awake. She is not in acute distress.    Appearance: She is well-developed. She is not ill-appearing.  HENT:     Head: Normocephalic and atraumatic.     Right Ear: Hearing, tympanic membrane, ear canal and external ear normal. No drainage.     Left Ear: Hearing, tympanic membrane, ear canal and external ear normal. No drainage.     Nose: Nose normal.     Right Sinus: No maxillary sinus tenderness or frontal sinus tenderness.     Left Sinus: No maxillary sinus tenderness or frontal sinus tenderness.     Mouth/Throat:     Mouth: Mucous membranes are moist.     Pharynx: Oropharynx is clear. Uvula midline. No pharyngeal swelling, oropharyngeal exudate or posterior oropharyngeal erythema.  Eyes:     General: Lids are normal.        Right eye: No discharge.        Left eye: No discharge.     Extraocular Movements: Extraocular movements intact.     Conjunctiva/sclera: Conjunctivae normal.     Pupils: Pupils are equal, round, and reactive to light.     Visual Fields: Right eye visual fields normal and left eye visual fields normal.  Neck:     Thyroid: No thyromegaly.     Vascular: No carotid bruit.     Trachea: Trachea normal.  Cardiovascular:     Rate and Rhythm: Normal rate and regular rhythm.     Heart sounds: Normal heart sounds. No murmur heard. No gallop.   Pulmonary:     Effort: Pulmonary effort is normal. No accessory muscle usage or  respiratory distress.     Breath sounds: Normal breath sounds.  Chest:  Breasts:     Right: Normal. No axillary adenopathy or supraclavicular adenopathy.     Left: Normal. No axillary adenopathy or supraclavicular adenopathy.    Abdominal:     General: Bowel sounds are normal.     Palpations: Abdomen is soft. There is no hepatomegaly or splenomegaly.     Tenderness: There is no abdominal tenderness.  Musculoskeletal:        General: Normal range of motion.     Cervical back: Normal range of motion and neck supple.     Right lower leg: No edema.     Left lower leg: No edema.  Lymphadenopathy:     Head:     Right side of head: No submental, submandibular, tonsillar, preauricular or posterior auricular adenopathy.     Left side of head: No submental, submandibular, tonsillar, preauricular or posterior auricular adenopathy.     Cervical: No cervical adenopathy.     Upper Body:     Right upper body: No supraclavicular, axillary or pectoral adenopathy.     Left upper body: No supraclavicular, axillary or pectoral adenopathy.  Skin:    General: Skin is warm and dry.     Capillary Refill: Capillary refill takes less than 2 seconds.     Findings: No rash.  Neurological:     Mental Status: She is alert and oriented to person, place, and time.     Cranial Nerves: Cranial  nerves are intact.     Gait: Gait is intact.     Deep Tendon Reflexes: Reflexes are normal and symmetric.     Reflex Scores:      Brachioradialis reflexes are 2+ on the right side and 2+ on the left side.      Patellar reflexes are 2+ on the right side and 2+ on the left side. Psychiatric:        Attention and Perception: Attention normal.        Mood and Affect: Mood normal.        Speech: Speech normal.        Behavior: Behavior normal. Behavior is cooperative.        Thought Content: Thought content normal.        Judgment: Judgment normal.     Results for orders placed or performed in visit on 05/16/20   Comprehensive metabolic panel  Result Value Ref Range   Glucose 111 (H) 65 - 99 mg/dL   BUN 17 8 - 27 mg/dL   Creatinine, Ser 0.97 0.57 - 1.00 mg/dL   GFR calc non Af Amer 59 (L) >59 mL/min/1.73   GFR calc Af Amer 68 >59 mL/min/1.73   BUN/Creatinine Ratio 18 12 - 28   Sodium 142 134 - 144 mmol/L   Potassium 4.1 3.5 - 5.2 mmol/L   Chloride 106 96 - 106 mmol/L   CO2 21 20 - 29 mmol/L   Calcium 9.4 8.7 - 10.3 mg/dL   Total Protein 6.5 6.0 - 8.5 g/dL   Albumin 4.3 3.7 - 4.7 g/dL   Globulin, Total 2.2 1.5 - 4.5 g/dL   Albumin/Globulin Ratio 2.0 1.2 - 2.2   Bilirubin Total 0.3 0.0 - 1.2 mg/dL   Alkaline Phosphatase 75 48 - 121 IU/L   AST 16 0 - 40 IU/L   ALT 17 0 - 32 IU/L  CBC with Differential/Platelet  Result Value Ref Range   WBC 8.2 3.4 - 10.8 x10E3/uL   RBC 4.68 3.77 - 5.28 x10E6/uL   Hemoglobin 13.8 11.1 - 15.9 g/dL   Hematocrit 41.8 34.0 - 46.6 %   MCV 89 79 - 97 fL   MCH 29.5 26.6 - 33.0 pg   MCHC 33.0 31.5 - 35.7 g/dL   RDW 12.6 11.7 - 15.4 %   Platelets 274 150 - 450 x10E3/uL   Neutrophils 56 Not Estab. %   Lymphs 27 Not Estab. %   Monocytes 9 Not Estab. %   Eos 6 Not Estab. %   Basos 1 Not Estab. %   Neutrophils Absolute 4.7 1.4 - 7.0 x10E3/uL   Lymphocytes Absolute 2.2 0.7 - 3.1 x10E3/uL   Monocytes Absolute 0.7 0.1 - 0.9 x10E3/uL   EOS (ABSOLUTE) 0.5 (H) 0.0 - 0.4 x10E3/uL   Basophils Absolute 0.1 0.0 - 0.2 x10E3/uL   Immature Granulocytes 1 Not Estab. %   Immature Grans (Abs) 0.0 0.0 - 0.1 x10E3/uL  Lipid Panel w/o Chol/HDL Ratio  Result Value Ref Range   Cholesterol, Total 190 100 - 199 mg/dL   Triglycerides 102 0 - 149 mg/dL   HDL 57 >39 mg/dL   VLDL Cholesterol Cal 18 5 - 40 mg/dL   LDL Chol Calc (NIH) 115 (H) 0 - 99 mg/dL  HgB A1c  Result Value Ref Range   Hgb A1c MFr Bld 5.9 (H) 4.8 - 5.6 %   Est. average glucose Bld gHb Est-mCnc 123 mg/dL      Assessment & Plan:   Problem  List Items Addressed This Visit      Respiratory   Sleep apnea     Chronic.  Stable.  Uses CPAP regularly.  Continue with current regimen.        Endocrine   IFG (impaired fasting glucose)    Chronic.  Stable.  Diet controlled. Recheck A1C, continue lifestyle modifications.      Relevant Orders   Basic Metabolic Panel (BMET)   Lipid Profile   HgB A1c     Other   Hyperlipidemia    Chronic.  Stable.  Recheck lipids, adjust as needed. Continue present medications and diet, exercise,      Relevant Orders   Basic Metabolic Panel (BMET)   Lipid Profile   HgB Q2W   Metabolic syndrome    Stable.  Continue with current regimen.  Labs ordered today.  Will adjust medications as necessary.      Relevant Orders   Basic Metabolic Panel (BMET)   Lipid Profile   HgB A1c    Other Visit Diagnoses    Annual physical exam    -  Primary   Reviewed health maintenance at visit today. Needs Tetnanus and Mammogram.  Mammogram ordered today.    Encounter for screening mammogram for malignant neoplasm of breast       Relevant Orders   MM Digital Screening       Follow up plan: Return in about 6 months (around 06/11/2021) for HLD, IFG (fasting).   LABORATORY TESTING:  - Pap smear: not applicable  IMMUNIZATIONS:   - Tdap: Tetanus vaccination status reviewed: Not up to date. - Influenza: Postponed to flu season - Pneumovax: Up To Date - Prevnar: Up to date - HPV: Not applicable - Zostavax vaccine: Discussed today.  SCREENING: -Mammogram: Ordered today  - Colonoscopy: Up to date  - Bone Density: Up to date  -Hearing Test: Not applicable  -Spirometry: Not applicable   PATIENT COUNSELING:   Advised to take 1 mg of folate supplement per day if capable of pregnancy.   Sexuality: Discussed sexually transmitted diseases, partner selection, use of condoms, avoidance of unintended pregnancy  and contraceptive alternatives.   Advised to avoid cigarette smoking.  I discussed with the patient that most people either abstain from alcohol or drink within  safe limits (<=14/week and <=4 drinks/occasion for males, <=7/weeks and <= 3 drinks/occasion for females) and that the risk for alcohol disorders and other health effects rises proportionally with the number of drinks per week and how often a drinker exceeds daily limits.  Discussed cessation/primary prevention of drug use and availability of treatment for abuse.   Diet: Encouraged to adjust caloric intake to maintain  or achieve ideal body weight, to reduce intake of dietary saturated fat and total fat, to limit sodium intake by avoiding high sodium foods and not adding table salt, and to maintain adequate dietary potassium and calcium preferably from fresh fruits, vegetables, and low-fat dairy products.    stressed the importance of regular exercise  Injury prevention: Discussed safety belts, safety helmets, smoke detector, smoking near bedding or upholstery.   Dental health: Discussed importance of regular tooth brushing, flossing, and dental visits.    NEXT PREVENTATIVE PHYSICAL DUE IN 1 YEAR. Return in about 6 months (around 06/11/2021) for HLD, IFG (fasting).

## 2020-12-09 NOTE — Assessment & Plan Note (Addendum)
Chronic.  Stable.  Uses CPAP regularly.  Continue with current regimen. ?

## 2020-12-12 ENCOUNTER — Other Ambulatory Visit: Payer: Medicare Other

## 2020-12-12 ENCOUNTER — Other Ambulatory Visit: Payer: Self-pay

## 2020-12-12 DIAGNOSIS — E7849 Other hyperlipidemia: Secondary | ICD-10-CM

## 2020-12-12 DIAGNOSIS — R7301 Impaired fasting glucose: Secondary | ICD-10-CM

## 2020-12-12 DIAGNOSIS — E8881 Metabolic syndrome: Secondary | ICD-10-CM

## 2020-12-13 LAB — BASIC METABOLIC PANEL
BUN/Creatinine Ratio: 18 (ref 12–28)
BUN: 17 mg/dL (ref 8–27)
CO2: 21 mmol/L (ref 20–29)
Calcium: 9.6 mg/dL (ref 8.7–10.3)
Chloride: 103 mmol/L (ref 96–106)
Creatinine, Ser: 0.97 mg/dL (ref 0.57–1.00)
Glucose: 112 mg/dL — ABNORMAL HIGH (ref 65–99)
Potassium: 4.1 mmol/L (ref 3.5–5.2)
Sodium: 140 mmol/L (ref 134–144)
eGFR: 62 mL/min/{1.73_m2} (ref >59)

## 2020-12-13 LAB — LIPID PANEL
Chol/HDL Ratio: 4.4 ratio (ref 0.0–4.4)
Cholesterol, Total: 240 mg/dL — ABNORMAL HIGH (ref 100–199)
HDL: 54 mg/dL (ref >39)
LDL Chol Calc (NIH): 155 mg/dL — ABNORMAL HIGH (ref 0–99)
Triglycerides: 174 mg/dL — ABNORMAL HIGH (ref 0–149)
VLDL Cholesterol Cal: 31 mg/dL (ref 5–40)

## 2020-12-13 LAB — HEMOGLOBIN A1C
Est. average glucose Bld gHb Est-mCnc: 128 mg/dL
Hgb A1c MFr Bld: 6.1 % — ABNORMAL HIGH (ref 4.8–5.6)

## 2020-12-13 NOTE — Progress Notes (Signed)
Hi Bethany Lara.  It was a pleasure meeting you.  Your lab work shows that your A1c is elevated from 5.9 to 6.1.  However it is still within goal.  Continue with current regimen.  Follow a low cab diet and exercise as tolerated.    Your liver and kidney function look good.  We will continue to monitor these at future visits.   Your cholesterol is still high, but continued recommendations to make lifestyle changes. Your LDL is above normal. The LDL is the bad cholesterol. Over time and in combination with inflammation and other factors, this contributes to plaque which in turn may lead to stroke and/or heart attack down the road. Sometimes high LDL is primarily genetic, and people might be eating all the right foods but still have high numbers. Other times, there is room for improvement in one's diet and eating healthier can bring this number down and potentially reduce one's risk of heart attack and/or stroke.   To reduce your LDL, Remember - more fruits and vegetables, more fish, and limit red meat and dairy products. More soy, nuts, beans, barley, lentils, oats and plant sterol ester enriched margarine instead of butter. I also encourage eliminating sugar and processed food. Remember, shop on the outside of the grocery store and visit your [employer]. Continue with your current dose of Pravastatin.  Please let me know if you have any questions.  We should recheck your cholesterol in 3-6 months.

## 2020-12-28 ENCOUNTER — Other Ambulatory Visit: Payer: Self-pay

## 2020-12-28 ENCOUNTER — Ambulatory Visit
Admission: RE | Admit: 2020-12-28 | Discharge: 2020-12-28 | Disposition: A | Discharge status: Home / Self Care | Payer: Medicare Other | Source: Ambulatory Visit | Attending: Nurse Practitioner | Admitting: Nurse Practitioner

## 2020-12-28 DIAGNOSIS — Z1231 Encounter for screening mammogram for malignant neoplasm of breast: Secondary | ICD-10-CM | POA: Insufficient documentation

## 2021-01-01 ENCOUNTER — Other Ambulatory Visit: Payer: Self-pay | Admitting: Family Medicine

## 2021-01-01 NOTE — Telephone Encounter (Signed)
Requested Prescriptions  Pending Prescriptions Disp Refills  . clopidogrel (PLAVIX) 75 MG tablet [Pharmacy Med Name: Clopidogrel Bisulfate 75 MG Oral Tablet] 90 tablet 3    Sig: TAKE 1 TABLET BY MOUTH  DAILY     Hematology: Antiplatelets - clopidogrel Failed - 01/01/2021  4:51 AM      Failed - Evaluate AST, ALT within 2 months of therapy initiation.      Failed - HCT in normal range and within 180 days    Hematocrit  Date Value Ref Range Status  05/16/2020 41.8 34.0 - 46.6 % Final         Failed - HGB in normal range and within 180 days    Hemoglobin  Date Value Ref Range Status  05/16/2020 13.8 11.1 - 15.9 g/dL Final         Failed - PLT in normal range and within 180 days    Platelets  Date Value Ref Range Status  05/16/2020 274 150 - 450 x10E3/uL Final         Passed - ALT in normal range and within 360 days    ALT  Date Value Ref Range Status  05/16/2020 17 0 - 32 IU/L Final         Passed - AST in normal range and within 360 days    AST  Date Value Ref Range Status  05/16/2020 16 0 - 40 IU/L Final         Passed - Valid encounter within last 6 months    Recent Outpatient Visits          3 weeks ago Annual physical exam   [facility] Jon Billings, NP   7 months ago IFG (impaired fasting glucose)   [facility], [place], [place]   1 year ago IFG (impaired fasting glucose)   [facility], [place], [place]   1 year ago Other hyperlipidemia   [facility] Mer[facility] Roof [place], [place]   1 year ago Cough   [facility] Venita Lick, NP      Future Appointments            In 5 months Jon Billings, NP [facility], [place]   In 7 months  [facility], [place]

## 2021-03-14 ENCOUNTER — Other Ambulatory Visit: Payer: Self-pay | Admitting: Family Medicine

## 2021-03-16 ENCOUNTER — Other Ambulatory Visit: Payer: Self-pay | Admitting: Family Medicine

## 2021-05-10 ENCOUNTER — Other Ambulatory Visit: Payer: Self-pay | Admitting: Nurse Practitioner

## 2021-05-10 NOTE — Telephone Encounter (Signed)
Notes to clinic:  Patient has appt on 06/12/2021 Last filled on 04/08/2021 for 90  day Failed protocol: Total Cholesterol in normal range and within 360 days   LDL in normal range and within 360 days   Triglycerides in normal range and within 360 days     Requested Prescriptions  Pending Prescriptions Disp Refills   pravastatin (PRAVACHOL) 40 MG tablet [Pharmacy Med Name: Pravastatin Sodium 40 MG Oral Tablet] 90 tablet 3    Sig: TAKE 1 TABLET BY MOUTH  DAILY     Cardiovascular:  Antilipid - Statins Failed - 05/10/2021 12:46 PM      Failed - Total Cholesterol in normal range and within 360 days    Cholesterol, Total  Date Value Ref Range Status  12/12/2020 240 (H) 100 - 199 mg/dL Final   Cholesterol Piccolo, Waived  Date Value Ref Range Status  01/21/2017 184 <200 mg/dL Final    Comment:                            Desirable                <200                         Borderline High      200- 239                         High                     >239           Failed - LDL in normal range and within 360 days    LDL Chol Calc (NIH)  Date Value Ref Range Status  12/12/2020 155 (H) 0 - 99 mg/dL Final          Failed - Triglycerides in normal range and within 360 days    Triglycerides  Date Value Ref Range Status  12/12/2020 174 (H) 0 - 149 mg/dL Final   Triglycerides Piccolo,Waived  Date Value Ref Range Status  01/21/2017 144 <150 mg/dL Final    Comment:                            Normal                   <150                         Borderline High     150 - 199                         High                200 - 499                         Very High                >499           Passed - HDL in normal range and within 360 days    HDL  Date Value Ref Range Status  12/12/2020 54 >39 mg/dL Final          Passed - Patient is not pregnant  Passed - Valid encounter within last 12 months    Recent Outpatient Visits           5 months ago Annual physical  exam   [place], [place]   11 months ago IFG (impaired fasting glucose)   [facility] [place] Ro[facility] [place], [place]   1 year ago IFG (impaired fasting glucose)   [facility], [place], [place]   1 year ago Other hyperlipidemia   [facility] [place], [place]   2 years ago Cough   [facility] Venita Lick, NP       Future Appointments             In 1 month Jon Billings, NP [facility], [place]   In 3 months  [facility], [place]

## 2021-05-18 ENCOUNTER — Encounter: Payer: Self-pay | Admitting: Nurse Practitioner

## 2021-05-18 ENCOUNTER — Ambulatory Visit (INDEPENDENT_AMBULATORY_CARE_PROVIDER_SITE_OTHER): Payer: Medicare Other | Admitting: Nurse Practitioner

## 2021-05-18 ENCOUNTER — Other Ambulatory Visit: Payer: Self-pay

## 2021-05-18 VITALS — BP 111/74 | HR 58 | Temp 98.0°F | Ht 60.98 in | Wt 185.2 lb

## 2021-05-18 DIAGNOSIS — L03116 Cellulitis of left lower limb: Secondary | ICD-10-CM

## 2021-05-18 DIAGNOSIS — M5432 Sciatica, left side: Secondary | ICD-10-CM | POA: Diagnosis not present

## 2021-05-18 MED ORDER — SULFAMETHOXAZOLE-TRIMETHOPRIM 800-160 MG PO TABS: 1.0000 | ORAL_TABLET | Freq: Two times a day (BID) | ORAL | 0 refills | Status: DC

## 2021-05-18 NOTE — Progress Notes (Signed)
BP 111/74   Pulse (!) 58   Temp 98 F (36.7 C) (Oral)   Ht 5' 0.98" (1.549 m)   Wt 185 lb 3.2 oz (84 kg)   LMP  (LMP Unknown)   SpO2 97%   BMI 35.01 kg/m    Subjective:    Patient ID: Bethany Lara, female    DOB: 12-Nov-1950, 70 y.o.   MRN: [id number]  HPI: Bethany Lara is a 70 y.o. female  Chief Complaint  Patient presents with   Rash    Left lower leg, first noticed rash on Tuesday afternoon. Rash is tender and bright red, itch associated with it   Hip Pain    Has been having Left hip pain since a fall a few months ago   RASH Patient states that on Tuesday afternoon she noticed some redness on the lower part of her left leg.  It doesn't itch but it sore to the touch.  No open wound.  Did not hit her leg on anything.    HIP PAIN Patient states she fell a couple of months ago and since then she has intermittent hip pain.  After she has been sitting for awhile and gets up she notices pain in her buttock area.    Relevant past medical, surgical, family and social history reviewed and updated as indicated. Interim medical history since our last visit reviewed. Allergies and medications reviewed and updated.  Review of Systems  Musculoskeletal:        Left hip pain/buttocks   Skin:  Positive for rash.   Per HPI unless specifically indicated above     Objective:    BP 111/74   Pulse (!) 58   Temp 98 F (36.7 C) (Oral)   Ht 5' 0.98" (1.549 m)   Wt 185 lb 3.2 oz (84 kg)   LMP  (LMP Unknown)   SpO2 97%   BMI 35.01 kg/m   Wt Readings from Last 3 Encounters:  05/18/21 185 lb 3.2 oz (84 kg)  12/09/20 193 lb (87.5 kg)  08/08/20 178 lb (80.7 kg)    Physical Exam Vitals and nursing note reviewed.  Constitutional:      General: She is not in acute distress.    Appearance: Normal appearance. She is normal weight. She is not ill-appearing, toxic-appearing or diaphoretic.  HENT:     Head: Normocephalic.     Right Ear: External ear normal.     Left Ear:  External ear normal.     Nose: Nose normal.     Mouth/Throat:     Mouth: Mucous membranes are moist.     Pharynx: Oropharynx is clear.  Eyes:     General:        Right eye: No discharge.        Left eye: No discharge.     Extraocular Movements: Extraocular movements intact.     Conjunctiva/sclera: Conjunctivae normal.     Pupils: Pupils are equal, round, and reactive to light.  Cardiovascular:     Rate and Rhythm: Normal rate and regular rhythm.     Heart sounds: No murmur heard. Pulmonary:     Effort: Pulmonary effort is normal. No respiratory distress.     Breath sounds: Normal breath sounds. No wheezing or rales.  Musculoskeletal:     Cervical back: Normal range of motion and neck supple.  Skin:    General: Skin is warm and dry.     Capillary Refill: Capillary refill takes less than  2 seconds.       Neurological:     General: No focal deficit present.     Mental Status: She is alert and oriented to person, place, and time. Mental status is at baseline.  Psychiatric:        Mood and Affect: Mood normal.        Behavior: Behavior normal.        Thought Content: Thought content normal.        Judgment: Judgment normal.    Results for orders placed or performed in visit on 12/12/20  HgB A1c  Result Value Ref Range   Hgb A1c MFr Bld 6.1 (H) 4.8 - 5.6 %   Est. average glucose Bld gHb Est-mCnc 128 mg/dL  Lipid Profile  Result Value Ref Range   Cholesterol, Total 240 (H) 100 - 199 mg/dL   Triglycerides 174 (H) 0 - 149 mg/dL   HDL 54 >39 mg/dL   VLDL Cholesterol Cal 31 5 - 40 mg/dL   LDL Chol Calc (NIH) 155 (H) 0 - 99 mg/dL   Chol/HDL Ratio 4.4 0.0 - 4.4 ratio  Basic Metabolic Panel (BMET)  Result Value Ref Range   Glucose 112 (H) 65 - 99 mg/dL   BUN 17 8 - 27 mg/dL   Creatinine, Ser 0.97 0.57 - 1.00 mg/dL   eGFR 62 >59 mL/min/1.73   BUN/Creatinine Ratio 18 12 - 28   Sodium 140 134 - 144 mmol/L   Potassium 4.1 3.5 - 5.2 mmol/L   Chloride 103 96 - 106 mmol/L   CO2  21 20 - 29 mmol/L   Calcium 9.6 8.7 - 10.3 mg/dL      Assessment & Plan:   Problem List Items Addressed This Visit   None Visit Diagnoses     Cellulitis of left lower extremity    -  Primary   Will treat patient with Bactrim. Discussed s/e and benefits of medication with patient. Discussed s/s to monitor for and when to seek higher level of care.    Sciatica of left side       Stretches given to patient to help with pain.  Discussed Sciatica with patient during visit. Follow up if symptoms worsen or do not improve.         Follow up plan: Return if symptoms worsen or fail to improve.

## 2021-06-12 ENCOUNTER — Ambulatory Visit: Payer: Medicare Other | Admitting: Nurse Practitioner

## 2021-06-20 NOTE — Progress Notes (Signed)
BP 129/79   Pulse (!) 58   Ht 5' (1.524 m)   Wt 184 lb (83.5 kg)   LMP  (LMP Unknown)   BMI 35.94 kg/m    Subjective:    Patient ID: Bethany Lara, female    DOB: Jan 27, 1951, 70 y.o.   MRN: [id number]  HPI: Bethany Lara is a 70 y.o. female  Chief Complaint  Patient presents with   Follow-up   HYPERLIPIDEMIA Hyperlipidemia status: excellent compliance Satisfied with current treatment?  no Side effects:  no Medication compliance: excellent compliance Past cholesterol meds: pravastatin (pravachol) and fenofibrate Supplements: none Aspirin:   plavix The 10-year ASCVD risk score (Arnett DK, et al., 2019) is: 12.4%   Values used to calculate the score:     Age: 54 years     Sex: Female     Is Non-Hispanic African American: No     Diabetic: No     Tobacco smoker: No     Systolic Blood Pressure: 726 mmHg     Is BP treated: No     HDL Cholesterol: 54 mg/dL     Total Cholesterol: 240 mg/dL Chest pain:  no Coronary artery disease:  no Family history CAD:  no Family history early CAD:  no  Denies HA, CP, SOB, dizziness, palpitations, visual changes, and lower extremity swelling.   Relevant past medical, surgical, family and social history reviewed and updated as indicated. Interim medical history since our last visit reviewed. Allergies and medications reviewed and updated.  Review of Systems  Eyes:  Negative for visual disturbance.  Respiratory:  Negative for cough, chest tightness and shortness of breath.   Cardiovascular:  Negative for chest pain, palpitations and leg swelling.  Neurological:  Negative for dizziness and headaches.   Per HPI unless specifically indicated above     Objective:    BP 129/79   Pulse (!) 58   Ht 5' (1.524 m)   Wt 184 lb (83.5 kg)   LMP  (LMP Unknown)   BMI 35.94 kg/m   Wt Readings from Last 3 Encounters:  06/21/21 184 lb (83.5 kg)  05/18/21 185 lb 3.2 oz (84 kg)  12/09/20 193 lb (87.5 kg)    Physical Exam Vitals  and nursing note reviewed.  Constitutional:      General: She is not in acute distress.    Appearance: Normal appearance. She is normal weight. She is not ill-appearing, toxic-appearing or diaphoretic.  HENT:     Head: Normocephalic.     Right Ear: External ear normal.     Left Ear: External ear normal.     Nose: Nose normal.     Mouth/Throat:     Mouth: Mucous membranes are moist.     Pharynx: Oropharynx is clear.  Eyes:     General:        Right eye: No discharge.        Left eye: No discharge.     Extraocular Movements: Extraocular movements intact.     Conjunctiva/sclera: Conjunctivae normal.     Pupils: Pupils are equal, round, and reactive to light.  Cardiovascular:     Rate and Rhythm: Normal rate and regular rhythm.     Heart sounds: No murmur heard. Pulmonary:     Effort: Pulmonary effort is normal. No respiratory distress.     Breath sounds: Normal breath sounds. No wheezing or rales.  Musculoskeletal:     Cervical back: Normal range of motion and neck supple.  Skin:  General: Skin is warm and dry.     Capillary Refill: Capillary refill takes less than 2 seconds.  Neurological:     General: No focal deficit present.     Mental Status: She is alert and oriented to person, place, and time. Mental status is at baseline.  Psychiatric:        Mood and Affect: Mood normal.        Behavior: Behavior normal.        Thought Content: Thought content normal.        Judgment: Judgment normal.    Results for orders placed or performed in visit on 12/12/20  HgB A1c  Result Value Ref Range   Hgb A1c MFr Bld 6.1 (H) 4.8 - 5.6 %   Est. average glucose Bld gHb Est-mCnc 128 mg/dL  Lipid Profile  Result Value Ref Range   Cholesterol, Total 240 (H) 100 - 199 mg/dL   Triglycerides 174 (H) 0 - 149 mg/dL   HDL 54 >39 mg/dL   VLDL Cholesterol Cal 31 5 - 40 mg/dL   LDL Chol Calc (NIH) 155 (H) 0 - 99 mg/dL   Chol/HDL Ratio 4.4 0.0 - 4.4 ratio  Basic Metabolic Panel (BMET)   Result Value Ref Range   Glucose 112 (H) 65 - 99 mg/dL   BUN 17 8 - 27 mg/dL   Creatinine, Ser 0.97 0.57 - 1.00 mg/dL   eGFR 62 >59 mL/min/1.73   BUN/Creatinine Ratio 18 12 - 28   Sodium 140 134 - 144 mmol/L   Potassium 4.1 3.5 - 5.2 mmol/L   Chloride 103 96 - 106 mmol/L   CO2 21 20 - 29 mmol/L   Calcium 9.6 8.7 - 10.3 mg/dL      Assessment & Plan:   Problem List Items Addressed This Visit       Endocrine   IFG (impaired fasting glucose) - Primary    Chronic.  Controlled without medication.  Labs ordered today.  Will make further recommendations based on lab results.  Return to clinic in 6 months for reevaluation.  Call sooner if concerns arise.        Relevant Orders   Comp Met (CMET)   HgB A1c     Other   Hyperlipidemia    Chronic.  Controlled.  Continue with current medication regimen of Fenofibrate 137m and Pravastatin 443mdaily.  Labs ordered today.  Refills sent for patient today.  Return to clinic in 6 months for reevaluation.  Call sooner if concerns arise.        Relevant Medications   fenofibrate 160 MG tablet   pravastatin (PRAVACHOL) 40 MG tablet   Other Relevant Orders   Lipid Profile     Follow up plan: Return in about 6 months (around 12/19/2021) for Physical and Fasting labs.

## 2021-06-21 ENCOUNTER — Ambulatory Visit (INDEPENDENT_AMBULATORY_CARE_PROVIDER_SITE_OTHER): Payer: Medicare Other | Admitting: Nurse Practitioner

## 2021-06-21 ENCOUNTER — Encounter: Payer: Self-pay | Admitting: Nurse Practitioner

## 2021-06-21 ENCOUNTER — Other Ambulatory Visit: Payer: Self-pay

## 2021-06-21 VITALS — BP 129/79 | HR 58 | Ht 60.0 in | Wt 184.0 lb

## 2021-06-21 DIAGNOSIS — R7301 Impaired fasting glucose: Secondary | ICD-10-CM

## 2021-06-21 DIAGNOSIS — E7849 Other hyperlipidemia: Secondary | ICD-10-CM | POA: Diagnosis not present

## 2021-06-21 MED ORDER — PRAVASTATIN SODIUM 40 MG PO TABS: 40.0000 mg | ORAL_TABLET | Freq: Every day | ORAL | 1 refills | Status: DC

## 2021-06-21 MED ORDER — FENOFIBRATE 160 MG PO TABS: 160.0000 mg | ORAL_TABLET | Freq: Every day | ORAL | 1 refills | Status: DC

## 2021-06-21 NOTE — Assessment & Plan Note (Signed)
Chronic.  Controlled without medication.  Labs ordered today.  Will make further recommendations based on lab results.  Return to clinic in 6 months for reevaluation.  Call sooner if concerns arise.

## 2021-06-21 NOTE — Assessment & Plan Note (Signed)
Chronic.  Controlled.  Continue with current medication regimen of Fenofibrate 160mg  and Pravastatin 40mg  daily.  Labs ordered today.  Refills sent for patient today.  Return to clinic in 6 months for reevaluation.  Call sooner if concerns arise.

## 2021-06-22 LAB — LIPID PANEL
Chol/HDL Ratio: 3.9 ratio (ref 0.0–4.4)
Cholesterol, Total: 225 mg/dL — ABNORMAL HIGH (ref 100–199)
HDL: 58 mg/dL (ref >39)
LDL Chol Calc (NIH): 142 mg/dL — ABNORMAL HIGH (ref 0–99)
Triglycerides: 141 mg/dL (ref 0–149)
VLDL Cholesterol Cal: 25 mg/dL (ref 5–40)

## 2021-06-22 LAB — HEMOGLOBIN A1C
Est. average glucose Bld gHb Est-mCnc: 117 mg/dL
Hgb A1c MFr Bld: 5.7 % — ABNORMAL HIGH (ref 4.8–5.6)

## 2021-06-22 LAB — COMPREHENSIVE METABOLIC PANEL
ALT: 16 IU/L (ref 0–32)
AST: 17 IU/L (ref 0–40)
Albumin/Globulin Ratio: 2.3 — ABNORMAL HIGH (ref 1.2–2.2)
Albumin: 4.8 g/dL — ABNORMAL HIGH (ref 3.7–4.7)
Alkaline Phosphatase: 72 IU/L (ref 44–121)
BUN/Creatinine Ratio: 16 (ref 12–28)
BUN: 16 mg/dL (ref 8–27)
Bilirubin Total: 0.4 mg/dL (ref 0.0–1.2)
CO2: 21 mmol/L (ref 20–29)
Calcium: 9.5 mg/dL (ref 8.7–10.3)
Chloride: 105 mmol/L (ref 96–106)
Creatinine, Ser: 0.98 mg/dL (ref 0.57–1.00)
Globulin, Total: 2.1 g/dL (ref 1.5–4.5)
Glucose: 105 mg/dL — ABNORMAL HIGH (ref 70–99)
Potassium: 4.3 mmol/L (ref 3.5–5.2)
Sodium: 142 mmol/L (ref 134–144)
Total Protein: 6.9 g/dL (ref 6.0–8.5)
eGFR: 61 mL/min/{1.73_m2} (ref >59)

## 2021-06-22 NOTE — Progress Notes (Signed)
Hi Bethany Lara.  It was nice to see you yesterday.  Your blood work shows that your A1c has improved to 5.7 which is great news.  Keep up the great work.  Your cholesterol has also improved some.  Continue with your current medication regimen.  Please let me know if you have any questions.  We will recheck your lab work at your next visit.

## 2021-07-17 ENCOUNTER — Encounter: Payer: Self-pay | Admitting: Nurse Practitioner

## 2021-07-17 ENCOUNTER — Telehealth (INDEPENDENT_AMBULATORY_CARE_PROVIDER_SITE_OTHER): Payer: Medicare Other | Admitting: Nurse Practitioner

## 2021-07-17 VITALS — Temp 99.5°F

## 2021-07-17 DIAGNOSIS — U071 COVID-19: Secondary | ICD-10-CM | POA: Insufficient documentation

## 2021-07-17 MED ORDER — PREDNISONE 10 MG PO TABS: 10.0000 mg | ORAL_TABLET | Freq: Every day | ORAL | 0 refills | Status: DC

## 2021-07-17 NOTE — Telephone Encounter (Signed)
Patient has been scheduled today at 4:20pm for a MyChart visit.

## 2021-07-17 NOTE — Assessment & Plan Note (Signed)
Tested positive and symptoms started on Wednesday.  Patient declines cough medicine with codeine.  Will send in steroids to help with patient's symptoms.  Continue with over the counter management.  Advised patient on quarantine instructions. Discussed signs and symptoms to monitor for and when to seek higher level of care.

## 2021-07-17 NOTE — Progress Notes (Signed)
Temp 99.5 F (37.5 C)   LMP  (LMP Unknown)    Subjective:    Patient ID: Bethany Lara, female    DOB: Feb 14, 1951, 70 y.o.   MRN: [id number]  HPI: Bethany Lara is a 70 y.o. female  Chief Complaint  Patient presents with   Covid Positive   UPPER RESPIRATORY TRACT INFECTION Worst symptom: Tested positive on Wednesday and symptoms started on Wednesday also Fever: no Cough: yes Shortness of breath: yes Wheezing: no Chest pain: no Chest tightness: no Chest congestion: no Nasal congestion: yes Runny nose: yes Post nasal drip: yes Sneezing: no Sore throat: yes- feels like her throat is better than it was Swollen glands: yes Sinus pressure:  sometimes Headache: yes Face pain: no Toothache: no Ear pain: no bilateral Ear pressure: no bilateral Eyes red/itching:no Eye drainage/crusting: no  Vomiting: no Rash: no Fatigue: yes Sick contacts: no Strep contacts: no  Context: stable Recurrent sinusitis: no Relief with OTC cold/cough medications: yes  Treatments attempted: cold/sinus   Relevant past medical, surgical, family and social history reviewed and updated as indicated. Interim medical history since our last visit reviewed. Allergies and medications reviewed and updated.  Review of Systems  Constitutional:  Positive for fatigue. Negative for fever.  HENT:  Positive for congestion, postnasal drip, rhinorrhea, sinus pressure and sore throat. Negative for dental problem, ear pain, sinus pain and sneezing.   Respiratory:  Positive for cough and shortness of breath. Negative for wheezing.   Cardiovascular:  Negative for chest pain.  Gastrointestinal:  Negative for vomiting.  Skin:  Negative for rash.  Neurological:  Positive for headaches.   Per HPI unless specifically indicated above     Objective:    Temp 99.5 F (37.5 C)   LMP  (LMP Unknown)   Wt Readings from Last 3 Encounters:  06/21/21 184 lb (83.5 kg)  05/18/21 185 lb 3.2 oz (84 kg)   12/09/20 193 lb (87.5 kg)    Physical Exam Vitals and nursing note reviewed.  HENT:     Head: Normocephalic.     Right Ear: Hearing normal.     Left Ear: Hearing normal.     Nose: Nose normal.  Eyes:     Pupils: Pupils are equal, round, and reactive to light.  Pulmonary:     Effort: Pulmonary effort is normal. No respiratory distress.  Neurological:     Mental Status: She is alert.  Psychiatric:        Mood and Affect: Mood normal.        Behavior: Behavior normal.        Thought Content: Thought content normal.        Judgment: Judgment normal.    Results for orders placed or performed in visit on 06/21/21  Comp Met (CMET)  Result Value Ref Range   Glucose 105 (H) 70 - 99 mg/dL   BUN 16 8 - 27 mg/dL   Creatinine, Ser 0.98 0.57 - 1.00 mg/dL   eGFR 61 >59 mL/min/1.73   BUN/Creatinine Ratio 16 12 - 28   Sodium 142 134 - 144 mmol/L   Potassium 4.3 3.5 - 5.2 mmol/L   Chloride 105 96 - 106 mmol/L   CO2 21 20 - 29 mmol/L   Calcium 9.5 8.7 - 10.3 mg/dL   Total Protein 6.9 6.0 - 8.5 g/dL   Albumin 4.8 (H) 3.7 - 4.7 g/dL   Globulin, Total 2.1 1.5 - 4.5 g/dL   Albumin/Globulin Ratio 2.3 (H) 1.2 -  2.2   Bilirubin Total 0.4 0.0 - 1.2 mg/dL   Alkaline Phosphatase 72 44 - 121 IU/L   AST 17 0 - 40 IU/L   ALT 16 0 - 32 IU/L  HgB A1c  Result Value Ref Range   Hgb A1c MFr Bld 5.7 (H) 4.8 - 5.6 %   Est. average glucose Bld gHb Est-mCnc 117 mg/dL  Lipid Profile  Result Value Ref Range   Cholesterol, Total 225 (H) 100 - 199 mg/dL   Triglycerides 141 0 - 149 mg/dL   HDL 58 >39 mg/dL   VLDL Cholesterol Cal 25 5 - 40 mg/dL   LDL Chol Calc (NIH) 142 (H) 0 - 99 mg/dL   Chol/HDL Ratio 3.9 0.0 - 4.4 ratio      Assessment & Plan:   Problem List Items Addressed This Visit       Other   Lab test positive for detection of COVID-19 virus - Primary    Tested positive and symptoms started on Wednesday.  Patient declines cough medicine with codeine.  Will send in steroids to help with  patient's symptoms.  Continue with over the counter management.  Advised patient on quarantine instructions. Discussed signs and symptoms to monitor for and when to seek higher level of care.          Follow up plan: Return if symptoms worsen or fail to improve.   This visit was completed via MyChart due to the restrictions of the COVID-19 pandemic. All issues as above were discussed and addressed. Physical exam was done as above through visual confirmation on MyChart. If it was felt that the patient should be evaluated in the office, they were directed there. The patient verbally consented to this visit. Location of the patient: Home Location of the provider: Office Those involved with this call:  Provider: Jon Billings, NP CMA: None Front Desk/Registration: Myrlene Broker This encounter was conducted via video.  I spent 15 dedicated to the care of this patient on the date of this encounter to include previsit review of 20, face to face time with the patient, and post visit ordering of testing.

## 2021-07-25 ENCOUNTER — Ambulatory Visit (INDEPENDENT_AMBULATORY_CARE_PROVIDER_SITE_OTHER): Payer: Medicare Other | Admitting: Nurse Practitioner

## 2021-07-25 ENCOUNTER — Encounter: Payer: Self-pay | Admitting: Nurse Practitioner

## 2021-07-25 ENCOUNTER — Other Ambulatory Visit: Payer: Self-pay

## 2021-07-25 VITALS — BP 112/75 | HR 111 | Temp 97.1°F | Wt 186.0 lb

## 2021-07-25 DIAGNOSIS — J019 Acute sinusitis, unspecified: Secondary | ICD-10-CM | POA: Diagnosis not present

## 2021-07-25 DIAGNOSIS — H6691 Otitis media, unspecified, right ear: Secondary | ICD-10-CM | POA: Diagnosis not present

## 2021-07-25 MED ORDER — AMOXICILLIN-POT CLAVULANATE 875-125 MG PO TABS: 1.0000 | ORAL_TABLET | Freq: Two times a day (BID) | ORAL | 0 refills | Status: DC

## 2021-07-25 NOTE — Progress Notes (Signed)
Acute Office Visit  Subjective:    Patient ID: Bethany Lara, female    DOB: 03-Feb-1951, 70 y.o.   MRN: [id number]  Chief Complaint  Patient presents with   Sore Throat   Headache   Cough    Cough for 2 weeks. Productive cough with yellow mucous. Some blood when blowing nose. Taking OTC cough/cold medicine   Sinusitis    HPI Patient is in today for follow up on sore throat, headache and cough. She recently tested positive for covid-19 on 07/12/21. She states her cough and sore throat are better, but still endorses sinus pressure, right ear pain, and a sore spot on the roof of her mouth. She is still blowing her nose and has nasal congestion. Denies fevers, shortness of breath, and chest pain. 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    Past Surgical History:  Procedure Laterality Date   BUNIONECTOMY     CARPAL TUNNEL RELEASE  11/14/2015   CARPAL TUNNEL RELEASE Right 06/17/2019   CHOLECYSTECTOMY     EYELID LACERATION REPAIR     Eye Lid Surgery   PEG PLACEMENT N/A 10/30/2017   Procedure: PERCUTANEOUS ENDOSCOPIC GASTROSTOMY (PEG) PLACEMENT;  Surgeon: Lin Landsman, MD;  Location: [facility] ENDOSCOPY;  Service: Gastroenterology;  Laterality: N/A;    Family History  Problem Relation Age of Onset   Diabetes Mother    Hypertension Mother    Dementia Mother    Heart murmur Mother    Diabetes Father    Heart disease Father    Alzheimer's disease Father    Glaucoma Father    Colon cancer Father    Heart attack Father    Breast cancer Maternal Aunt     Social History   Socioeconomic History   Marital status: Married    Spouse name: Not on file   Number of children: Not on file   Years of education: 12th grade   Highest education level: Not on file  Occupational History   Occupation: retired   Tobacco Use   Smoking status: Never   Smokeless tobacco: Never  Vaping Use   Vaping Use: Never used  Substance and Sexual Activity   Alcohol use: No    Alcohol/week: 0.0 standard drinks   Drug use: No   Sexual activity: Yes  Other Topics Concern   Not on file  Social History Narrative   Not on file   Social Determinants of Health   Financial Resource Strain: Low Risk    Difficulty of Paying Living Expenses: Not hard at all  Food Insecurity: No Food Insecurity   Worried About Charity fundraiser in the Last Year: Never true   [place] in the Last Year: Never true  Transportation Needs: No Transportation Needs   Lack of Transportation (Medical): No   Lack of Transportation (Non-Medical): No  Physical Activity: Inactive   Days of Exercise per Week: 0 days   Minutes of Exercise per Session: 0 min  Stress: No Stress Concern Present   Feeling of Stress : Not at all  Social Connections: Not on file  Intimate Partner Violence: Not on file    Outpatient Medications Prior to Visit  Medication Sig Dispense Refill   cetirizine (ZYRTEC) 10 MG tablet Take 1 tablet (10 mg total) by  mouth daily. 90 tablet 1   clopidogrel (PLAVIX) 75 MG tablet TAKE 1 TABLET BY MOUTH  DAILY 90 tablet 3   CO ENZYME Q-10 PO Take by mouth daily.     diclofenac Sodium (VOLTAREN) 1 % GEL Apply 2 g topically 4 (four) times daily. 2 g 1   fenofibrate 160 MG tablet Take 1 tablet (160 mg total) by mouth daily. 90 tablet 1   fluticasone (FLONASE) 50 MCG/ACT nasal spray Place 2 sprays into both nostrils 2 (two) times daily. 16 g 11   MAGNESIUM PO Take by mouth daily.     Multiple Vitamin (MULTIVITAMIN) tablet Place 1 tablet into feeding tube daily. (Patient taking differently: Take 1 tablet by mouth daily.)     NON FORMULARY CPAP @@ bedtime.     pravastatin (PRAVACHOL) 40 MG tablet Take 1 tablet (40 mg total) by mouth daily. 90 tablet 1   Vitamin D, Cholecalciferol, 1000 units TABS Give 2,000 Units by tube  daily. (Patient taking differently: Take 2,000 Units by mouth daily.) 60 tablet    predniSONE (DELTASONE) 10 MG tablet Take 1 tablet (10 mg total) by mouth daily with breakfast. Take 6 tabs the first , 5 tabs the second day and decrease by 1 each day until the course is complete. 21 tablet 0   No facility-administered medications prior to visit.    Allergies  Allergen Reactions   Crestor [Rosuvastatin Calcium] Other (See Comments)    myalgias   Zocor [Simvastatin] Other (See Comments)    Fatigue, legs achey     Review of Systems  Constitutional:  Positive for fatigue. Negative for fever.  HENT:  Positive for congestion, ear pain (right ear), postnasal drip, rhinorrhea, sinus pressure and sinus pain. Negative for sore throat.   Eyes: Negative.   Respiratory:  Positive for cough (improving). Negative for shortness of breath.   Cardiovascular: Negative.   Gastrointestinal: Negative.   Musculoskeletal:  Positive for myalgias.  Skin: Negative.   Neurological:  Positive for headaches. Negative for dizziness.      Objective:    Physical Exam Vitals and nursing note reviewed.  Constitutional:      General: She is not in acute distress.    Appearance: Normal appearance.  HENT:     Head: Normocephalic.     Right Ear: Ear canal normal. Tympanic membrane is erythematous.     Left Ear: Tympanic membrane and ear canal normal.     Mouth/Throat:     Mouth: Mucous membranes are moist.     Pharynx: Oropharynx is clear. No posterior oropharyngeal erythema.  Eyes:     Conjunctiva/sclera: Conjunctivae normal.  Neck:     Comments: Tenderness over frontal and maxillary sinuses Cardiovascular:     Rate and Rhythm: Normal rate and regular rhythm.     Pulses: Normal pulses.     Heart sounds: Normal heart sounds.  Pulmonary:     Effort: Pulmonary effort is normal.     Breath sounds: Normal breath sounds.  Musculoskeletal:     Cervical back: Normal range of motion and neck supple. No  tenderness.  Lymphadenopathy:     Cervical: No cervical adenopathy.  Skin:    General: Skin is warm.  Neurological:     General: No focal deficit present.     Mental Status: She is alert and oriented to person, place, and time.  Psychiatric:        Mood and Affect: Mood normal.        Behavior: Behavior  normal.        Thought Content: Thought content normal.        Judgment: Judgment normal.    BP 112/75   Pulse (!) 111   Temp (!) 97.1 F (36.2 C) (Oral)   Wt 186 lb (84.4 kg)   LMP  (LMP Unknown)   SpO2 98%   BMI 36.33 kg/m  Wt Readings from Last 3 Encounters:  07/25/21 186 lb (84.4 kg)  06/21/21 184 lb (83.5 kg)  05/18/21 185 lb 3.2 oz (84 kg)    Health Maintenance Due  Topic Date Due   COVID-19 Vaccine (3 - Booster for Moderna series) 04/12/2020   INFLUENZA VACCINE  04/24/2021    There are no preventive care reminders to display for this patient.   Lab Results  Component Value Date   TSH 3.180 11/19/2018   Lab Results  Component Value Date   WBC 8.2 05/16/2020   HGB 13.8 05/16/2020   HCT 41.8 05/16/2020   MCV 89 05/16/2020   PLT 274 05/16/2020   Lab Results  Component Value Date   NA 142 06/21/2021   K 4.3 06/21/2021   CO2 21 06/21/2021   GLUCOSE 105 (H) 06/21/2021   BUN 16 06/21/2021   CREATININE 0.98 06/21/2021   BILITOT 0.4 06/21/2021   ALKPHOS 72 06/21/2021   AST 17 06/21/2021   ALT 16 06/21/2021   PROT 6.9 06/21/2021   ALBUMIN 4.8 (H) 06/21/2021   CALCIUM 9.5 06/21/2021   ANIONGAP 10 10/31/2017   EGFR 61 06/21/2021   Lab Results  Component Value Date   CHOL 225 (H) 06/21/2021   Lab Results  Component Value Date   HDL 58 06/21/2021   Lab Results  Component Value Date   LDLCALC 142 (H) 06/21/2021   Lab Results  Component Value Date   TRIG 141 06/21/2021   Lab Results  Component Value Date   CHOLHDL 3.9 06/21/2021   Lab Results  Component Value Date   HGBA1C 5.7 (H) 06/21/2021       Assessment & Plan:   Problem List  Items Addressed This Visit   None Visit Diagnoses     Acute non-recurrent sinusitis, unspecified location    -  Primary   Diagnosed with covid-19 2 weeks ago. With ongoing sinus pain and congestion will treat for sinusitis with augmentin. Cont. tylenol cold/sinus, fluids, flonase   Relevant Medications   amoxicillin-clavulanate (AUGMENTIN) 875-125 MG tablet   Right otitis media, unspecified otitis media type       Will treat with augmentin as stated above for sinusitis. Discussed avoiding nyquil/dayquil and tylenol sinus together as need to limit tylenol < 3,086m daily   Relevant Medications   amoxicillin-clavulanate (AUGMENTIN) 875-125 MG tablet        Meds ordered this encounter  Medications   amoxicillin-clavulanate (AUGMENTIN) 875-125 MG tablet    Sig: Take 1 tablet by mouth 2 (two) times daily.    Dispense:  14 tablet    Refill:  0     LCharyl Dancer NP

## 2021-08-09 ENCOUNTER — Ambulatory Visit: Payer: Medicare Other

## 2021-08-09 ENCOUNTER — Ambulatory Visit (INDEPENDENT_AMBULATORY_CARE_PROVIDER_SITE_OTHER): Payer: Medicare Other | Admitting: *Deleted

## 2021-08-09 DIAGNOSIS — Z0000 Encounter for general adult medical examination without abnormal findings: Secondary | ICD-10-CM

## 2021-08-09 NOTE — Progress Notes (Signed)
Subjective:   Bethany Lara is a 70 y.o. female who presents for Medicare Annual (Subsequent) preventive examination.  I connected with  Bethany Lara on 08/09/21 by a telephone enabled telemedicine application and verified that I am speaking with the correct person using two identifiers.   I discussed the limitations of evaluation and management by telemedicine. The patient expressed understanding and agreed to proceed.  Patient location: home  Provider location: tele-health  not in office    Review of Systems     Cardiac Risk Factors include: advanced age (>91men, >64 women);sedentary lifestyle;obesity (BMI >30kg/m2)     Objective:    Today's Vitals   There is no height or weight on file to calculate BMI.  Advanced Directives 08/09/2021 08/08/2020 07/21/2018 10/30/2017 10/26/2017 10/26/2017 07/18/2017  Does Patient Have a Medical Advance Directive? Yes Yes Yes - Yes Yes Yes  Type of Paramedic of [place];Living will [place];Living will [place];Living will [place];Living will - [place];Living will  Does patient want to make changes to medical advance directive? - - - - No - Patient declined - No - Patient declined  Copy of [place] in Chart? Yes - validated most recent copy scanned in chart (See row information) Yes - validated most recent copy scanned in chart (See row information) No - copy requested No - copy requested No - copy requested - No - copy requested    Current Medications (verified) Outpatient Encounter Medications as of 08/09/2021  Medication Sig   cetirizine (ZYRTEC) 10 MG tablet Take 1 tablet (10 mg total) by mouth daily.   clopidogrel (PLAVIX) 75 MG tablet TAKE 1 TABLET BY MOUTH  DAILY   CO ENZYME Q-10 PO Take by mouth daily.   diclofenac Sodium (VOLTAREN) 1 % GEL Apply 2 g topically 4 (four) times  daily.   fenofibrate 160 MG tablet Take 1 tablet (160 mg total) by mouth daily.   fluticasone (FLONASE) 50 MCG/ACT nasal spray Place 2 sprays into both nostrils 2 (two) times daily.   MAGNESIUM PO Take by mouth daily.   Multiple Vitamin (MULTIVITAMIN) tablet Place 1 tablet into feeding tube daily. (Patient taking differently: Take 1 tablet by mouth daily.)   NON FORMULARY CPAP @@ bedtime.   pravastatin (PRAVACHOL) 40 MG tablet Take 1 tablet (40 mg total) by mouth daily.   Vitamin D, Cholecalciferol, 1000 units TABS Give 2,000 Units by tube daily. (Patient taking differently: Take 2,000 Units by mouth daily.)   amoxicillin-clavulanate (AUGMENTIN) 875-125 MG tablet Take 1 tablet by mouth 2 (two) times daily.   predniSONE (DELTASONE) 10 MG tablet Take 1 tablet (10 mg total) by mouth daily with breakfast. Take 6 tabs the first , 5 tabs the second day and decrease by 1 each day until the course is complete.   No facility-administered encounter medications on file as of 08/09/2021.    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   Past Surgical History:  Procedure Laterality Date   BUNIONECTOMY     CARPAL TUNNEL RELEASE  11/14/2015   CARPAL TUNNEL RELEASE Right 06/17/2019   CHOLECYSTECTOMY     EYELID LACERATION REPAIR     Eye Lid Surgery   PEG PLACEMENT  N/A 10/30/2017   Procedure: PERCUTANEOUS ENDOSCOPIC GASTROSTOMY (PEG) PLACEMENT;  Surgeon: Lin Landsman, MD;  Location: [facility] ENDOSCOPY;  Service: Gastroenterology;  Laterality: N/A;   Family History  Problem Relation Age of Onset   Diabetes Mother    Hypertension Mother    Dementia Mother    Heart murmur Mother    Diabetes Father    Heart disease Father    Alzheimer's disease Father    Glaucoma Father    Colon cancer Father    Heart attack  Father    Breast cancer Maternal Aunt    Social History   Socioeconomic History   Marital status: Married    Spouse name: Not on file   Number of children: Not on file   Years of education: 12th grade   Highest education level: Not on file  Occupational History   Occupation: retired  Tobacco Use   Smoking status: Never   Smokeless tobacco: Never  Vaping Use   Vaping Use: Never used  Substance and Sexual Activity   Alcohol use: No    Alcohol/week: 0.0 standard drinks   Drug use: No   Sexual activity: Yes  Other Topics Concern   Not on file  Social History Narrative   Not on file   Social Determinants of Health   Financial Resource Strain: Low Risk    Difficulty of Paying Living Expenses: Not hard at all  Food Insecurity: No Food Insecurity   Worried About Charity fundraiser in the Last Year: Never true   University in the Last Year: Never true  Transportation Needs: No Transportation Needs   Lack of Transportation (Medical): No   Lack of Transportation (Non-Medical): No  Physical Activity: Inactive   Days of Exercise per Week: 0 days   Minutes of Exercise per Session: 0 min  Stress: Stress Concern Present   Feeling of Stress : To some extent  Social Connections: Engineer, building services of Communication with Friends and Family: More than three times a week   Frequency of Social Gatherings with Friends and Family: Twice a week   Attends Religious Services: More than 4 times per year   Active Member of [employer] or Organizations: Yes   Attends Archivist Meetings: 1 to 4 times per year   Marital Status: Married    Tobacco Counseling Counseling given: Not Answered   Clinical Intake:  Pre-visit preparation completed: Yes  Pain : No/denies pain     Nutritional Risks: None Diabetes: No  How often do you need to have someone help you when you read instructions, pamphlets, or other written materials from your doctor or pharmacy?: 1 -  Never  Diabetic?   no  Interpreter Needed?: No  Information entered by :: Leroy Kennedy LPN   Activities of Daily Living In your present state of health, do you have any difficulty performing the following activities: 08/09/2021 08/09/2021  Hearing? N N  Vision? N N  Difficulty concentrating or making decisions? N N  Walking or climbing stairs? N -  Dressing or bathing? N -  Doing errands, shopping? N -  Preparing Food and eating ? N -  Using the Toilet? N -  In the past six months, have you accidently leaked urine? N -  Do you have problems with loss of bowel control? N -  Managing your Medications? N -  Managing your Finances? N -  Housekeeping or managing your Housekeeping? N -  Some recent data  might be hidden    Patient Care Team: Jon Billings, NP as PCP - General Kathrine Haddock, NP as PCP - Family Medicine (Nurse Practitioner) Greg Cutter, LCSW as [place] Management (Licensed Clinical Social Worker)  Indicate any recent [place] you may have received from other than [facility] providers in the past year (date may be approximate).     Assessment:   This is a routine wellness examination for [place].  Hearing/Vision screen Hearing Screening - Comments:: No trouble hearing Vision Screening - Comments:: Up to date Dr. Edison Pace    Dietary issues and exercise activities discussed: Current Exercise Habits: The patient does not participate in regular exercise at present, Exercise limited by: None identified   Goals Addressed             This Visit's Progress    Patient Stated   Not on track    08/08/2020, wants to lose weight     Weight (lb) < 200 lb (90.7 kg)         Depression Screen PHQ 2/9 Scores 07/25/2021 05/18/2021 12/09/2020 08/08/2020 11/23/2019 07/27/2019 11/19/2018  PHQ - 2 Score 1 0 0 0 0 0 0  PHQ- 9 Score 5 - - - 1 - 2    Fall Risk Fall Risk  08/09/2021 05/18/2021 12/09/2020 08/08/2020 11/23/2019  Falls in the past year? 1 1  0 0 0  Number falls in past yr: 0 1 0 - 0  Injury with Fall? 0 1 0 - 0  Risk for fall due to : - - No Fall Risks Medication side effect -  Follow up Falls evaluation completed;Falls prevention discussed Falls evaluation completed Falls evaluation completed Falls evaluation completed;Education provided;Falls prevention discussed -    FALL RISK PREVENTION PERTAINING TO THE HOME:  Any stairs in or around the home? No  If so, are there any without handrails? No  Home free of loose throw rugs in walkways, pet beds, electrical cords, etc? Yes  Adequate lighting in your home to reduce risk of falls? Yes   ASSISTIVE DEVICES UTILIZED TO PREVENT FALLS:  Life alert? No  Use of a cane, walker or w/c? No  Grab bars in the bathroom? Yes  Shower chair or bench in shower? Yes  Elevated toilet seat or a handicapped toilet? Yes   TIMED UP AND GO:  Was the test performed? No .    Cognitive Function:  Normal cognitive status assessed by direct observation by this Nurse Health Advisor. No abnormalities found.       6CIT Screen 08/08/2020 07/21/2018 07/17/2017  What Year? 0 points 0 points 0 points  What month? 0 points 0 points 0 points  What time? 0 points 0 points 0 points  Count back from 20 0 points 0 points 0 points  Months in reverse 0 points 0 points 0 points  Repeat phrase 0 points 2 points 2 points  Total Score 0 2 2    Immunizations Immunization History  Administered Date(s) Administered   Influenza, High Dose Seasonal PF 07/23/2016   Influenza,inj,Quad PF,6+ Mos 11/28/2015   Moderna Sars-Covid-2 Vaccination 01/19/2020, 02/16/2020   Pneumococcal Conjugate-13 07/13/2014   Pneumococcal Polysaccharide-23 01/20/2016   Td 11/20/2005   Zoster, Live 05/09/2010    TDAP status: Due, Education has been provided regarding the importance of this vaccine. Advised may receive this vaccine at local pharmacy or Health Dept. Aware to provide a copy of the vaccination record if obtained from  local pharmacy or Health Dept.  Verbalized acceptance and understanding.  Flu Vaccine status: Due, Education has been provided regarding the importance of this vaccine. Advised may receive this vaccine at local pharmacy or Health Dept. Aware to provide a copy of the vaccination record if obtained from local pharmacy or Health Dept. Verbalized acceptance and understanding.  Pneumococcal vaccine status: Up to date  Covid-19 vaccine status: Information provided on how to obtain vaccines.   Qualifies for Shingles Vaccine? Yes   Zostavax completed Yes   Shingrix Completed?: No.    Education has been provided regarding the importance of this vaccine. Patient has been advised to call insurance company to determine out of pocket expense if they have not yet received this vaccine. Advised may also receive vaccine at local pharmacy or Health Dept. Verbalized acceptance and understanding.  Screening Tests Health Maintenance  Topic Date Due   INFLUENZA VACCINE  04/24/2021   Zoster Vaccines- Shingrix (1 of 2) 08/18/2021 (Originally 05/16/1999)   TETANUS/TDAP  05/18/2022 (Originally 11/21/2015)   COVID-19 Vaccine (3 - Booster for Moderna series) 07/31/2022 (Originally 04/12/2020)   Fecal DNA (Cologuard)  11/25/2021   MAMMOGRAM  12/28/2021   Pneumonia Vaccine 83+ Years old  Completed   DEXA SCAN  Completed   Hepatitis C Screening  Completed   HPV VACCINES  Aged Out    Health Maintenance  Health Maintenance Due  Topic Date Due   INFLUENZA VACCINE  04/24/2021    Colorectal cancer screening: Type of screening: Cologuard. Completed 2020. Repeat every 3 years  Mammogram status: Completed 2022. Repeat every year  Bone Density status: Completed  . Results reflect: Bone density results: NORMAL. Repeat every 0 years.  Lung Cancer Screening: (Low Dose CT Chest recommended if Age 36-80 years, 30 pack-year currently smoking OR have quit w/in 15years.) does not qualify.   Lung Cancer Screening Referral:    Additional Screening:  Hepatitis C Screening: does not qualify; Completed 2016  Vision Screening: Recommended annual ophthalmology exams for early detection of glaucoma and other disorders of the eye. Is the patient up to date with their annual eye exam?  Yes  Who is the provider or what is the name of the office in which the patient attends annual eye exams? Dr. Edison Pace If pt is not established with a provider, would they like to be referred to a provider to establish care? No .   Dental Screening: Recommended annual dental exams for proper oral hygiene  Community Resource Referral / Chronic Care Management: CRR required this visit?  No   CCM required this visit?  No      Plan:     I have personally reviewed and noted the following in the patient's chart:   Medical and social history Use of alcohol, tobacco or illicit drugs  Current medications and supplements including opioid prescriptions.  Functional ability and status Nutritional status Physical activity Advanced directives List of other physicians Hospitalizations, surgeries, and ER visits in previous 12 months Vitals Screenings to include cognitive, depression, and falls Referrals and appointments  In addition, I have reviewed and discussed with patient certain preventive protocols, quality metrics, and best practice recommendations. A written personalized care plan for preventive services as well as general preventive health recommendations were provided to patient.     Leroy Kennedy, LPN   [id number]   Nurse Notes:

## 2021-08-09 NOTE — Patient Instructions (Addendum)
Bethany Lara , Thank you for taking time to come for your Medicare Wellness Visit. I appreciate your ongoing commitment to your health goals. Please review the following plan we discussed and let me know if I can assist you in the future.   Screening recommendations/referrals: Colonoscopy: up to date Mammogram: up to date Bone Density: up to date Recommended yearly ophthalmology/optometry visit for glaucoma screening and checkup Recommended yearly dental visit for hygiene and checkup  Vaccinations: Influenza vaccine: Education provided Pneumococcal vaccine: up to date Tdap vaccine: Education provided Shingles vaccine: Education provided    Advanced directives: Education provided  Conditions/risks identified:   Next appointment: 12-20-2021 8:00 [facility] 43 Years and Older, Female Preventive care refers to lifestyle choices and visits with your health care provider that can promote health and wellness. What does preventive care include? A yearly physical exam. This is also called an annual well check. Dental exams once or twice a year. Routine eye exams. Ask your health care provider how often you should have your eyes checked. Personal lifestyle choices, including: Daily care of your teeth and gums. Regular physical activity. Eating a healthy diet. Avoiding tobacco and drug use. Limiting alcohol use. Practicing safe sex. Taking low-dose aspirin every day. Taking vitamin and mineral supplements as recommended by your health care provider. What happens during an annual well check? The services and screenings done by your health care provider during your annual well check will depend on your age, overall health, lifestyle risk factors, and family history of disease. Counseling  Your health care provider may ask you questions about your: Alcohol use. Tobacco use. Drug use. Emotional well-being. Home and relationship well-being. Sexual activity. Eating  habits. History of falls. Memory and ability to understand (cognition). Work and work Statistician. Reproductive health. Screening  You may have the following tests or measurements: Height, weight, and BMI. Blood pressure. Lipid and cholesterol levels. These may be checked every 5 years, or more frequently if you are over 38 years old. Skin check. Lung cancer screening. You may have this screening every year starting at age 102 if you have a 30-pack-year history of smoking and currently smoke or have quit within the past 15 years. Fecal occult blood test (FOBT) of the stool. You may have this test every year starting at age 57. Flexible sigmoidoscopy or colonoscopy. You may have a sigmoidoscopy every 5 years or a colonoscopy every 10 years starting at age 58. Hepatitis C blood test. Hepatitis B blood test. Sexually transmitted disease (STD) testing. Diabetes screening. This is done by checking your blood sugar (glucose) after you have not eaten for a while (fasting). You may have this done every 1-3 years. Bone density scan. This is done to screen for osteoporosis. You may have this done starting at age 39. Mammogram. This may be done every 1-2 years. Talk to your health care provider about how often you should have regular mammograms. Talk with your health care provider about your test results, treatment options, and if necessary, the need for more tests. Vaccines  Your health care provider may recommend certain vaccines, such as: Influenza vaccine. This is recommended every year. Tetanus, diphtheria, and acellular pertussis (Tdap, Td) vaccine. You may need a Td booster every 10 years. Zoster vaccine. You may need this after age 64. Pneumococcal 13-valent conjugate (PCV13) vaccine. One dose is recommended after age 81. Pneumococcal polysaccharide (PPSV23) vaccine. One dose is recommended after age 59. Talk to your health care provider about which screenings  and vaccines you need and how  often you need them. This information is not intended to replace advice given to you by your health care provider. Make sure you discuss any questions you have with your health care provider. Document Released: 10/07/2015 Document Revised: 05/30/2016 Document Reviewed: 07/12/2015 Elsevier Interactive Patient Education  2017 [place] Prevention in the Home Falls can cause injuries. They can happen to people of all ages. There are many things you can do to make your home safe and to help prevent falls. What can I do on the outside of my home? Regularly fix the edges of walkways and driveways and fix any cracks. Remove anything that might make you trip as you walk through a door, such as a raised step or threshold. Trim any bushes or trees on the path to your home. Use bright outdoor lighting. Clear any walking paths of anything that might make someone trip, such as rocks or tools. Regularly check to see if handrails are loose or broken. Make sure that both sides of any steps have handrails. Any raised decks and porches should have guardrails on the edges. Have any leaves, snow, or ice cleared regularly. Use sand or salt on walking paths during winter. Clean up any spills in your garage right away. This includes oil or grease spills. What can I do in the bathroom? Use night lights. Install grab bars by the toilet and in the tub and shower. Do not use towel bars as grab bars. Use non-skid mats or decals in the tub or shower. If you need to sit down in the shower, use a plastic, non-slip stool. Keep the floor dry. Clean up any water that spills on the floor as soon as it happens. Remove soap buildup in the tub or shower regularly. Attach bath mats securely with double-sided non-slip rug tape. Do not have throw rugs and other things on the floor that can make you trip. What can I do in the bedroom? Use night lights. Make sure that you have a light by your bed that is easy to  reach. Do not use any sheets or blankets that are too big for your bed. They should not hang down onto the floor. Have a firm chair that has side arms. You can use this for support while you get dressed. Do not have throw rugs and other things on the floor that can make you trip. What can I do in the kitchen? Clean up any spills right away. Avoid walking on wet floors. Keep items that you use a lot in easy-to-reach places. If you need to reach something above you, use a strong step stool that has a grab bar. Keep electrical cords out of the way. Do not use floor polish or wax that makes floors slippery. If you must use wax, use non-skid floor wax. Do not have throw rugs and other things on the floor that can make you trip. What can I do with my stairs? Do not leave any items on the stairs. Make sure that there are handrails on both sides of the stairs and use them. Fix handrails that are broken or loose. Make sure that handrails are as long as the stairways. Check any carpeting to make sure that it is firmly attached to the stairs. Fix any carpet that is loose or worn. Avoid having throw rugs at the top or bottom of the stairs. If you do have throw rugs, attach them to the floor with carpet tape.  Make sure that you have a light switch at the top of the stairs and the bottom of the stairs. If you do not have them, ask someone to add them for you. What else can I do to help prevent falls? Wear shoes that: Do not have high heels. Have rubber bottoms. Are comfortable and fit you well. Are closed at the toe. Do not wear sandals. If you use a stepladder: Make sure that it is fully opened. Do not climb a closed stepladder. Make sure that both sides of the stepladder are locked into place. Ask someone to hold it for you, if possible. Clearly mark and make sure that you can see: Any grab bars or handrails. First and last steps. Where the edge of each step is. Use tools that help you move  around (mobility aids) if they are needed. These include: Canes. Walkers. Scooters. Crutches. Turn on the lights when you go into a dark area. Replace any light bulbs as soon as they burn out. Set up your furniture so you have a clear path. Avoid moving your furniture around. If any of your floors are uneven, fix them. If there are any pets around you, be aware of where they are. Review your medicines with your doctor. Some medicines can make you feel dizzy. This can increase your chance of falling. Ask your doctor what other things that you can do to help prevent falls. This information is not intended to replace advice given to you by your health care provider. Make sure you discuss any questions you have with your health care provider. Document Released: 07/07/2009 Document Revised: 02/16/2016 Document Reviewed: 10/15/2014 Elsevier Interactive Patient Education  2017 [employer].

## 2021-08-11 ENCOUNTER — Ambulatory Visit: Payer: Medicare Other

## 2021-10-04 ENCOUNTER — Other Ambulatory Visit: Payer: Self-pay

## 2021-10-04 ENCOUNTER — Ambulatory Visit (INDEPENDENT_AMBULATORY_CARE_PROVIDER_SITE_OTHER): Payer: Medicare Other | Admitting: Nurse Practitioner

## 2021-10-04 ENCOUNTER — Ambulatory Visit
Admission: RE | Admit: 2021-10-04 | Discharge: 2021-10-04 | Disposition: A | Discharge status: Home / Self Care | Payer: Medicare Other | Attending: Nurse Practitioner | Admitting: Nurse Practitioner

## 2021-10-04 ENCOUNTER — Ambulatory Visit
Admission: RE | Admit: 2021-10-04 | Discharge: 2021-10-04 | Disposition: A | Discharge status: Home / Self Care | Payer: Medicare Other | Source: Ambulatory Visit | Attending: Nurse Practitioner | Admitting: Nurse Practitioner

## 2021-10-04 ENCOUNTER — Encounter: Payer: Self-pay | Admitting: Nurse Practitioner

## 2021-10-04 VITALS — BP 114/78 | HR 106 | Temp 98.4°F | Wt 192.4 lb

## 2021-10-04 DIAGNOSIS — J208 Acute bronchitis due to other specified organisms: Secondary | ICD-10-CM

## 2021-10-04 DIAGNOSIS — R062 Wheezing: Secondary | ICD-10-CM

## 2021-10-04 MED ORDER — PREDNISONE 10 MG PO TABS: 10.0000 mg | ORAL_TABLET | Freq: Every day | ORAL | 0 refills | Status: DC

## 2021-10-04 MED ORDER — GUAIFENESIN-CODEINE 100-10 MG/5ML PO SOLN: 5.0000 mL | Freq: Two times a day (BID) | ORAL | 0 refills | Status: DC | PRN

## 2021-10-04 MED ORDER — AZITHROMYCIN 250 MG PO TABS: ORAL_TABLET | ORAL | 0 refills | Status: AC

## 2021-10-04 NOTE — Progress Notes (Signed)
BP 114/78    Pulse (!) 106    Temp 98.4 F (36.9 C) (Oral)    Wt 192 lb 6.4 oz (87.3 kg)    LMP  (LMP Unknown)    SpO2 98%    BMI 37.58 kg/m    Subjective:    Patient ID: Bethany Lara, female    DOB: 03-Dec-1950, 71 y.o.   MRN: [id number]  HPI: Bethany Lara is a 71 y.o. female  Chief Complaint  Patient presents with   URI    Pt states she has had a cough with grey colored phlegm for the past 3 weeks. States she has had a little bit of congestion as well.    UPPER RESPIRATORY TRACT INFECTION Worst symptom: sick for 3 weeks Fever: no Cough: yes Shortness of breath: no Wheezing: yes Chest pain: no Chest tightness: no Chest congestion: yes Nasal congestion: yes Runny nose: no Post nasal drip: yes Sneezing: no Sore throat: no Swollen glands: no Sinus pressure: no Headache: yes Face pain: no Toothache: no Ear pain: no bilateral Ear pressure: no bilateral Eyes red/itching:no Eye drainage/crusting: no  Vomiting: no Rash: no Fatigue: yes Sick contacts: no Strep contacts: no  Context: stable Recurrent sinusitis: no Relief with OTC cold/cough medications: yes  Treatments attempted: cold/sinus   Relevant past medical, surgical, family and social history reviewed and updated as indicated. Interim medical history since our last visit reviewed. Allergies and medications reviewed and updated.  Review of Systems  Constitutional:  Positive for fatigue. Negative for fever.  HENT:  Positive for congestion and postnasal drip. Negative for dental problem, ear pain, rhinorrhea, sinus pressure, sinus pain, sneezing and sore throat.   Respiratory:  Positive for cough and wheezing. Negative for shortness of breath.   Cardiovascular:  Negative for chest pain.  Gastrointestinal:  Negative for vomiting.  Skin:  Negative for rash.  Neurological:  Positive for headaches.   Per HPI unless specifically indicated above     Objective:    BP 114/78    Pulse (!) 106    Temp  98.4 F (36.9 C) (Oral)    Wt 192 lb 6.4 oz (87.3 kg)    LMP  (LMP Unknown)    SpO2 98%    BMI 37.58 kg/m   Wt Readings from Last 3 Encounters:  10/04/21 192 lb 6.4 oz (87.3 kg)  07/25/21 186 lb (84.4 kg)  06/21/21 184 lb (83.5 kg)    Physical Exam Vitals and nursing note reviewed.  Constitutional:      General: She is not in acute distress.    Appearance: Normal appearance. She is normal weight. She is not ill-appearing, toxic-appearing or diaphoretic.  HENT:     Head: Normocephalic.     Right Ear: External ear normal.     Left Ear: External ear normal.     Nose: Congestion present.     Right Sinus: No maxillary sinus tenderness or frontal sinus tenderness.     Left Sinus: No maxillary sinus tenderness or frontal sinus tenderness.     Mouth/Throat:     Mouth: Mucous membranes are moist.     Pharynx: Oropharynx is clear. No oropharyngeal exudate.  Eyes:     General:        Right eye: No discharge.        Left eye: No discharge.     Extraocular Movements: Extraocular movements intact.     Conjunctiva/sclera: Conjunctivae normal.     Pupils: Pupils are equal, round, and  reactive to light.  Cardiovascular:     Rate and Rhythm: Normal rate and regular rhythm.     Heart sounds: No murmur heard. Pulmonary:     Effort: Pulmonary effort is normal. No respiratory distress.     Breath sounds: Wheezing and rhonchi present. No rales.  Musculoskeletal:     Cervical back: Normal range of motion and neck supple.  Skin:    General: Skin is warm and dry.     Capillary Refill: Capillary refill takes less than 2 seconds.  Neurological:     General: No focal deficit present.     Mental Status: She is alert and oriented to person, place, and time. Mental status is at baseline.  Psychiatric:        Mood and Affect: Mood normal.        Behavior: Behavior normal.        Thought Content: Thought content normal.        Judgment: Judgment normal.    Results for orders placed or performed in  visit on 06/21/21  Comp Met (CMET)  Result Value Ref Range   Glucose 105 (H) 70 - 99 mg/dL   BUN 16 8 - 27 mg/dL   Creatinine, Ser 0.98 0.57 - 1.00 mg/dL   eGFR 61 >59 mL/min/1.73   BUN/Creatinine Ratio 16 12 - 28   Sodium 142 134 - 144 mmol/L   Potassium 4.3 3.5 - 5.2 mmol/L   Chloride 105 96 - 106 mmol/L   CO2 21 20 - 29 mmol/L   Calcium 9.5 8.7 - 10.3 mg/dL   Total Protein 6.9 6.0 - 8.5 g/dL   Albumin 4.8 (H) 3.7 - 4.7 g/dL   Globulin, Total 2.1 1.5 - 4.5 g/dL   Albumin/Globulin Ratio 2.3 (H) 1.2 - 2.2   Bilirubin Total 0.4 0.0 - 1.2 mg/dL   Alkaline Phosphatase 72 44 - 121 IU/L   AST 17 0 - 40 IU/L   ALT 16 0 - 32 IU/L  HgB A1c  Result Value Ref Range   Hgb A1c MFr Bld 5.7 (H) 4.8 - 5.6 %   Est. average glucose Bld gHb Est-mCnc 117 mg/dL  Lipid Profile  Result Value Ref Range   Cholesterol, Total 225 (H) 100 - 199 mg/dL   Triglycerides 141 0 - 149 mg/dL   HDL 58 >39 mg/dL   VLDL Cholesterol Cal 25 5 - 40 mg/dL   LDL Chol Calc (NIH) 142 (H) 0 - 99 mg/dL   Chol/HDL Ratio 3.9 0.0 - 4.4 ratio      Assessment & Plan:   Problem List Items Addressed This Visit   None Visit Diagnoses     Acute bronchitis due to other specified organisms    -  Primary   Concern for pneumonia. Will treat with azithromycin and prednisone. Will obtain chest xray to evaluate for penumonia. Will make recommendations based on imaging   Relevant Medications   azithromycin (ZITHROMAX) 250 MG tablet   Wheezing       Relevant Orders   DG Chest 2 View        Follow up plan: Return if symptoms worsen or fail to improve.

## 2021-10-05 NOTE — Progress Notes (Signed)
Hi [name].  Your chest xray is normal.  No need to add any medications at this time.  Complete the azithromycin and prednisone that you were given yesterday.

## 2021-12-19 ENCOUNTER — Other Ambulatory Visit: Payer: Self-pay | Admitting: Nurse Practitioner

## 2021-12-19 NOTE — Progress Notes (Signed)
? ?BP 127/81 (BP Location: Right Arm, Cuff Size: Normal)   Pulse 64   Temp 98.1 ?F (36.7 ?C) (Oral)   Ht 5' 0.6" (1.539 m)   Wt 192 lb 12.8 oz (87.5 kg)   LMP  (LMP Unknown)   SpO2 96%   BMI 36.91 kg/m?   ? ?Subjective:  ? ? Patient ID: Bethany Lara, female    DOB: October 19, 1950, 71 y.o.   MRN: [id number] ? ?HPI: ?Bethany Lara is a 71 y.o. female presenting on 12/20/2021 for comprehensive medical examination. Current medical complaints include:none ? ?She currently lives with: ?Menopausal Symptoms: no ? ?HYPERLIPIDEMIA ?Hyperlipidemia status: excellent compliance ?Satisfied with current treatment?  yes ?Side effects:  no ?Medication compliance: excellent compliance ?Past cholesterol meds: pravastatin (pravachol) ?Supplements: none ?Aspirin:  no ?The 10-year ASCVD risk score (Arnett DK, et al., 2019) is: 11.8% ?  Values used to calculate the score: ?    Age: 50 years ?    Sex: Female ?    Is Non-Hispanic African American: No ?    Diabetic: No ?    Tobacco smoker: No ?    Systolic Blood Pressure: 808 mmHg ?    Is BP treated: No ?    HDL Cholesterol: 58 mg/dL ?    Total Cholesterol: 225 mg/dL ?Chest pain:  no ?Coronary artery disease:  no ?Family history CAD:  no ?Family history early CAD:  no ? ?Depression Screen done today and results listed below:  ? ?  12/20/2021  ?  8:36 AM 10/04/2021  ?  1:39 PM 07/25/2021  ?  2:17 PM 05/18/2021  ?  8:39 AM 12/09/2020  ?  1:35 PM  ?Depression screen PHQ 2/9  ?Decreased Interest 1 0 0 0 0  ?Down, Depressed, Hopeless '2 1 1 '$ 0 0  ?PHQ - 2 Score '3 1 1 '$ 0 0  ?Altered sleeping '2 3 1    '$ ?Tired, decreased energy '1 1 1    '$ ?Change in appetite 0 0 1    ?Feeling bad or failure about yourself  0 0 0    ?Trouble concentrating 0 0 0    ?Moving slowly or fidgety/restless 0 0 0    ?Suicidal thoughts 0 0 1    ?PHQ-9 Score '6 5 5    '$ ?Difficult doing work/chores Not difficult at all Not difficult at all Not difficult at all    ? ? ?The patient does not have a history of falls. I did complete a  risk assessment for falls. A plan of care for falls was documented. ? ? ?Past Medical History:  ?Past Medical History:  ?Diagnosis Date  ? Allergy   ? Arrhythmia   ? Carpal tunnel syndrome   ? Hyperlipidemia   ? 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 ? ?Surgical History:  ?Past Surgical History:  ?Procedure Laterality Date  ? BUNIONECTOMY    ? CARPAL TUNNEL RELEASE  11/14/2015  ? CARPAL TUNNEL RELEASE Right 06/17/2019  ? CHOLECYSTECTOMY    ? EYELID LACERATION REPAIR    ? Eye Lid Surgery  ? PEG PLACEMENT N/A 10/30/2017  ? Procedure: PERCUTANEOUS ENDOSCOPIC GASTROSTOMY (PEG) PLACEMENT;  Surgeon: Lin Landsman, MD;  Location: [facility] ENDOSCOPY;  Service: Gastroenterology;  Laterality: N/A;  ? ? ?Medications:  ?Current Outpatient Medications on File Prior to Visit  ?Medication Sig  ? cetirizine (ZYRTEC) 10 MG tablet Take 1 tablet (10 mg total) by mouth daily.  ? clopidogrel (PLAVIX) 75 MG tablet TAKE 1 TABLET BY MOUTH  DAILY  ?  CO ENZYME Q-10 PO Take by mouth daily.  ? diclofenac Sodium (VOLTAREN) 1 % GEL Apply 2 g topically 4 (four) times daily.  ? fenofibrate 160 MG tablet Take 1 tablet (160 mg total) by mouth daily.  ? fluticasone (FLONASE) 50 MCG/ACT nasal spray Place 2 sprays into both nostrils 2 (two) times daily.  ? MAGNESIUM PO Take by mouth daily.  ? MILK THISTLE PO Take 3 tablets by mouth daily.  ? Multiple Vitamin (MULTIVITAMIN) tablet Place 1 tablet into feeding tube daily. (Patient taking differently: Take 1 tablet by mouth daily.)  ? NON FORMULARY CPAP @@ bedtime.  ? pravastatin (PRAVACHOL) 40 MG tablet Take 1 tablet (40 mg total) by mouth daily.  ? Vitamin D, Cholecalciferol, 1000 units TABS Give 2,000 Units by tube daily. (Patient taking differently: Take 2,000 Units by mouth daily.)  ? ?No current facility-administered medications on file prior to visit.  ? ? ?Allergies:  ?Allergies  ?Allergen Reactions  ? Crestor [Rosuvastatin Calcium] Other (See Comments)  ?  myalgias  ? Zocor [Simvastatin] Other (See  Comments)  ?  Fatigue, legs achey ?  ? ? ?Social History:  ?Social History  ? ?Socioeconomic History  ? Marital status: Married  ?  Spouse name: Not on file  ? Number of children: Not on file  ? Years of education: 12th grade  ? Highest education level: Not on file  ?Occupational History  ? Occupation: retired  ?Tobacco Use  ? Smoking status: Never  ? Smokeless tobacco: Never  ?Vaping Use  ? Vaping Use: Never used  ?Substance and Sexual Activity  ? Alcohol use: No  ?  Alcohol/week: 0.0 standard drinks  ? Drug use: No  ? Sexual activity: Not Currently  ?Other Topics Concern  ? Not on file  ?Social History Narrative  ? Not on file  ? ?Social Determinants of Health  ? ?Financial Resource Strain: Low Risk   ? Difficulty of Paying Living Expenses: Not hard at all  ?Food Insecurity: No Food Insecurity  ? Worried About Charity fundraiser in the Last Year: Never true  ? Ran Out of Food in the Last Year: Never true  ?Transportation Needs: No Transportation Needs  ? Lack of Transportation (Medical): No  ? Lack of Transportation (Non-Medical): No  ?Physical Activity: Inactive  ? Days of Exercise per Week: 0 days  ? Minutes of Exercise per Session: 0 min  ?Stress: Stress Concern Present  ? Feeling of Stress : To some extent  ?Social Connections: Socially Integrated  ? Frequency of Communication with Friends and Family: More than three times a week  ? Frequency of Social Gatherings with Friends and Family: Twice a week  ? Attends Religious Services: More than 4 times per year  ? Active Member of Clubs or Organizations: Yes  ? Attends Archivist Meetings: 1 to 4 times per year  ? Marital Status: Married  ?Intimate Partner Violence: Not At Risk  ? Fear of Current or Ex-Partner: No  ? Emotionally Abused: No  ? Physically Abused: No  ? Sexually Abused: No  ? ?Social History  ? ?Tobacco Use  ?Smoking Status Never  ?Smokeless Tobacco Never  ? ?Social History  ? ?Substance and Sexual Activity  ?Alcohol Use No  ?  Alcohol/week: 0.0 standard drinks  ? ? ?Family History:  ?Family History  ?Problem Relation Age of Onset  ? Diabetes Mother   ? Hypertension Mother   ? Dementia Mother   ? Heart murmur Mother   ?  Diabetes Father   ? Heart disease Father   ? Alzheimer's disease Father   ? Glaucoma Father   ? Colon cancer Father   ? Heart attack Father   ? Breast cancer Maternal Aunt   ? ? ?Past medical history, surgical history, medications, allergies, family history and social history reviewed with patient today and changes made to appropriate areas of the chart.  ? ?Review of Systems  ?Eyes:  Negative for blurred vision and double vision.  ?Respiratory:  Negative for shortness of breath.   ?Cardiovascular:  Negative for chest pain, palpitations and leg swelling.  ?Neurological:  Negative for dizziness and headaches.  ?All other ROS negative except what is listed above and in the HPI.  ? ?   ?Objective:  ?  ?BP 127/81 (BP Location: Right Arm, Cuff Size: Normal)   Pulse 64   Temp 98.1 ?F (36.7 ?C) (Oral)   Ht 5' 0.6" (1.539 m)   Wt 192 lb 12.8 oz (87.5 kg)   LMP  (LMP Unknown)   SpO2 96%   BMI 36.91 kg/m?   ?Wt Readings from Last 3 Encounters:  ?12/20/21 192 lb 12.8 oz (87.5 kg)  ?10/04/21 192 lb 6.4 oz (87.3 kg)  ?07/25/21 186 lb (84.4 kg)  ?  ?Physical Exam ?Vitals and nursing note reviewed.  ?Constitutional:   ?   General: She is awake. She is not in acute distress. ?   Appearance: Normal appearance. She is well-developed. She is obese. She is not ill-appearing.  ?HENT:  ?   Head: Normocephalic and atraumatic.  ?   Right Ear: Hearing, tympanic membrane, ear canal and external ear normal. No drainage.  ?   Left Ear: Hearing, tympanic membrane, ear canal and external ear normal. No drainage.  ?   Nose: Nose normal.  ?   Right Sinus: No maxillary sinus tenderness or frontal sinus tenderness.  ?   Left Sinus: No maxillary sinus tenderness or frontal sinus tenderness.  ?   Mouth/Throat:  ?   Mouth: Mucous membranes are moist.  ?    Pharynx: Oropharynx is clear. Uvula midline. No pharyngeal swelling, oropharyngeal exudate or posterior oropharyngeal erythema.  ?Eyes:  ?   General: Lids are normal.     ?   Right eye: No discharge.     ?   Left eye: No dis

## 2021-12-20 ENCOUNTER — Ambulatory Visit (INDEPENDENT_AMBULATORY_CARE_PROVIDER_SITE_OTHER): Payer: Medicare Other | Admitting: Nurse Practitioner

## 2021-12-20 ENCOUNTER — Other Ambulatory Visit: Payer: Self-pay

## 2021-12-20 ENCOUNTER — Other Ambulatory Visit: Payer: Self-pay | Admitting: Nurse Practitioner

## 2021-12-20 ENCOUNTER — Encounter: Payer: Self-pay | Admitting: Nurse Practitioner

## 2021-12-20 VITALS — BP 127/81 | HR 64 | Temp 98.1°F | Ht 60.6 in | Wt 192.8 lb

## 2021-12-20 DIAGNOSIS — R8290 Unspecified abnormal findings in urine: Secondary | ICD-10-CM

## 2021-12-20 DIAGNOSIS — Z0000 Encounter for general adult medical examination without abnormal findings: Secondary | ICD-10-CM

## 2021-12-20 DIAGNOSIS — G4733 Obstructive sleep apnea (adult) (pediatric): Secondary | ICD-10-CM | POA: Diagnosis not present

## 2021-12-20 DIAGNOSIS — E7849 Other hyperlipidemia: Secondary | ICD-10-CM

## 2021-12-20 DIAGNOSIS — R7301 Impaired fasting glucose: Secondary | ICD-10-CM

## 2021-12-20 DIAGNOSIS — Z1211 Encounter for screening for malignant neoplasm of colon: Secondary | ICD-10-CM

## 2021-12-20 DIAGNOSIS — Z1231 Encounter for screening mammogram for malignant neoplasm of breast: Secondary | ICD-10-CM

## 2021-12-20 NOTE — Telephone Encounter (Signed)
Requested medication (s) are due for refill today: yes ? ?Requested medication (s) are on the active medication list: yes ? ?Last refill:  06/21/21 #90 with 1 RF ? ?Future visit scheduled: yes, seen today, next visit 06/22/22 ? ?Notes to clinic:  labs are out of date, pt has orders for labs from today's visit, please assess. ? ? ?  ? ?Requested Prescriptions  ?Pending Prescriptions Disp Refills  ? fenofibrate 160 MG tablet [Pharmacy Med Name: Fenofibrate 160 MG Oral Tablet] 90 tablet 3  ?  Sig: TAKE 1 TABLET BY MOUTH  DAILY  ?  ? Cardiovascular:  Antilipid - Fibric Acid Derivatives Failed - 12/19/2021 11:54 PM  ?  ?  Failed - HGB in normal range and within 360 days  ?  Hemoglobin  ?Date Value Ref Range Status  ?05/16/2020 13.8 11.1 - 15.9 g/dL Final  ?  ?  ?  ?  Failed - HCT in normal range and within 360 days  ?  Hematocrit  ?Date Value Ref Range Status  ?05/16/2020 41.8 34.0 - 46.6 % Final  ?  ?  ?  ?  Failed - PLT in normal range and within 360 days  ?  Platelets  ?Date Value Ref Range Status  ?05/16/2020 274 150 - 450 x10E3/uL Final  ?  ?  ?  ?  Failed - WBC in normal range and within 360 days  ?  WBC  ?Date Value Ref Range Status  ?05/16/2020 8.2 3.4 - 10.8 x10E3/uL Final  ?10/31/2017 13.6 (H) 3.6 - 11.0 K/uL Final  ?  ?  ?  ?  Failed - Lipid Panel in normal range within the last 12 months  ?  Cholesterol, Total  ?Date Value Ref Range Status  ?06/21/2021 225 (H) 100 - 199 mg/dL Final  ? ?Cholesterol Piccolo, Elrama  ?Date Value Ref Range Status  ?01/21/2017 184 <200 mg/dL Final  ?  Comment:  ?                          Desirable                <200 ?                        Borderline High      200- 239 ?                        High                     >239 ?  ? ?LDL Chol Calc (NIH)  ?Date Value Ref Range Status  ?06/21/2021 142 (H) 0 - 99 mg/dL Final  ? ?HDL  ?Date Value Ref Range Status  ?06/21/2021 58 >39 mg/dL Final  ? ?Triglycerides  ?Date Value Ref Range Status  ?06/21/2021 141 0 - 149 mg/dL Final   ? ?Triglycerides Piccolo,Waived  ?Date Value Ref Range Status  ?01/21/2017 144 <150 mg/dL Final  ?  Comment:  ?                          Normal                   <150 ?                        Borderline  High     150 - 199 ?                        High                200 - 499 ?                        Very High                >499 ?  ? ?  ?  ?  Passed - ALT in normal range and within 360 days  ?  ALT  ?Date Value Ref Range Status  ?06/21/2021 16 0 - 32 IU/L Final  ?  ?  ?  ?  Passed - AST in normal range and within 360 days  ?  AST  ?Date Value Ref Range Status  ?06/21/2021 17 0 - 40 IU/L Final  ?  ?  ?  ?  Passed - Cr in normal range and within 360 days  ?  Creatinine, Ser  ?Date Value Ref Range Status  ?06/21/2021 0.98 0.57 - 1.00 mg/dL Final  ?  ?  ?  ?  Passed - eGFR is 30 or above and within 360 days  ?  GFR calc Af Amer  ?Date Value Ref Range Status  ?05/16/2020 68 >59 mL/min/1.73 Final  ?  Comment:  ?  **[facility] currently reports eGFR in compliance with the current** ?  recommendations of the [employer]. [facility] will ?  update reporting as new guidelines are published from the NKF-ASN ?  Task force. ?  ? ?GFR calc non Af Amer  ?Date Value Ref Range Status  ?05/16/2020 59 (L) >59 mL/min/1.73 Final  ? ?eGFR  ?Date Value Ref Range Status  ?06/21/2021 61 >59 mL/min/1.73 Final  ?  ?  ?  ?  Passed - Valid encounter within last 12 months  ?  Recent Outpatient Visits   ? ?      ? Today Annual physical exam  ? [place], NP  ? 2 months ago Acute bronchitis due to other specified organisms  ? [place], NP  ? 4 months ago Acute non-recurrent sinusitis, unspecified location  ? [place], Lauren A, NP  ? 5 months ago Lab test positive for detection of COVID-19 virus  ? [place], NP  ? 6 months ago IFG (impaired fasting glucose)  ? [facility] Jon Billings, NP  ? ?  ?   ?Future Appointments   ? ?        ? In 6 months Jon Billings, NP [facility], [facility]  ? ?  ? ?  ?  ?  ? ? ?

## 2021-12-20 NOTE — Assessment & Plan Note (Signed)
Chronic.  Controlled.  Continue with current medication regimen of Pravastatin '40mg'$  daily.  Labs ordered today.  Return to clinic in 6 months for reevaluation.  Call sooner if concerns arise.  ? ?

## 2021-12-20 NOTE — Assessment & Plan Note (Signed)
Labs ordered today.  Will make recommendations based on lab results. ?

## 2021-12-20 NOTE — Assessment & Plan Note (Signed)
Chronic.  Stable.  Uses CPAP regularly.  Continue with current regimen. ?

## 2021-12-21 ENCOUNTER — Other Ambulatory Visit: Payer: Self-pay | Admitting: Nurse Practitioner

## 2021-12-21 ENCOUNTER — Other Ambulatory Visit: Payer: Medicare Other

## 2021-12-21 LAB — MICROSCOPIC EXAMINATION
Bacteria, UA: NONE SEEN
RBC, Urine: NONE SEEN /hpf (ref 0–2)

## 2021-12-21 LAB — URINALYSIS, ROUTINE W REFLEX MICROSCOPIC
Bilirubin, UA: NEGATIVE
Glucose, UA: NEGATIVE
Ketones, UA: NEGATIVE
Nitrite, UA: NEGATIVE
Protein,UA: NEGATIVE
RBC, UA: NEGATIVE
Specific Gravity, UA: >1.03 — ABNORMAL HIGH (ref 1.005–1.030)
Urobilinogen, Ur: 0.2 mg/dL (ref 0.2–1.0)
pH, UA: 5.5 (ref 5.0–7.5)

## 2021-12-21 NOTE — Addendum Note (Signed)
Addended by: Jon Billings on: 12/21/2021 09:20 AM ? ? Modules accepted: Orders ? ?

## 2021-12-21 NOTE — Telephone Encounter (Signed)
Requested medication (s) are due for refill today: Yes ? ?Requested medication (s) are on the active medication list: Yes ? ?Last refill:  01/01/21 ? ?Future visit scheduled: Yes ? ?Notes to clinic:  Unable to refill per protocol due to failed labs, no updated results.  ? ? ? ? ?Requested Prescriptions  ?Pending Prescriptions Disp Refills  ? clopidogrel (PLAVIX) 75 MG tablet [Pharmacy Med Name: Clopidogrel Bisulfate 75 MG Oral Tablet] 90 tablet 3  ?  Sig: TAKE 1 TABLET BY MOUTH  DAILY  ?  ? Hematology: Antiplatelets - clopidogrel Failed - 12/20/2021  7:16 AM  ?  ?  Failed - HCT in normal range and within 180 days  ?  Hematocrit  ?Date Value Ref Range Status  ?05/16/2020 41.8 34.0 - 46.6 % Final  ?  ?  ?  ?  Failed - HGB in normal range and within 180 days  ?  Hemoglobin  ?Date Value Ref Range Status  ?05/16/2020 13.8 11.1 - 15.9 g/dL Final  ?  ?  ?  ?  Failed - PLT in normal range and within 180 days  ?  Platelets  ?Date Value Ref Range Status  ?05/16/2020 274 150 - 450 x10E3/uL Final  ?  ?  ?  ?  Passed - Cr in normal range and within 360 days  ?  Creatinine, Ser  ?Date Value Ref Range Status  ?06/21/2021 0.98 0.57 - 1.00 mg/dL Final  ?  ?  ?  ?  Passed - Valid encounter within last 6 months  ?  Recent Outpatient Visits   ? ?      ? Yesterday Annual physical exam  ? [place], NP  ? 2 months ago Acute bronchitis due to other specified organisms  ? [place], NP  ? 4 months ago Acute non-recurrent sinusitis, unspecified location  ? [place], Lauren A, NP  ? 5 months ago Lab test positive for detection of COVID-19 virus  ? [place], NP  ? 6 months ago IFG (impaired fasting glucose)  ? [facility] Jon Billings, NP  ? ?  ?  ?Future Appointments   ? ?        ? In 6 months Jon Billings, NP [facility], [facility]  ? ?  ? ?  ?  ?  ? ? ? ? ?

## 2021-12-21 NOTE — Telephone Encounter (Signed)
Requested Prescriptions  ?Pending Prescriptions Disp Refills  ?? pravastatin (PRAVACHOL) 40 MG tablet [Pharmacy Med Name: Pravastatin Sodium 40 MG Oral Tablet] 90 tablet 3  ?  Sig: TAKE 1 TABLET BY MOUTH  DAILY  ?  ? Cardiovascular:  Antilipid - Statins Failed - 12/21/2021  6:05 AM  ?  ?  Failed - Lipid Panel in normal range within the last 12 months  ?  Cholesterol, Total  ?Date Value Ref Range Status  ?06/21/2021 225 (H) 100 - 199 mg/dL Final  ? ?Cholesterol Piccolo, Cornelius  ?Date Value Ref Range Status  ?01/21/2017 184 <200 mg/dL Final  ?  Comment:  ?                          Desirable                <200 ?                        Borderline High      200- 239 ?                        High                     >239 ?  ? ?LDL Chol Calc (NIH)  ?Date Value Ref Range Status  ?06/21/2021 142 (H) 0 - 99 mg/dL Final  ? ?HDL  ?Date Value Ref Range Status  ?06/21/2021 58 >39 mg/dL Final  ? ?Triglycerides  ?Date Value Ref Range Status  ?06/21/2021 141 0 - 149 mg/dL Final  ? ?Triglycerides Piccolo,Waived  ?Date Value Ref Range Status  ?01/21/2017 144 <150 mg/dL Final  ?  Comment:  ?                          Normal                   <150 ?                        Borderline High     150 - 199 ?                        High                200 - 499 ?                        Very High                >499 ?  ? ?  ?  ?  Passed - Patient is not pregnant  ?  ?  Passed - Valid encounter within last 12 months  ?  Recent Outpatient Visits   ?      ? Yesterday Annual physical exam  ? [place], NP  ? 2 months ago Acute bronchitis due to other specified organisms  ? [place], NP  ? 4 months ago Acute non-recurrent sinusitis, unspecified location  ? [place], Lauren A, NP  ? 5 months ago Lab test positive for detection of COVID-19 virus  ? [place], NP  ? 6 months ago IFG (impaired fasting glucose)  ? [facility] Jon Billings, NP  ?  ?  ?Future Appointments   ?        ? In 6 months Jon Billings, NP [facility], [facility]  ?  ? ?  ?  ?  ? ?

## 2021-12-22 LAB — CBC WITH DIFFERENTIAL/PLATELET
Basophils Absolute: 0.1 10*3/uL (ref 0.0–0.2)
Basos: 2 %
EOS (ABSOLUTE): 0.3 10*3/uL (ref 0.0–0.4)
Eos: 4 %
Hematocrit: 41.9 % (ref 34.0–46.6)
Hemoglobin: 14.2 g/dL (ref 11.1–15.9)
Immature Grans (Abs): 0.1 10*3/uL (ref 0.0–0.1)
Immature Granulocytes: 1 %
Lymphocytes Absolute: 1.7 10*3/uL (ref 0.7–3.1)
Lymphs: 26 %
MCH: 29.8 pg (ref 26.6–33.0)
MCHC: 33.9 g/dL (ref 31.5–35.7)
MCV: 88 fL (ref 79–97)
Monocytes Absolute: 0.6 10*3/uL (ref 0.1–0.9)
Monocytes: 9 %
Neutrophils Absolute: 3.8 10*3/uL (ref 1.4–7.0)
Neutrophils: 58 %
Platelets: 260 10*3/uL (ref 150–450)
RBC: 4.77 x10E6/uL (ref 3.77–5.28)
RDW: 12.6 % (ref 11.7–15.4)
WBC: 6.4 10*3/uL (ref 3.4–10.8)

## 2021-12-22 LAB — COMPREHENSIVE METABOLIC PANEL
ALT: 23 IU/L (ref 0–32)
AST: 26 IU/L (ref 0–40)
Albumin/Globulin Ratio: 2.4 — ABNORMAL HIGH (ref 1.2–2.2)
Albumin: 4.7 g/dL (ref 3.7–4.7)
Alkaline Phosphatase: 78 IU/L (ref 44–121)
BUN/Creatinine Ratio: 16 (ref 12–28)
BUN: 16 mg/dL (ref 8–27)
Bilirubin Total: 0.6 mg/dL (ref 0.0–1.2)
CO2: 22 mmol/L (ref 20–29)
Calcium: 9.8 mg/dL (ref 8.7–10.3)
Chloride: 105 mmol/L (ref 96–106)
Creatinine, Ser: 0.99 mg/dL (ref 0.57–1.00)
Globulin, Total: 2 g/dL (ref 1.5–4.5)
Glucose: 113 mg/dL — ABNORMAL HIGH (ref 70–99)
Potassium: 4.1 mmol/L (ref 3.5–5.2)
Sodium: 141 mmol/L (ref 134–144)
Total Protein: 6.7 g/dL (ref 6.0–8.5)
eGFR: 61 mL/min/{1.73_m2} (ref >59)

## 2021-12-22 LAB — LIPID PANEL
Chol/HDL Ratio: 3.8 ratio (ref 0.0–4.4)
Cholesterol, Total: 222 mg/dL — ABNORMAL HIGH (ref 100–199)
HDL: 59 mg/dL (ref >39)
LDL Chol Calc (NIH): 133 mg/dL — ABNORMAL HIGH (ref 0–99)
Triglycerides: 170 mg/dL — ABNORMAL HIGH (ref 0–149)
VLDL Cholesterol Cal: 30 mg/dL (ref 5–40)

## 2021-12-22 LAB — HEMOGLOBIN A1C
Est. average glucose Bld gHb Est-mCnc: 123 mg/dL
Hgb A1c MFr Bld: 5.9 % — ABNORMAL HIGH (ref 4.8–5.6)

## 2021-12-22 LAB — TSH: TSH: 2.69 u[IU]/mL (ref 0.450–4.500)

## 2021-12-22 NOTE — Progress Notes (Signed)
Hi Bethany Lara.  It was nice to see you yesterday.  Your lab work looks good.  A1c remains well controlled at this time.  Cholesterol is also similar to prior.  Recommend a low fat diet and exercise. Otherwise, lab work is stable.  Continue with your current medication regimen.  Follow up as discussed.  Please let me know if you have any questions.  ?

## 2022-01-11 LAB — COLOGUARD: COLOGUARD: NEGATIVE

## 2022-01-11 NOTE — Progress Notes (Signed)
Hi Bethany Lara.  Your cologuard test was negative.  We will repeat it in 3 years.

## 2022-01-31 ENCOUNTER — Ambulatory Visit
Admission: RE | Admit: 2022-01-31 | Discharge: 2022-01-31 | Disposition: A | Discharge status: Home / Self Care | Payer: Medicare Other | Source: Ambulatory Visit | Attending: Nurse Practitioner | Admitting: Nurse Practitioner

## 2022-01-31 DIAGNOSIS — Z1231 Encounter for screening mammogram for malignant neoplasm of breast: Secondary | ICD-10-CM | POA: Diagnosis present

## 2022-02-01 NOTE — Progress Notes (Signed)
Please let patient know her Mammogram did not show any evidence of a malignancy.  The recommendation is to repeat the Mammogram in 1 year.  

## 2022-06-04 ENCOUNTER — Other Ambulatory Visit: Payer: Self-pay | Admitting: Nurse Practitioner

## 2022-06-06 NOTE — Telephone Encounter (Signed)
Requested Prescriptions  Pending Prescriptions Disp Refills  . fenofibrate 160 MG tablet [Pharmacy Med Name: Fenofibrate 160 MG Oral Tablet] 90 tablet 3    Sig: TAKE 1 TABLET BY MOUTH DAILY     Cardiovascular:  Antilipid - Fibric Acid Derivatives Failed - 06/04/2022 10:14 PM      Failed - Lipid Panel in normal range within the last 12 months    Cholesterol, Total  Date Value Ref Range Status  12/21/2021 222 (H) 100 - 199 mg/dL Final   Cholesterol Piccolo, Waived  Date Value Ref Range Status  01/21/2017 184 <200 mg/dL Final    Comment:                            Desirable                <200                         Borderline High      200- 239                         High                     >239    LDL Chol Calc (NIH)  Date Value Ref Range Status  12/21/2021 133 (H) 0 - 99 mg/dL Final   HDL  Date Value Ref Range Status  12/21/2021 59 >39 mg/dL Final   Triglycerides  Date Value Ref Range Status  12/21/2021 170 (H) 0 - 149 mg/dL Final   Triglycerides Piccolo,Waived  Date Value Ref Range Status  01/21/2017 144 <150 mg/dL Final    Comment:                            Normal                   <150                         Borderline High     150 - 199                         High                200 - 499                         Very High                >499          Passed - ALT in normal range and within 360 days    ALT  Date Value Ref Range Status  12/21/2021 23 0 - 32 IU/L Final         Passed - AST in normal range and within 360 days    AST  Date Value Ref Range Status  12/21/2021 26 0 - 40 IU/L Final         Passed - Cr in normal range and within 360 days    Creatinine, Ser  Date Value Ref Range Status  12/21/2021 0.99 0.57 - 1.00 mg/dL Final         Passed - HGB in normal range and within 360 days  Hemoglobin  Date Value Ref Range Status  12/21/2021 14.2 11.1 - 15.9 g/dL Final         Passed - HCT in normal range and within 360 days    Hematocrit   Date Value Ref Range Status  12/21/2021 41.9 34.0 - 46.6 % Final         Passed - PLT in normal range and within 360 days    Platelets  Date Value Ref Range Status  12/21/2021 260 150 - 450 x10E3/uL Final         Passed - WBC in normal range and within 360 days    WBC  Date Value Ref Range Status  12/21/2021 6.4 3.4 - 10.8 x10E3/uL Final  10/31/2017 13.6 (H) 3.6 - 11.0 K/uL Final         Passed - eGFR is 30 or above and within 360 days    GFR calc Af Amer  Date Value Ref Range Status  05/16/2020 68 >59 mL/min/1.73 Final    Comment:    **[facility] currently reports eGFR in compliance with the current**   recommendations of the [employer]. [facility] will   update reporting as new guidelines are published from the NKF-ASN   Task force.    GFR calc non Af Amer  Date Value Ref Range Status  05/16/2020 59 (L) >59 mL/min/1.73 Final   eGFR  Date Value Ref Range Status  12/21/2021 61 >59 mL/min/1.73 Final         Passed - Valid encounter within last 12 months    Recent Outpatient Visits          5 months ago Annual physical exam   [place], NP   8 months ago Acute bronchitis due to other specified organisms   [place], NP   10 months ago Acute non-recurrent sinusitis, unspecified location   [facility], Lauren A, NP   10 months ago Lab test positive for detection of COVID-19 virus   [place], NP   11 months ago IFG (impaired fasting glucose)   [facility] Jon Billings, NP      Future Appointments            In 2 weeks Jon Billings, NP [facility], [place]           . clopidogrel (PLAVIX) 75 MG tablet [Pharmacy Med Name: Clopidogrel Bisulfate 75 MG Oral Tablet] 90 tablet 3    Sig: TAKE 1 TABLET BY MOUTH DAILY     Hematology: Antiplatelets - clopidogrel Passed - 06/04/2022 10:14 PM      Passed -  HCT in normal range and within 180 days    Hematocrit  Date Value Ref Range Status  12/21/2021 41.9 34.0 - 46.6 % Final         Passed - HGB in normal range and within 180 days    Hemoglobin  Date Value Ref Range Status  12/21/2021 14.2 11.1 - 15.9 g/dL Final         Passed - PLT in normal range and within 180 days    Platelets  Date Value Ref Range Status  12/21/2021 260 150 - 450 x10E3/uL Final         Passed - Cr in normal range and within 360 days    Creatinine, Ser  Date Value Ref Range Status  12/21/2021 0.99 0.57 - 1.00 mg/dL Final  Passed - Valid encounter within last 6 months    Recent Outpatient Visits          5 months ago Annual physical exam   [facility] Jon Billings, NP   8 months ago Acute bronchitis due to other specified organisms   [place], NP   10 months ago Acute non-recurrent sinusitis, unspecified location   [facility], Lauren A, NP   10 months ago Lab test positive for detection of COVID-19 virus   [facility] Jon Billings, NP   11 months ago IFG (impaired fasting glucose)   [facility] Jon Billings, NP      Future Appointments            In 2 weeks Jon Billings, NP [facility], [place]

## 2022-06-22 ENCOUNTER — Ambulatory Visit: Payer: Medicare Other | Admitting: Nurse Practitioner

## 2022-07-18 NOTE — Progress Notes (Deleted)
LMP  (LMP Unknown)    Subjective:    Patient ID: Bethany Lara, female    DOB: [birth year], 71 y.o.   MRN: [id number]  HPI: Bethany Lara is a 71 y.o. female  No chief complaint on file.  HYPERLIPIDEMIA Hyperlipidemia status: excellent compliance Satisfied with current treatment?  no Side effects:  no Medication compliance: excellent compliance Past cholesterol meds: pravastatin (pravachol) and fenofibrate Supplements: none Aspirin:   plavix The 10-year ASCVD risk score (Arnett DK, et al., 2019) is: 13%   Values used to calculate the score:     Age: 89 years     Sex: Female     Is Non-Hispanic African American: No     Diabetic: No     Tobacco smoker: No     Systolic Blood Pressure: 262 mmHg     Is BP treated: No     HDL Cholesterol: 59 mg/dL     Total Cholesterol: 222 mg/dL Chest pain:  no Coronary artery disease:  no Family history CAD:  no Family history early CAD:  no  Denies HA, CP, SOB, dizziness, palpitations, visual changes, and lower extremity swelling.   Relevant past medical, surgical, family and social history reviewed and updated as indicated. Interim medical history since our last visit reviewed. Allergies and medications reviewed and updated.  Review of Systems  Eyes:  Negative for visual disturbance.  Respiratory:  Negative for cough, chest tightness and shortness of breath.   Cardiovascular:  Negative for chest pain, palpitations and leg swelling.  Neurological:  Negative for dizziness and headaches.   Per HPI unless specifically indicated above     Objective:    LMP  (LMP Unknown)   Wt Readings from Last 3 Encounters:  12/20/21 192 lb 12.8 oz (87.5 kg)  10/04/21 192 lb 6.4 oz (87.3 kg)  07/25/21 186 lb (84.4 kg)    Physical Exam Vitals and nursing note reviewed.  Constitutional:      General: She is not in acute distress.    Appearance: Normal appearance. She is normal weight. She is not ill-appearing, toxic-appearing or  diaphoretic.  HENT:     Head: Normocephalic.     Right Ear: External ear normal.     Left Ear: External ear normal.     Nose: Nose normal.     Mouth/Throat:     Mouth: Mucous membranes are moist.     Pharynx: Oropharynx is clear.  Eyes:     General:        Right eye: No discharge.        Left eye: No discharge.     Extraocular Movements: Extraocular movements intact.     Conjunctiva/sclera: Conjunctivae normal.     Pupils: Pupils are equal, round, and reactive to light.  Cardiovascular:     Rate and Rhythm: Normal rate and regular rhythm.     Heart sounds: No murmur heard. Pulmonary:     Effort: Pulmonary effort is normal. No respiratory distress.     Breath sounds: Normal breath sounds. No wheezing or rales.  Musculoskeletal:     Cervical back: Normal range of motion and neck supple.  Skin:    General: Skin is warm and dry.     Capillary Refill: Capillary refill takes less than 2 seconds.  Neurological:     General: No focal deficit present.     Mental Status: She is alert and oriented to person, place, and time. Mental status is at baseline.  Psychiatric:  Mood and Affect: Mood normal.        Behavior: Behavior normal.        Thought Content: Thought content normal.        Judgment: Judgment normal.    Results for orders placed or performed in visit on 12/20/21  Microscopic Examination   Urine  Result Value Ref Range   WBC, UA 0-5 0 - 5 /hpf   RBC, Urine None seen 0 - 2 /hpf   Epithelial Cells (non renal) 0-10 0 - 10 /hpf   Bacteria, UA None seen None seen/Few  HgB A1c  Result Value Ref Range   Hgb A1c MFr Bld 5.9 (H) 4.8 - 5.6 %   Est. average glucose Bld gHb Est-mCnc 123 mg/dL  CBC with Differential/Platelet  Result Value Ref Range   WBC 6.4 3.4 - 10.8 x10E3/uL   RBC 4.77 3.77 - 5.28 x10E6/uL   Hemoglobin 14.2 11.1 - 15.9 g/dL   Hematocrit 41.9 34.0 - 46.6 %   MCV 88 79 - 97 fL   MCH 29.8 26.6 - 33.0 pg   MCHC 33.9 31.5 - 35.7 g/dL   RDW 12.6 11.7 -  15.4 %   Platelets 260 150 - 450 x10E3/uL   Neutrophils 58 Not Estab. %   Lymphs 26 Not Estab. %   Monocytes 9 Not Estab. %   Eos 4 Not Estab. %   Basos 2 Not Estab. %   Neutrophils Absolute 3.8 1.4 - 7.0 x10E3/uL   Lymphocytes Absolute 1.7 0.7 - 3.1 x10E3/uL   Monocytes Absolute 0.6 0.1 - 0.9 x10E3/uL   EOS (ABSOLUTE) 0.3 0.0 - 0.4 x10E3/uL   Basophils Absolute 0.1 0.0 - 0.2 x10E3/uL   Immature Granulocytes 1 Not Estab. %   Immature Grans (Abs) 0.1 0.0 - 0.1 x10E3/uL  Comprehensive metabolic panel  Result Value Ref Range   Glucose 113 (H) 70 - 99 mg/dL   BUN 16 8 - 27 mg/dL   Creatinine, Ser 0.99 0.57 - 1.00 mg/dL   eGFR 61 >59 mL/min/1.73   BUN/Creatinine Ratio 16 12 - 28   Sodium 141 134 - 144 mmol/L   Potassium 4.1 3.5 - 5.2 mmol/L   Chloride 105 96 - 106 mmol/L   CO2 22 20 - 29 mmol/L   Calcium 9.8 8.7 - 10.3 mg/dL   Total Protein 6.7 6.0 - 8.5 g/dL   Albumin 4.7 3.7 - 4.7 g/dL   Globulin, Total 2.0 1.5 - 4.5 g/dL   Albumin/Globulin Ratio 2.4 (H) 1.2 - 2.2   Bilirubin Total 0.6 0.0 - 1.2 mg/dL   Alkaline Phosphatase 78 44 - 121 IU/L   AST 26 0 - 40 IU/L   ALT 23 0 - 32 IU/L  Lipid panel  Result Value Ref Range   Cholesterol, Total 222 (H) 100 - 199 mg/dL   Triglycerides 170 (H) 0 - 149 mg/dL   HDL 59 >39 mg/dL   VLDL Cholesterol Cal 30 5 - 40 mg/dL   LDL Chol Calc (NIH) 133 (H) 0 - 99 mg/dL   Chol/HDL Ratio 3.8 0.0 - 4.4 ratio  TSH  Result Value Ref Range   TSH 2.690 0.450 - 4.500 uIU/mL  Urinalysis, Routine w reflex microscopic  Result Value Ref Range   Specific Gravity, UA >1.030 (H) 1.005 - 1.030   pH, UA 5.5 5.0 - 7.5   Color, UA Yellow Yellow   Appearance Ur Clear Clear   Leukocytes,UA 1+ (A) Negative   Protein,UA Negative Negative/Trace  Glucose, UA Negative Negative   Ketones, UA Negative Negative   RBC, UA Negative Negative   Bilirubin, UA Negative Negative   Urobilinogen, Ur 0.2 0.2 - 1.0 mg/dL   Nitrite, UA Negative Negative   Microscopic  Examination See below:   Cologuard  Result Value Ref Range   COLOGUARD Negative Negative      Assessment & Plan:   Problem List Items Addressed This Visit      Endocrine   IFG (impaired fasting glucose)     Other   Hyperlipidemia - Primary     Follow up plan: No follow-ups on file.

## 2022-07-19 ENCOUNTER — Ambulatory Visit: Payer: Medicare Other | Admitting: Nurse Practitioner

## 2022-07-19 DIAGNOSIS — R7301 Impaired fasting glucose: Secondary | ICD-10-CM

## 2022-07-19 DIAGNOSIS — E7849 Other hyperlipidemia: Secondary | ICD-10-CM

## 2022-07-20 NOTE — Progress Notes (Unsigned)
LMP  (LMP Unknown)    Subjective:    Patient ID: Bethany Lara, female    DOB: [birth year], 71 y.o.   MRN: [id number]  HPI: Bethany Lara is a 71 y.o. female  No chief complaint on file.  HYPERLIPIDEMIA Hyperlipidemia status: excellent compliance Satisfied with current treatment?  no Side effects:  no Medication compliance: excellent compliance Past cholesterol meds: pravastatin (pravachol) and fenofibrate Supplements: none Aspirin:   plavix The 10-year ASCVD risk score (Arnett DK, et al., 2019) is: 13%   Values used to calculate the score:     Age: 89 years     Sex: Female     Is Non-Hispanic African American: No     Diabetic: No     Tobacco smoker: No     Systolic Blood Pressure: 262 mmHg     Is BP treated: No     HDL Cholesterol: 59 mg/dL     Total Cholesterol: 222 mg/dL Chest pain:  no Coronary artery disease:  no Family history CAD:  no Family history early CAD:  no  Denies HA, CP, SOB, dizziness, palpitations, visual changes, and lower extremity swelling.   Relevant past medical, surgical, family and social history reviewed and updated as indicated. Interim medical history since our last visit reviewed. Allergies and medications reviewed and updated.  Review of Systems  Eyes:  Negative for visual disturbance.  Respiratory:  Negative for cough, chest tightness and shortness of breath.   Cardiovascular:  Negative for chest pain, palpitations and leg swelling.  Neurological:  Negative for dizziness and headaches.   Per HPI unless specifically indicated above     Objective:    LMP  (LMP Unknown)   Wt Readings from Last 3 Encounters:  12/20/21 192 lb 12.8 oz (87.5 kg)  10/04/21 192 lb 6.4 oz (87.3 kg)  07/25/21 186 lb (84.4 kg)    Physical Exam Vitals and nursing note reviewed.  Constitutional:      General: She is not in acute distress.    Appearance: Normal appearance. She is normal weight. She is not ill-appearing, toxic-appearing or  diaphoretic.  HENT:     Head: Normocephalic.     Right Ear: External ear normal.     Left Ear: External ear normal.     Nose: Nose normal.     Mouth/Throat:     Mouth: Mucous membranes are moist.     Pharynx: Oropharynx is clear.  Eyes:     General:        Right eye: No discharge.        Left eye: No discharge.     Extraocular Movements: Extraocular movements intact.     Conjunctiva/sclera: Conjunctivae normal.     Pupils: Pupils are equal, round, and reactive to light.  Cardiovascular:     Rate and Rhythm: Normal rate and regular rhythm.     Heart sounds: No murmur heard. Pulmonary:     Effort: Pulmonary effort is normal. No respiratory distress.     Breath sounds: Normal breath sounds. No wheezing or rales.  Musculoskeletal:     Cervical back: Normal range of motion and neck supple.  Skin:    General: Skin is warm and dry.     Capillary Refill: Capillary refill takes less than 2 seconds.  Neurological:     General: No focal deficit present.     Mental Status: She is alert and oriented to person, place, and time. Mental status is at baseline.  Psychiatric:  Mood and Affect: Mood normal.        Behavior: Behavior normal.        Thought Content: Thought content normal.        Judgment: Judgment normal.    Results for orders placed or performed in visit on 12/20/21  Microscopic Examination   Urine  Result Value Ref Range   WBC, UA 0-5 0 - 5 /hpf   RBC, Urine None seen 0 - 2 /hpf   Epithelial Cells (non renal) 0-10 0 - 10 /hpf   Bacteria, UA None seen None seen/Few  HgB A1c  Result Value Ref Range   Hgb A1c MFr Bld 5.9 (H) 4.8 - 5.6 %   Est. average glucose Bld gHb Est-mCnc 123 mg/dL  CBC with Differential/Platelet  Result Value Ref Range   WBC 6.4 3.4 - 10.8 x10E3/uL   RBC 4.77 3.77 - 5.28 x10E6/uL   Hemoglobin 14.2 11.1 - 15.9 g/dL   Hematocrit 41.9 34.0 - 46.6 %   MCV 88 79 - 97 fL   MCH 29.8 26.6 - 33.0 pg   MCHC 33.9 31.5 - 35.7 g/dL   RDW 12.6 11.7 -  15.4 %   Platelets 260 150 - 450 x10E3/uL   Neutrophils 58 Not Estab. %   Lymphs 26 Not Estab. %   Monocytes 9 Not Estab. %   Eos 4 Not Estab. %   Basos 2 Not Estab. %   Neutrophils Absolute 3.8 1.4 - 7.0 x10E3/uL   Lymphocytes Absolute 1.7 0.7 - 3.1 x10E3/uL   Monocytes Absolute 0.6 0.1 - 0.9 x10E3/uL   EOS (ABSOLUTE) 0.3 0.0 - 0.4 x10E3/uL   Basophils Absolute 0.1 0.0 - 0.2 x10E3/uL   Immature Granulocytes 1 Not Estab. %   Immature Grans (Abs) 0.1 0.0 - 0.1 x10E3/uL  Comprehensive metabolic panel  Result Value Ref Range   Glucose 113 (H) 70 - 99 mg/dL   BUN 16 8 - 27 mg/dL   Creatinine, Ser 0.99 0.57 - 1.00 mg/dL   eGFR 61 >59 mL/min/1.73   BUN/Creatinine Ratio 16 12 - 28   Sodium 141 134 - 144 mmol/L   Potassium 4.1 3.5 - 5.2 mmol/L   Chloride 105 96 - 106 mmol/L   CO2 22 20 - 29 mmol/L   Calcium 9.8 8.7 - 10.3 mg/dL   Total Protein 6.7 6.0 - 8.5 g/dL   Albumin 4.7 3.7 - 4.7 g/dL   Globulin, Total 2.0 1.5 - 4.5 g/dL   Albumin/Globulin Ratio 2.4 (H) 1.2 - 2.2   Bilirubin Total 0.6 0.0 - 1.2 mg/dL   Alkaline Phosphatase 78 44 - 121 IU/L   AST 26 0 - 40 IU/L   ALT 23 0 - 32 IU/L  Lipid panel  Result Value Ref Range   Cholesterol, Total 222 (H) 100 - 199 mg/dL   Triglycerides 170 (H) 0 - 149 mg/dL   HDL 59 >39 mg/dL   VLDL Cholesterol Cal 30 5 - 40 mg/dL   LDL Chol Calc (NIH) 133 (H) 0 - 99 mg/dL   Chol/HDL Ratio 3.8 0.0 - 4.4 ratio  TSH  Result Value Ref Range   TSH 2.690 0.450 - 4.500 uIU/mL  Urinalysis, Routine w reflex microscopic  Result Value Ref Range   Specific Gravity, UA >1.030 (H) 1.005 - 1.030   pH, UA 5.5 5.0 - 7.5   Color, UA Yellow Yellow   Appearance Ur Clear Clear   Leukocytes,UA 1+ (A) Negative   Protein,UA Negative Negative/Trace  Glucose, UA Negative Negative   Ketones, UA Negative Negative   RBC, UA Negative Negative   Bilirubin, UA Negative Negative   Urobilinogen, Ur 0.2 0.2 - 1.0 mg/dL   Nitrite, UA Negative Negative   Microscopic  Examination See below:   Cologuard  Result Value Ref Range   COLOGUARD Negative Negative      Assessment & Plan:   Problem List Items Addressed This Visit      Endocrine   IFG (impaired fasting glucose)     Other   Hyperlipidemia  Other Visit Diagnoses    Annual physical exam    -  Primary       Follow up plan: No follow-ups on file.

## 2022-07-20 NOTE — Progress Notes (Deleted)
LMP  (LMP Unknown)    Subjective:    Patient ID: Bethany Lara, female    DOB: [birth year], 71 y.o.   MRN: [id number]  HPI: Bethany Lara is a 71 y.o. female presenting on 07/23/2022 for comprehensive medical examination. Current medical complaints include:none  She currently lives with: Menopausal Symptoms: no  HYPERLIPIDEMIA Hyperlipidemia status: excellent compliance Satisfied with current treatment?  yes Side effects:  no Medication compliance: excellent compliance Past cholesterol meds: pravastatin (pravachol) Supplements: none Aspirin:  no The 10-year ASCVD risk score (Arnett DK, et al., 2019) is: 13%   Values used to calculate the score:     Age: 73 years     Sex: Female     Is Non-Hispanic African American: No     Diabetic: No     Tobacco smoker: No     Systolic Blood Pressure: 193 mmHg     Is BP treated: No     HDL Cholesterol: 59 mg/dL     Total Cholesterol: 222 mg/dL Chest pain:  no Coronary artery disease:  no Family history CAD:  no Family history early CAD:  no  Depression Screen done today and results listed below:     12/20/2021    8:36 AM 10/04/2021    1:39 PM 07/25/2021    2:17 PM 05/18/2021    8:39 AM 12/09/2020    1:35 PM  Depression screen PHQ 2/9  Decreased Interest 1 0 0 0 0  Down, Depressed, Hopeless _0 0 0  PHQ - 2 Score _1 0 0  Altered sleeping _2 Tired, decreased energy _3 Change in appetite 0 0 1    Feeling bad or failure about yourself  0 0 0    Trouble concentrating 0 0 0    Moving slowly or fidgety/restless 0 0 0    Suicidal thoughts 0 0 1    PHQ-9 Score _4 Difficult doing work/chores Not difficult at all Not difficult at all Not difficult at all      The patient does not have a history of falls. I did complete a risk assessment for falls. A plan of care for falls was documented.   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   clopidogrel (PLAVIX) 75 MG tablet TAKE 1 TABLET BY MOUTH DAILY   CO ENZYME Q-10 PO Take by mouth daily.   diclofenac Sodium (VOLTAREN) 1 % GEL Apply 2 g topically 4 (four) times daily.   fenofibrate 160 MG tablet TAKE 1 TABLET BY MOUTH DAILY   fluticasone (FLONASE) 50 MCG/ACT nasal spray Place 2 sprays into  both nostrils 2 (two) times daily.   MAGNESIUM PO Take by mouth daily.   MILK THISTLE PO Take 3 tablets by mouth daily.   Multiple Vitamin (MULTIVITAMIN) tablet Place 1 tablet into feeding tube daily. (Patient taking differently: Take 1 tablet by mouth daily.)   NON FORMULARY CPAP @@ bedtime.   pravastatin (PRAVACHOL) 40 MG tablet TAKE 1 TABLET BY MOUTH  DAILY   Vitamin D, Cholecalciferol, 1000 units TABS Give 2,000 Units by tube daily. (Patient taking differently: Take 2,000 Units by mouth daily.)   No current facility-administered medications on file prior to visit.    Allergies:  Allergies  Allergen Reactions   Crestor [Rosuvastatin Calcium] Other (See Comments)    myalgias   Zocor [Simvastatin] Other (See Comments)    Fatigue, legs achey     Social History:  Social History   Socioeconomic History   Marital status: Married    Spouse name: Not on file   Number of children: Not on file   Years of education: 12th  grade   Highest education level: Not on file  Occupational History   Occupation: retired  Tobacco Use   Smoking status: Never   Smokeless tobacco: Never  Vaping Use   Vaping Use: Never used  Substance and Sexual Activity   Alcohol use: No    Alcohol/week: 0.0 standard drinks of alcohol   Drug use: No   Sexual activity: Not Currently  Other Topics Concern   Not on file  Social History Narrative   Not on file   Social Determinants of Health   Financial Resource Strain: Low Risk  (08/09/2021)   Overall Financial Resource Strain (CARDIA)    Difficulty of Paying Living Expenses: Not hard at all  Food Insecurity: No Food Insecurity (08/09/2021)   Hunger Vital Sign    Worried About Running Out of Food in the Last Year: Never true    [place] in the Last Year: Never true  Transportation Needs: No Transportation Needs (08/09/2021)   PRAPARE - Hydrologist (Medical): No    Lack of Transportation (Non-Medical): No  Physical Activity: Inactive (08/09/2021)   Exercise Vital Sign    Days of Exercise per Week: 0 days    Minutes of Exercise per Session: 0 min  Stress: Stress Concern Present (08/09/2021)   [place]    Feeling of Stress : To some extent  Social Connections: Socially Integrated (08/09/2021)   Social Connection and Isolation Panel [NHANES]    Frequency of Communication with Friends and Family: More than three times a week    Frequency of Social Gatherings with Friends and Family: Twice a week    Attends Religious Services: More than 4 times per year    Active Member of [employer] or Organizations: Yes    Attends Archivist Meetings: 1 to 4 times per year    Marital Status: Married  Human resources officer Violence: Not At Risk (08/09/2021)   Humiliation, Afraid, Rape, and Kick questionnaire    Fear of Current or Ex-Partner: No    Emotionally Abused: No    Physically  Abused: No    Sexually Abused: No   Social History   Tobacco Use  Smoking Status Never  Smokeless Tobacco Never   Social History   Substance and Sexual Activity  Alcohol Use No   Alcohol/week: 0.0 standard drinks of alcohol    Family History:  Family History  Problem  Relation Age of Onset   Diabetes Mother    Hypertension Mother    Dementia Mother    Heart murmur Mother    Diabetes Father    Heart disease Father    Alzheimer's disease Father    Glaucoma Father    Colon cancer Father    Heart attack Father    Breast cancer Maternal Aunt     Past medical history, surgical history, medications, allergies, family history and social history reviewed with patient today and changes made to appropriate areas of the chart.   Review of Systems  HENT:  Ear pain: c.   Eyes:  Negative for blurred vision and double vision.  Respiratory:  Negative for shortness of breath.   Cardiovascular:  Negative for chest pain, palpitations and leg swelling.  Neurological:  Negative for dizziness and headaches.   All other ROS negative except what is listed above and in the HPI.      Objective:    LMP  (LMP Unknown)   Wt Readings from Last 3 Encounters:  12/20/21 192 lb 12.8 oz (87.5 kg)  10/04/21 192 lb 6.4 oz (87.3 kg)  07/25/21 186 lb (84.4 kg)    Physical Exam Vitals and nursing note reviewed.  Constitutional:      General: She is awake. She is not in acute distress.    Appearance: Normal appearance. She is well-developed. She is obese. She is not ill-appearing.  HENT:     Head: Normocephalic and atraumatic.     Right Ear: Hearing, tympanic membrane, ear canal and external ear normal. No drainage.     Left Ear: Hearing, tympanic membrane, ear canal and external ear normal. No drainage.     Nose: Nose normal.     Right Sinus: No maxillary sinus tenderness or frontal sinus tenderness.     Left Sinus: No maxillary sinus tenderness or frontal sinus tenderness.     Mouth/Throat:      Mouth: Mucous membranes are moist.     Pharynx: Oropharynx is clear. Uvula midline. No pharyngeal swelling, oropharyngeal exudate or posterior oropharyngeal erythema.  Eyes:     General: Lids are normal.        Right eye: No discharge.        Left eye: No discharge.     Extraocular Movements: Extraocular movements intact.     Conjunctiva/sclera: Conjunctivae normal.     Pupils: Pupils are equal, round, and reactive to light.     Visual Fields: Right eye visual fields normal and left eye visual fields normal.  Neck:     Thyroid: No thyromegaly.     Vascular: No carotid bruit.     Trachea: Trachea normal.  Cardiovascular:     Rate and Rhythm: Normal rate and regular rhythm.     Heart sounds: Normal heart sounds. No murmur heard.    No gallop.  Pulmonary:     Effort: Pulmonary effort is normal. No accessory muscle usage or respiratory distress.     Breath sounds: Normal breath sounds.  Chest:  Breasts:    Right: Normal.     Left: Normal.  Abdominal:     General: Bowel sounds are normal.     Palpations: Abdomen is soft. There is no hepatomegaly or splenomegaly.     Tenderness: There is no abdominal tenderness.  Musculoskeletal:        General: Normal range of motion.     Cervical back: Normal range of motion and neck supple.     Right  lower leg: No edema.     Left lower leg: No edema.  Lymphadenopathy:     Head:     Right side of head: No submental, submandibular, tonsillar, preauricular or posterior auricular adenopathy.     Left side of head: No submental, submandibular, tonsillar, preauricular or posterior auricular adenopathy.     Cervical: No cervical adenopathy.     Upper Body:     Right upper body: No supraclavicular, axillary or pectoral adenopathy.     Left upper body: No supraclavicular, axillary or pectoral adenopathy.  Skin:    General: Skin is warm and dry.     Capillary Refill: Capillary refill takes less than 2 seconds.     Findings: No rash.  Neurological:      Mental Status: She is alert and oriented to person, place, and time.     Gait: Gait is intact.     Deep Tendon Reflexes: Reflexes are normal and symmetric.     Reflex Scores:      Brachioradialis reflexes are 2+ on the right side and 2+ on the left side.      Patellar reflexes are 2+ on the right side and 2+ on the left side. Psychiatric:        Attention and Perception: Attention normal.        Mood and Affect: Mood normal.        Speech: Speech normal.        Behavior: Behavior normal. Behavior is cooperative.        Thought Content: Thought content normal.        Judgment: Judgment normal.     Results for orders placed or performed in visit on 12/20/21  Microscopic Examination   Urine  Result Value Ref Range   WBC, UA 0-5 0 - 5 /hpf   RBC, Urine None seen 0 - 2 /hpf   Epithelial Cells (non renal) 0-10 0 - 10 /hpf   Bacteria, UA None seen None seen/Few  HgB A1c  Result Value Ref Range   Hgb A1c MFr Bld 5.9 (H) 4.8 - 5.6 %   Est. average glucose Bld gHb Est-mCnc 123 mg/dL  CBC with Differential/Platelet  Result Value Ref Range   WBC 6.4 3.4 - 10.8 x10E3/uL   RBC 4.77 3.77 - 5.28 x10E6/uL   Hemoglobin 14.2 11.1 - 15.9 g/dL   Hematocrit 41.9 34.0 - 46.6 %   MCV 88 79 - 97 fL   MCH 29.8 26.6 - 33.0 pg   MCHC 33.9 31.5 - 35.7 g/dL   RDW 12.6 11.7 - 15.4 %   Platelets 260 150 - 450 x10E3/uL   Neutrophils 58 Not Estab. %   Lymphs 26 Not Estab. %   Monocytes 9 Not Estab. %   Eos 4 Not Estab. %   Basos 2 Not Estab. %   Neutrophils Absolute 3.8 1.4 - 7.0 x10E3/uL   Lymphocytes Absolute 1.7 0.7 - 3.1 x10E3/uL   Monocytes Absolute 0.6 0.1 - 0.9 x10E3/uL   EOS (ABSOLUTE) 0.3 0.0 - 0.4 x10E3/uL   Basophils Absolute 0.1 0.0 - 0.2 x10E3/uL   Immature Granulocytes 1 Not Estab. %   Immature Grans (Abs) 0.1 0.0 - 0.1 x10E3/uL  Comprehensive metabolic panel  Result Value Ref Range   Glucose 113 (H) 70 - 99 mg/dL   BUN 16 8 - 27 mg/dL   Creatinine, Ser 0.99 0.57 - 1.00 mg/dL    eGFR 61 >59 mL/min/1.73   BUN/Creatinine Ratio 16 12 - 28  Sodium 141 134 - 144 mmol/L   Potassium 4.1 3.5 - 5.2 mmol/L   Chloride 105 96 - 106 mmol/L   CO2 22 20 - 29 mmol/L   Calcium 9.8 8.7 - 10.3 mg/dL   Total Protein 6.7 6.0 - 8.5 g/dL   Albumin 4.7 3.7 - 4.7 g/dL   Globulin, Total 2.0 1.5 - 4.5 g/dL   Albumin/Globulin Ratio 2.4 (H) 1.2 - 2.2   Bilirubin Total 0.6 0.0 - 1.2 mg/dL   Alkaline Phosphatase 78 44 - 121 IU/L   AST 26 0 - 40 IU/L   ALT 23 0 - 32 IU/L  Lipid panel  Result Value Ref Range   Cholesterol, Total 222 (H) 100 - 199 mg/dL   Triglycerides 170 (H) 0 - 149 mg/dL   HDL 59 >39 mg/dL   VLDL Cholesterol Cal 30 5 - 40 mg/dL   LDL Chol Calc (NIH) 133 (H) 0 - 99 mg/dL   Chol/HDL Ratio 3.8 0.0 - 4.4 ratio  TSH  Result Value Ref Range   TSH 2.690 0.450 - 4.500 uIU/mL  Urinalysis, Routine w reflex microscopic  Result Value Ref Range   Specific Gravity, UA >1.030 (H) 1.005 - 1.030   pH, UA 5.5 5.0 - 7.5   Color, UA Yellow Yellow   Appearance Ur Clear Clear   Leukocytes,UA 1+ (A) Negative   Protein,UA Negative Negative/Trace   Glucose, UA Negative Negative   Ketones, UA Negative Negative   RBC, UA Negative Negative   Bilirubin, UA Negative Negative   Urobilinogen, Ur 0.2 0.2 - 1.0 mg/dL   Nitrite, UA Negative Negative   Microscopic Examination See below:   Cologuard  Result Value Ref Range   COLOGUARD Negative Negative      Assessment & Plan:   Problem List Items Addressed This Visit   None    Follow up plan: No follow-ups on file.   LABORATORY TESTING:  - Pap smear: not applicable  IMMUNIZATIONS:   - Tdap: Tetanus vaccination status reviewed: Not able to get due to medicare. - Influenza: Postponed to flu season - Pneumovax: up to date - Prevnar: Up to date - COVID: Up to date - HPV: Not applicable - Shingrix vaccine:  Discussed at visit today  SCREENING: -Mammogram: Ordered today  - Colonoscopy: Ordered today  - Bone Density: Not  applicable  -Hearing Test: Not applicable  -Spirometry: Not applicable   PATIENT COUNSELING:   Advised to take 1 mg of folate supplement per day if capable of pregnancy.   Sexuality: Discussed sexually transmitted diseases, partner selection, use of condoms, avoidance of unintended pregnancy  and contraceptive alternatives.   Advised to avoid cigarette smoking.  I discussed with the patient that most people either abstain from alcohol or drink within safe limits (<=14/week and <=4 drinks/occasion for males, <=7/weeks and <= 3 drinks/occasion for females) and that the risk for alcohol disorders and other health effects rises proportionally with the number of drinks per week and how often a drinker exceeds daily limits.  Discussed cessation/primary prevention of drug use and availability of treatment for abuse.   Diet: Encouraged to adjust caloric intake to maintain  or achieve ideal body weight, to reduce intake of dietary saturated fat and total fat, to limit sodium intake by avoiding high sodium foods and not adding table salt, and to maintain adequate dietary potassium and calcium preferably from fresh fruits, vegetables, and low-fat dairy products.    stressed the importance of regular exercise  Injury prevention: Discussed  safety belts, safety helmets, smoke detector, smoking near bedding or upholstery.   Dental health: Discussed importance of regular tooth brushing, flossing, and dental visits.    NEXT PREVENTATIVE PHYSICAL DUE IN 1 YEAR. No follow-ups on file.

## 2022-07-23 ENCOUNTER — Encounter: Payer: Self-pay | Admitting: Nurse Practitioner

## 2022-07-23 ENCOUNTER — Ambulatory Visit (INDEPENDENT_AMBULATORY_CARE_PROVIDER_SITE_OTHER): Payer: Medicare Other | Admitting: Nurse Practitioner

## 2022-07-23 VITALS — BP 136/73 | HR 58 | Temp 97.7°F | Wt 182.0 lb

## 2022-07-23 DIAGNOSIS — R7301 Impaired fasting glucose: Secondary | ICD-10-CM

## 2022-07-23 DIAGNOSIS — M25551 Pain in right hip: Secondary | ICD-10-CM | POA: Diagnosis not present

## 2022-07-23 DIAGNOSIS — M25552 Pain in left hip: Secondary | ICD-10-CM | POA: Diagnosis not present

## 2022-07-23 DIAGNOSIS — Z0000 Encounter for general adult medical examination without abnormal findings: Secondary | ICD-10-CM

## 2022-07-23 DIAGNOSIS — E7849 Other hyperlipidemia: Secondary | ICD-10-CM

## 2022-07-23 DIAGNOSIS — M25511 Pain in right shoulder: Secondary | ICD-10-CM | POA: Diagnosis not present

## 2022-07-23 MED ORDER — CLOPIDOGREL BISULFATE 75 MG PO TABS: 75.0000 mg | ORAL_TABLET | Freq: Every day | ORAL | 1 refills | Status: DC

## 2022-07-23 MED ORDER — FENOFIBRATE 160 MG PO TABS: 160.0000 mg | ORAL_TABLET | Freq: Every day | ORAL | 1 refills | Status: DC

## 2022-07-23 NOTE — Assessment & Plan Note (Signed)
Chronic.  Controlled.  Continue with current medication regimen of Fenofibrate daily.  Patient does not want to take Pravastatin any longer.  Discussed ASCVD risk score and potential for cardiac event.  Patient declined medication.  Labs ordered today.  Return to clinic in 6 months for reevaluation.  Call sooner if concerns arise.

## 2022-07-23 NOTE — Assessment & Plan Note (Signed)
Labs ordered at visit today.  Will make recommendations based on lab results.   

## 2022-07-24 LAB — COMPREHENSIVE METABOLIC PANEL
ALT: 15 IU/L (ref 0–32)
AST: 17 IU/L (ref 0–40)
Albumin/Globulin Ratio: 2.4 — ABNORMAL HIGH (ref 1.2–2.2)
Albumin: 4.7 g/dL (ref 3.8–4.8)
Alkaline Phosphatase: 76 IU/L (ref 44–121)
BUN/Creatinine Ratio: 12 (ref 12–28)
BUN: 13 mg/dL (ref 8–27)
Bilirubin Total: 0.4 mg/dL (ref 0.0–1.2)
CO2: 22 mmol/L (ref 20–29)
Calcium: 9.5 mg/dL (ref 8.7–10.3)
Chloride: 107 mmol/L — ABNORMAL HIGH (ref 96–106)
Creatinine, Ser: 1.07 mg/dL — ABNORMAL HIGH (ref 0.57–1.00)
Globulin, Total: 2 g/dL (ref 1.5–4.5)
Glucose: 105 mg/dL — ABNORMAL HIGH (ref 70–99)
Potassium: 4.2 mmol/L (ref 3.5–5.2)
Sodium: 144 mmol/L (ref 134–144)
Total Protein: 6.7 g/dL (ref 6.0–8.5)
eGFR: 55 mL/min/{1.73_m2} — ABNORMAL LOW (ref >59)

## 2022-07-24 LAB — HEMOGLOBIN A1C
Est. average glucose Bld gHb Est-mCnc: 114 mg/dL
Hgb A1c MFr Bld: 5.6 % (ref 4.8–5.6)

## 2022-07-24 LAB — LIPID PANEL
Chol/HDL Ratio: 3.7 ratio (ref 0.0–4.4)
Cholesterol, Total: 228 mg/dL — ABNORMAL HIGH (ref 100–199)
HDL: 62 mg/dL (ref >39)
LDL Chol Calc (NIH): 131 mg/dL — ABNORMAL HIGH (ref 0–99)
Triglycerides: 201 mg/dL — ABNORMAL HIGH (ref 0–149)
VLDL Cholesterol Cal: 35 mg/dL (ref 5–40)

## 2022-07-24 NOTE — Progress Notes (Signed)
Hi [name].  It was good to see you yesterday.  Your lab work looks good.  Your cholesterol did increase from prior.  I recommend a low fat diet.  I will let you know once I get those xray results back.

## 2022-07-25 ENCOUNTER — Ambulatory Visit
Admission: RE | Admit: 2022-07-25 | Discharge: 2022-07-25 | Disposition: A | Discharge status: Home / Self Care | Payer: Medicare Other | Source: Ambulatory Visit | Attending: Nurse Practitioner | Admitting: Nurse Practitioner

## 2022-07-25 ENCOUNTER — Ambulatory Visit
Admission: RE | Admit: 2022-07-25 | Discharge: 2022-07-25 | Disposition: A | Discharge status: Home / Self Care | Payer: Medicare Other | Source: Home / Self Care | Attending: Nurse Practitioner | Admitting: Nurse Practitioner

## 2022-07-25 DIAGNOSIS — M25511 Pain in right shoulder: Secondary | ICD-10-CM | POA: Insufficient documentation

## 2022-07-25 DIAGNOSIS — M25552 Pain in left hip: Secondary | ICD-10-CM | POA: Insufficient documentation

## 2022-07-25 DIAGNOSIS — M25551 Pain in right hip: Secondary | ICD-10-CM | POA: Insufficient documentation

## 2022-07-30 ENCOUNTER — Ambulatory Visit: Payer: Medicare Other

## 2022-07-30 NOTE — Progress Notes (Signed)
Please let patient know her hip xray is normal.  No evidence if arthritis.  I recommend she see Orthopedics for further evaluation and treatment.

## 2022-07-30 NOTE — Progress Notes (Signed)
Referrals are usually sent to [facility] which is in [place].

## 2022-07-30 NOTE — Progress Notes (Signed)
Please let patient know that her shoulder xray is normal.  I recommend she see Orthopedics for further evaluation and treatment.  If she agrees, I will place the referral.

## 2022-07-30 NOTE — Addendum Note (Signed)
Addended by: Jon Billings on: 07/30/2022 02:25 PM   Modules accepted: Orders

## 2022-08-03 DIAGNOSIS — M25552 Pain in left hip: Secondary | ICD-10-CM | POA: Diagnosis not present

## 2022-08-03 DIAGNOSIS — M545 Low back pain, unspecified: Secondary | ICD-10-CM | POA: Diagnosis not present

## 2022-08-03 DIAGNOSIS — M25551 Pain in right hip: Secondary | ICD-10-CM | POA: Diagnosis not present

## 2022-08-03 DIAGNOSIS — M5430 Sciatica, unspecified side: Secondary | ICD-10-CM | POA: Diagnosis not present

## 2022-08-14 ENCOUNTER — Ambulatory Visit: Payer: Medicare Other

## 2022-08-27 ENCOUNTER — Telehealth: Payer: Self-pay | Admitting: Nurse Practitioner

## 2022-08-27 NOTE — Telephone Encounter (Signed)
N/A unable to leave a message for patient to call back and schedule the Medicare Annual Wellness Visit (AWV) virtually or by telephone.  Last AWV 08/09/21  Please schedule at anytime with [facility]Nurse Health Advisor.  30 minute appointment  Any questions, please call me at [phone]

## 2022-08-28 DIAGNOSIS — M25551 Pain in right hip: Secondary | ICD-10-CM | POA: Diagnosis not present

## 2022-08-28 DIAGNOSIS — M25552 Pain in left hip: Secondary | ICD-10-CM | POA: Diagnosis not present

## 2022-09-06 ENCOUNTER — Telehealth: Payer: Self-pay | Admitting: *Deleted

## 2022-09-06 ENCOUNTER — Telehealth: Payer: Self-pay | Admitting: Nurse Practitioner

## 2022-09-06 NOTE — Telephone Encounter (Signed)
Copied from [place] [phone]. Topic: General - Other >> Sep 05, 2022 10:19 AM Chapman Fitch wrote: Reason for CRM: Pt asked if the social worker can give her a call / please advise

## 2022-09-06 NOTE — Patient Outreach (Signed)
  Care Coordination   Initial Visit Note   09/06/2022 Name: Bethany Lara MRN: [id number] DOB: 10/15/1950  Bethany Lara is a 71 y.o. year old female who sees Bethany Billings, NP for primary care. I spoke with  Bethany Lara by phone today.  What matters to the patients health and wellness today?  Placement options    Goals Addressed             This Visit's Progress    "I need resources for placement for my mom"       Care Coordination Interventions: Patient confirmed that her mother has Dementia and in need of a higher level of care Currently patient's mother has sitters from 11-4pm Mon-Thurs and her daughter and daughter in law also assist with patient's mother's care Facility care needs and medicaid eligibility discussed-placement options discussed long term care vs. Respite care Patient states that patient has a history of facility care-Assisted Living-but is in need of a higher level of care Patient considering all options and will call this social worker with any assistance needs Solution-Focused Strategies employed:  Active listening / Reflection utilized  Emotional Support Provided Caregiver stress acknowledged           SDOH assessments and interventions completed:  Yes  SDOH Interventions Today    Flowsheet Row Most Recent Value  SDOH Interventions   Food Insecurity Interventions Intervention Not Indicated  Housing Interventions Intervention Not Indicated  Transportation Interventions Intervention Not Indicated        Care Coordination Interventions:  Yes, provided   Follow up plan: No further intervention required.   Encounter Outcome:  Pt. Visit Completed

## 2022-09-06 NOTE — Patient Instructions (Signed)
Visit Information  Thank you for taking time to visit with me today. Please don't hesitate to contact me if I can be of assistance to you.   Following are the goals we discussed today:   Goals Addressed             This Visit's Progress    "I need resources for placement for my mom"       Care Coordination Interventions: Patient confirmed that her mother has Dementia and in need of a higher level of care Currently patient's mother has sitters from 11-4pm Mon-Thurs and her daughter and daughter in law also assist with patient's mother's care Facility care needs and medicaid eligibility discussed-placement options discussed long term care vs. Respite care Patient states that patient has a history of facility care-Assisted Living-but is in need of a higher level of care Patient considering all options and will call this social worker with any assistance needs Solution-Focused Strategies employed:  Active listening / Reflection utilized  Emotional Support Provided Caregiver stress acknowledged            Please call the care guide team at [phone] if you need to cancel or reschedule your appointment.   If you are experiencing a Mental Health or [place] or need someone to talk to, please call 911   Patient verbalizes understanding of instructions and care plan provided today and agrees to view in [place]. Active MyChart status and patient understanding of how to access instructions and care plan via MyChart confirmed with patient.     No further follow up required: patient to call this Education officer, museum with any additional placement needs  Elliot Gurney, [place] Worker  [facility] Care Management [phone]

## 2022-11-20 ENCOUNTER — Other Ambulatory Visit: Payer: Self-pay | Admitting: Nurse Practitioner

## 2022-11-21 NOTE — Telephone Encounter (Signed)
Requested medication (s) are due for refill today: no  Requested medication (s) are on the active medication list: yes  Last refill:  07/23/22 #90 1 RF  Future visit scheduled: yes  Notes to clinic:  overdue lab work- mail order requesting future refill   Requested Prescriptions  Pending Prescriptions Disp Refills   clopidogrel (PLAVIX) 75 MG tablet [Pharmacy Med Name: Clopidogrel Bisulfate 75 MG Oral Tablet] 100 tablet     Sig: TAKE 1 TABLET BY MOUTH DAILY     Hematology: Antiplatelets - clopidogrel Failed - 11/20/2022 10:46 PM      Failed - HCT in normal range and within 180 days    Hematocrit  Date Value Ref Range Status  12/21/2021 41.9 34.0 - 46.6 % Final         Failed - HGB in normal range and within 180 days    Hemoglobin  Date Value Ref Range Status  12/21/2021 14.2 11.1 - 15.9 g/dL Final         Failed - PLT in normal range and within 180 days    Platelets  Date Value Ref Range Status  12/21/2021 260 150 - 450 x10E3/uL Final         Failed - Cr in normal range and within 360 days    Creatinine, Ser  Date Value Ref Range Status  07/23/2022 1.07 (H) 0.57 - 1.00 mg/dL Final         Passed - Valid encounter within last 6 months    Recent Outpatient Visits           4 months ago IFG (impaired fasting glucose)   [place] Bethany Billings, NP   11 months ago Annual physical exam   [place] Bethany Billings, NP   1 year ago Acute bronchitis due to other specified organisms   [place] Bethany Billings, NP   1 year ago Acute non-recurrent sinusitis, unspecified location   [place] Lara, Bethany A, NP   1 year ago Lab test positive for detection of COVID-19 virus   [place] Bethany Billings, NP       Future Appointments             In 2 months Bethany Billings, NP [place], [facility]             Signed Prescriptions Disp Refills   fenofibrate 160 MG tablet 100 tablet 1    Sig: TAKE 1 TABLET BY MOUTH DAILY     Cardiovascular:  Antilipid - Fibric Acid Derivatives Failed - 11/20/2022 10:46 PM      Failed - Cr in normal range and within 360 days    Creatinine, Ser  Date Value Ref Range Status  07/23/2022 1.07 (H) 0.57 - 1.00 mg/dL Final         Failed - Lipid Panel in normal range within the last 12 months    Cholesterol, Total  Date Value Ref Range Status  07/23/2022 228 (H) 100 - 199 mg/dL Final   Cholesterol Piccolo, Waived  Date Value Ref Range Status  01/21/2017 184 <200 mg/dL Final    Comment:                            Desirable                <200  Borderline High      200- 239                         High                     >239    LDL Chol Calc (NIH)  Date Value Ref Range Status  07/23/2022 131 (H) 0 - 99 mg/dL Final   HDL  Date Value Ref Range Status  07/23/2022 62 >39 mg/dL Final   Triglycerides  Date Value Ref Range Status  07/23/2022 201 (H) 0 - 149 mg/dL Final   Triglycerides Piccolo,Waived  Date Value Ref Range Status  01/21/2017 144 <150 mg/dL Final    Comment:                            Normal                   <150                         Borderline High     150 - 199                         High                200 - 499                         Very High                >499          Passed - ALT in normal range and within 360 days    ALT  Date Value Ref Range Status  07/23/2022 15 0 - 32 IU/L Final         Passed - AST in normal range and within 360 days    AST  Date Value Ref Range Status  07/23/2022 17 0 - 40 IU/L Final         Passed - HGB in normal range and within 360 days    Hemoglobin  Date Value Ref Range Status  12/21/2021 14.2 11.1 - 15.9 g/dL Final         Passed - HCT in normal range and within 360 days    Hematocrit  Date Value Ref Range Status  12/21/2021 41.9 34.0 - 46.6 %  Final         Passed - PLT in normal range and within 360 days    Platelets  Date Value Ref Range Status  12/21/2021 260 150 - 450 x10E3/uL Final         Passed - WBC in normal range and within 360 days    WBC  Date Value Ref Range Status  12/21/2021 6.4 3.4 - 10.8 x10E3/uL Final  10/31/2017 13.6 (H) 3.6 - 11.0 K/uL Final         Passed - eGFR is 30 or above and within 360 days    GFR calc Af Amer  Date Value Ref Range Status  05/16/2020 68 >59 mL/min/1.73 Final    Comment:    **[facility] currently reports eGFR in compliance with the current**   recommendations of the [employer]. [facility] will   update reporting as new guidelines are published from the NKF-ASN  Task force.    GFR calc non Af Amer  Date Value Ref Range Status  05/16/2020 59 (L) >59 mL/min/1.73 Final   eGFR  Date Value Ref Range Status  07/23/2022 55 (L) >59 mL/min/1.73 Final         Passed - Valid encounter within last 12 months    Recent Outpatient Visits           4 months ago IFG (impaired fasting glucose)   [place] Bethany Billings, NP   11 months ago Annual physical exam   [place] Bethany Billings, NP   1 year ago Acute bronchitis due to other specified organisms   [place], NP   1 year ago Acute non-recurrent sinusitis, unspecified location   [place] Lara, Bethany A, NP   1 year ago Lab test positive for detection of COVID-19 virus   [place] Bethany Billings, NP       Future Appointments             In 2 months Bethany Billings, NP [place], [facility]

## 2022-11-21 NOTE — Telephone Encounter (Signed)
Requested Prescriptions  Pending Prescriptions Disp Refills   clopidogrel (PLAVIX) 75 MG tablet [Pharmacy Med Name: Clopidogrel Bisulfate 75 MG Oral Tablet] 100 tablet     Sig: TAKE 1 TABLET BY MOUTH DAILY     Hematology: Antiplatelets - clopidogrel Failed - 11/20/2022 10:46 PM      Failed - HCT in normal range and within 180 days    Hematocrit  Date Value Ref Range Status  12/21/2021 41.9 34.0 - 46.6 % Final         Failed - HGB in normal range and within 180 days    Hemoglobin  Date Value Ref Range Status  12/21/2021 14.2 11.1 - 15.9 g/dL Final         Failed - PLT in normal range and within 180 days    Platelets  Date Value Ref Range Status  12/21/2021 260 150 - 450 x10E3/uL Final         Failed - Cr in normal range and within 360 days    Creatinine, Ser  Date Value Ref Range Status  07/23/2022 1.07 (H) 0.57 - 1.00 mg/dL Final         Passed - Valid encounter within last 6 months    Recent Outpatient Visits           4 months ago IFG (impaired fasting glucose)   [place], Karen, NP   11 months ago Annual physical exam   [place] Jon Billings, NP   1 year ago Acute bronchitis due to other specified organisms   [place] Jon Billings, NP   1 year ago Acute non-recurrent sinusitis, unspecified location   [place] McElwee, Lauren A, NP   1 year ago Lab test positive for detection of COVID-19 virus   [place] Jon Billings, NP       Future Appointments             In 2 months Jon Billings, NP [place], [facility]             fenofibrate 160 MG tablet [Pharmacy Med Name: Fenofibrate 160 MG Oral Tablet] 100 tablet 1    Sig: TAKE 1 TABLET BY MOUTH DAILY     Cardiovascular:  Antilipid - Fibric Acid Derivatives Failed - 11/20/2022 10:46 PM      Failed - Cr in normal range and  within 360 days    Creatinine, Ser  Date Value Ref Range Status  07/23/2022 1.07 (H) 0.57 - 1.00 mg/dL Final         Failed - Lipid Panel in normal range within the last 12 months    Cholesterol, Total  Date Value Ref Range Status  07/23/2022 228 (H) 100 - 199 mg/dL Final   Cholesterol Piccolo, Waived  Date Value Ref Range Status  01/21/2017 184 <200 mg/dL Final    Comment:                            Desirable                <200                         Borderline High      200- 239  High                     >239    LDL Chol Calc (NIH)  Date Value Ref Range Status  07/23/2022 131 (H) 0 - 99 mg/dL Final   HDL  Date Value Ref Range Status  07/23/2022 62 >39 mg/dL Final   Triglycerides  Date Value Ref Range Status  07/23/2022 201 (H) 0 - 149 mg/dL Final   Triglycerides Piccolo,Waived  Date Value Ref Range Status  01/21/2017 144 <150 mg/dL Final    Comment:                            Normal                   <150                         Borderline High     150 - 199                         High                200 - 499                         Very High                >499          Passed - ALT in normal range and within 360 days    ALT  Date Value Ref Range Status  07/23/2022 15 0 - 32 IU/L Final         Passed - AST in normal range and within 360 days    AST  Date Value Ref Range Status  07/23/2022 17 0 - 40 IU/L Final         Passed - HGB in normal range and within 360 days    Hemoglobin  Date Value Ref Range Status  12/21/2021 14.2 11.1 - 15.9 g/dL Final         Passed - HCT in normal range and within 360 days    Hematocrit  Date Value Ref Range Status  12/21/2021 41.9 34.0 - 46.6 % Final         Passed - PLT in normal range and within 360 days    Platelets  Date Value Ref Range Status  12/21/2021 260 150 - 450 x10E3/uL Final         Passed - WBC in normal range and within 360 days    WBC  Date Value Ref Range Status   12/21/2021 6.4 3.4 - 10.8 x10E3/uL Final  10/31/2017 13.6 (H) 3.6 - 11.0 K/uL Final         Passed - eGFR is 30 or above and within 360 days    GFR calc Af Amer  Date Value Ref Range Status  05/16/2020 68 >59 mL/min/1.73 Final    Comment:    **[facility] currently reports eGFR in compliance with the current**   recommendations of the [employer]. [facility] will   update reporting as new guidelines are published from the NKF-ASN   Task force.    GFR calc non Af Amer  Date Value Ref Range Status  05/16/2020 59 (L) >59 mL/min/1.73 Final   eGFR  Date Value Ref Range Status  07/23/2022 55 (L) >59 mL/min/1.73 Final         Passed - Valid encounter within last 12 months    Recent Outpatient Visits           4 months ago IFG (impaired fasting glucose)   [place] Jon Billings, NP   11 months ago Annual physical exam   [place] Jon Billings, NP   1 year ago Acute bronchitis due to other specified organisms   [place], NP   1 year ago Acute non-recurrent sinusitis, unspecified location   [place] McElwee, Scheryl Darter, NP   1 year ago Lab test positive for detection of COVID-19 virus   [place] Jon Billings, NP       Future Appointments             In 2 months Jon Billings, NP [place], [facility]

## 2023-01-23 ENCOUNTER — Ambulatory Visit (INDEPENDENT_AMBULATORY_CARE_PROVIDER_SITE_OTHER): Payer: Medicare Other | Admitting: Nurse Practitioner

## 2023-01-23 ENCOUNTER — Encounter: Payer: Self-pay | Admitting: Nurse Practitioner

## 2023-01-23 VITALS — BP 126/74 | HR 72 | Temp 98.5°F | Ht 61.5 in | Wt 184.6 lb

## 2023-01-23 DIAGNOSIS — E7849 Other hyperlipidemia: Secondary | ICD-10-CM

## 2023-01-23 DIAGNOSIS — Z6834 Body mass index (BMI) 34.0-34.9, adult: Secondary | ICD-10-CM | POA: Diagnosis not present

## 2023-01-23 DIAGNOSIS — Z1231 Encounter for screening mammogram for malignant neoplasm of breast: Secondary | ICD-10-CM

## 2023-01-23 DIAGNOSIS — Z7189 Other specified counseling: Secondary | ICD-10-CM

## 2023-01-23 DIAGNOSIS — E669 Obesity, unspecified: Secondary | ICD-10-CM

## 2023-01-23 DIAGNOSIS — R7303 Prediabetes: Secondary | ICD-10-CM | POA: Diagnosis not present

## 2023-01-23 DIAGNOSIS — Z0000 Encounter for general adult medical examination without abnormal findings: Secondary | ICD-10-CM | POA: Diagnosis not present

## 2023-01-23 LAB — MICROSCOPIC EXAMINATION: Bacteria, UA: NONE SEEN

## 2023-01-23 LAB — URINALYSIS, ROUTINE W REFLEX MICROSCOPIC
Bilirubin, UA: NEGATIVE
Glucose, UA: NEGATIVE
Ketones, UA: NEGATIVE
Nitrite, UA: NEGATIVE
Protein,UA: NEGATIVE
RBC, UA: NEGATIVE
Specific Gravity, UA: 1.025 (ref 1.005–1.030)
Urobilinogen, Ur: 0.2 mg/dL (ref 0.2–1.0)
pH, UA: 5 (ref 5.0–7.5)

## 2023-01-23 MED ORDER — CLOPIDOGREL BISULFATE 75 MG PO TABS: 75.0000 mg | ORAL_TABLET | Freq: Every day | ORAL | 1 refills | Status: DC

## 2023-01-23 MED ORDER — FENOFIBRATE 160 MG PO TABS: 160.0000 mg | ORAL_TABLET | Freq: Every day | ORAL | 1 refills | Status: DC

## 2023-01-23 NOTE — Patient Instructions (Signed)
Please call to schedule your mammogram and/or bone density: [facility] at [facility]  Address: [address], [place], [place] [address] Phone: [phone]  [facility] [place] [address] [place],  [place]  [address] Phone: [phone]   

## 2023-01-23 NOTE — Assessment & Plan Note (Signed)
A voluntary discussion about advance care planning including the explanation and discussion of advance directives was extensively discussed  with the patient for 5 minutes with patient.  Explanation about the health care proxy and Living will was reviewed and packet with forms with explanation of how to fill them out was given.  During this discussion, the patient was able to identify a health care proxy as her husband.  Does not wish to make changes at this time.

## 2023-01-23 NOTE — Assessment & Plan Note (Signed)
Controlled.  Last A1c was in normal range.  Labs ordered at visit today.  Will make recommendations based on lab results.

## 2023-01-23 NOTE — Assessment & Plan Note (Signed)
Chronic.  Controlled.  Continue with current medication regimen of Fenofibrate daily.  Refills sent today.  Labs ordered today.  Return to clinic in 6 months for reevaluation.  Call sooner if concerns arise.

## 2023-01-23 NOTE — Progress Notes (Signed)
BP 126/74   Pulse 72   Temp 98.5 F (36.9 C) (Oral)   Ht 5' 1.5" (1.562 m)   Wt 184 lb 9.6 oz (83.7 kg)   LMP  (LMP Unknown)   SpO2 98%   BMI 34.32 kg/m    Subjective:    Patient ID: Bethany Lara, female    DOB: 07-01-1951, 72 y.o.   MRN: [id number]  HPI: Bethany Lara is a 72 y.o. female presenting on 01/23/2023 for comprehensive medical examination. Current medical complaints include:none  She currently lives with: Menopausal Symptoms: no  HYPERLIPIDEMIA Hyperlipidemia status: excellent compliance Satisfied with current treatment?  yes Side effects:  no Medication compliance: excellent compliance Past cholesterol meds: fenofibrate (tricor) Supplements: none Aspirin:  no The ASCVD Risk score (Arnett DK, et al., 2019) failed to calculate for the following reasons:   The patient has a prior MI or stroke diagnosis Chest pain:  no Coronary artery disease:  no Family history CAD:  no Family history early CAD:  no   Functional Status Survey: Is the patient deaf or have difficulty hearing?: No Does the patient have difficulty seeing, even when wearing glasses/contacts?: No Does the patient have difficulty concentrating, remembering, or making decisions?: No Does the patient have difficulty walking or climbing stairs?: Yes Does the patient have difficulty dressing or bathing?: No Does the patient have difficulty doing errands alone such as visiting a doctor's office or shopping?: No     01/23/2023    8:16 AM 07/23/2022    8:14 AM 12/20/2021    8:36 AM 10/04/2021    1:39 PM 08/09/2021   10:42 AM  Fall Risk   Falls in the past year? 0 0 0 1 1  Number falls in past yr: 0 0 0 0 0  Injury with Fall? 0 0 0 0 0  Risk for fall due to : No Fall Risks No Fall Risks No Fall Risks No Fall Risks   Follow up Falls evaluation completed Falls evaluation completed Falls evaluation completed Falls evaluation completed Falls evaluation completed;Falls prevention discussed     Depression Screen    01/23/2023    8:16 AM 07/23/2022    8:14 AM 12/20/2021    8:36 AM 10/04/2021    1:39 PM 07/25/2021    2:17 PM  Depression screen PHQ 2/9  Decreased Interest 0 1 1 0 0  Down, Depressed, Hopeless 0 2 2 1 1   PHQ - 2 Score 0 3 3 1 1   Altered sleeping 2 3 2 3 1   Tired, decreased energy 0 2 1 1 1   Change in appetite 0 0 0 0 1  Feeling bad or failure about yourself  0 1 0 0 0  Trouble concentrating 0 1 0 0 0  Moving slowly or fidgety/restless 0 0 0 0 0  Suicidal thoughts 0 0 0 0 1  PHQ-9 Score 2 10 6 5 5   Difficult doing work/chores Not difficult at all Somewhat difficult Not difficult at all Not difficult at all Not difficult at all     Advanced Directives Does patient have a HCPOA?    yes If yes, name and contact information: Her Husband Does patient have a living will or MOST form?  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   CO ENZYME Q-10 PO Take by mouth daily.   diclofenac Sodium (VOLTAREN) 1 % GEL Apply 2 g topically 4 (four) times daily.   MAGNESIUM PO Take by mouth daily.   Multiple Vitamin (MULTIVITAMIN) tablet Place 1 tablet into feeding tube daily. (Patient taking differently: Take 1 tablet by mouth daily.)    Vitamin D, Cholecalciferol, 1000 units TABS Give 2,000 Units by tube daily. (Patient taking differently: Take 2,000 Units by mouth daily.)   NON FORMULARY CPAP @@ bedtime. (Patient not taking: Reported on 01/23/2023)   No current facility-administered medications on file prior to visit.    Allergies:  Allergies  Allergen Reactions   Crestor [Rosuvastatin Calcium] Other (See Comments)    myalgias   Zocor [Simvastatin] Other (See Comments)    Fatigue, legs achey     Social History:  Social History   Socioeconomic History   Marital status: Married    Spouse name: Not on file   Number of children: Not on file   Years of education: 12th grade   Highest education level: Not on file  Occupational History   Occupation: retired  Tobacco Use   Smoking status: Never   Smokeless tobacco: Never  Vaping Use   Vaping Use: Never used  Substance and Sexual Activity   Alcohol use: No    Alcohol/week: 0.0 standard drinks of alcohol   Drug use: No   Sexual activity: Not Currently  Other Topics Concern   Not on file  Social History Narrative   Not on file   Social Determinants of Health   Financial Resource Strain: Low Risk  (08/09/2021)   Overall Financial Resource Strain (CARDIA)    Difficulty of Paying Living Expenses: Not hard at all  Food Insecurity: No Food Insecurity (09/06/2022)   Hunger Vital Sign    Worried About Running Out of Food in the Last Year: Never true    Ran Out of Food in the Last Year: Never true  Transportation Needs: No Transportation Needs (09/06/2022)   PRAPARE - Administrator, Civil Service (Medical): No    Lack of Transportation (Non-Medical): No  Physical Activity: Inactive (08/09/2021)   Exercise Vital Sign    Days of Exercise per Week: 0 days    Minutes of Exercise per Session: 0 min  Stress: Stress Concern Present (08/09/2021)   [employer] of Occupational Health - Occupational Stress Questionnaire    Feeling of Stress : To some  extent  Social Connections: Socially Integrated (08/09/2021)   Social Connection and Isolation Panel [NHANES]    Frequency of Communication with Friends and Family: More than three times a week    Frequency of Social Gatherings with Friends and Family: Twice a week    Attends Religious Services: More than 4 times per year    Active Member of [employer] or Organizations: Yes    Attends Banker Meetings: 1  to 4 times per year    Marital Status: Married  Catering manager Violence: Not At Risk (08/09/2021)   Humiliation, Afraid, Rape, and Kick questionnaire    Fear of Current or Ex-Partner: No    Emotionally Abused: No    Physically Abused: No    Sexually Abused: No   Social History   Tobacco Use  Smoking Status Never  Smokeless Tobacco Never   Social History   Substance and Sexual Activity  Alcohol Use No   Alcohol/week: 0.0 standard drinks of alcohol    Family History:  Family History  Problem Relation Age of Onset   Diabetes Mother    Hypertension Mother    Dementia Mother    Heart murmur Mother    Diabetes Father    Heart disease Father    Alzheimer's disease Father    Glaucoma Father    Colon cancer Father    Heart attack Father    Breast cancer Maternal Aunt     Past medical history, surgical history, medications, allergies, family history and social history reviewed with patient today and changes made to appropriate areas of the chart.   Review of Systems  Eyes:  Negative for blurred vision and double vision.  Respiratory:  Negative for shortness of breath.   Cardiovascular:  Negative for chest pain, palpitations and leg swelling.  Neurological:  Negative for dizziness and headaches.    All other ROS negative except what is listed above and in the HPI.      Objective:    BP 126/74   Pulse 72   Temp 98.5 F (36.9 C) (Oral)   Ht 5' 1.5" (1.562 m)   Wt 184 lb 9.6 oz (83.7 kg)   LMP  (LMP Unknown)   SpO2 98%   BMI 34.32 kg/m   Wt Readings  from Last 3 Encounters:  01/23/23 184 lb 9.6 oz (83.7 kg)  07/23/22 182 lb (82.6 kg)  12/20/21 192 lb 12.8 oz (87.5 kg)    No results found.  Physical Exam Vitals and nursing note reviewed.  Constitutional:      General: She is awake. She is not in acute distress.    Appearance: Normal appearance. She is well-developed. She is not ill-appearing.  HENT:     Head: Normocephalic and atraumatic.     Right Ear: Hearing, tympanic membrane, ear canal and external ear normal. No drainage.     Left Ear: Hearing, tympanic membrane, ear canal and external ear normal. No drainage.     Nose: Nose normal.     Right Sinus: No maxillary sinus tenderness or frontal sinus tenderness.     Left Sinus: No maxillary sinus tenderness or frontal sinus tenderness.     Mouth/Throat:     Mouth: Mucous membranes are moist.     Pharynx: Oropharynx is clear. Uvula midline. No pharyngeal swelling, oropharyngeal exudate or posterior oropharyngeal erythema.  Eyes:     General: Lids are normal.        Right eye: No discharge.        Left eye: No discharge.     Extraocular Movements: Extraocular movements intact.     Conjunctiva/sclera: Conjunctivae normal.     Pupils: Pupils are equal, round, and reactive to light.     Visual Fields: Right eye visual fields normal and left eye visual fields normal.  Neck:     Thyroid: No thyromegaly.     Vascular: No carotid bruit.     Trachea: Trachea normal.  Cardiovascular:  Rate and Rhythm: Normal rate and regular rhythm.     Heart sounds: Normal heart sounds. No murmur heard.    No gallop.  Pulmonary:     Effort: Pulmonary effort is normal. No accessory muscle usage or respiratory distress.     Breath sounds: Normal breath sounds.  Chest:  Breasts:    Right: Normal.     Left: Normal.  Abdominal:     General: Bowel sounds are normal.     Palpations: Abdomen is soft. There is no hepatomegaly or splenomegaly.     Tenderness: There is no abdominal tenderness.   Musculoskeletal:        General: Normal range of motion.     Cervical back: Normal range of motion and neck supple.     Right lower leg: No edema.     Left lower leg: No edema.  Lymphadenopathy:     Head:     Right side of head: No submental, submandibular, tonsillar, preauricular or posterior auricular adenopathy.     Left side of head: No submental, submandibular, tonsillar, preauricular or posterior auricular adenopathy.     Cervical: No cervical adenopathy.     Upper Body:     Right upper body: No supraclavicular, axillary or pectoral adenopathy.     Left upper body: No supraclavicular, axillary or pectoral adenopathy.  Skin:    General: Skin is warm and dry.     Capillary Refill: Capillary refill takes less than 2 seconds.     Findings: No rash.  Neurological:     Mental Status: She is alert and oriented to person, place, and time.     Gait: Gait is intact.  Psychiatric:        Attention and Perception: Attention normal.        Mood and Affect: Mood normal.        Speech: Speech normal.        Behavior: Behavior normal. Behavior is cooperative.        Thought Content: Thought content normal.        Judgment: Judgment normal.        01/23/2023    8:13 AM 08/08/2020    9:10 AM 07/21/2018   10:28 AM 07/17/2017    9:44 AM  6CIT Screen  What Year? 0 points 0 points 0 points 0 points  What month? 0 points 0 points 0 points 0 points  What time? 0 points 0 points 0 points 0 points  Count back from 20 0 points 0 points 0 points 0 points  Months in reverse 0 points 0 points 0 points 0 points  Repeat phrase 0 points 0 points 2 points 2 points  Total Score 0 points 0 points 2 points 2 points       01/23/2023    8:13 AM 08/08/2020    9:10 AM 07/21/2018   10:28 AM 07/17/2017    9:44 AM  6CIT Screen  What Year? 0 points 0 points 0 points 0 points  What month? 0 points 0 points 0 points 0 points  What time? 0 points 0 points 0 points 0 points  Count back from 20 0 points 0  points 0 points 0 points  Months in reverse 0 points 0 points 0 points 0 points  Repeat phrase 0 points 0 points 2 points 2 points  Total Score 0 points 0 points 2 points 2 points      Results for orders placed or performed in visit on 07/23/22  Comprehensive  metabolic panel  Result Value Ref Range   Glucose 105 (H) 70 - 99 mg/dL   BUN 13 8 - 27 mg/dL   Creatinine, Ser 1.61 (H) 0.57 - 1.00 mg/dL   eGFR 55 (L) >09 UE/AVW/0.98   BUN/Creatinine Ratio 12 12 - 28   Sodium 144 134 - 144 mmol/L   Potassium 4.2 3.5 - 5.2 mmol/L   Chloride 107 (H) 96 - 106 mmol/L   CO2 22 20 - 29 mmol/L   Calcium 9.5 8.7 - 10.3 mg/dL   Total Protein 6.7 6.0 - 8.5 g/dL   Albumin 4.7 3.8 - 4.8 g/dL   Globulin, Total 2.0 1.5 - 4.5 g/dL   Albumin/Globulin Ratio 2.4 (H) 1.2 - 2.2   Bilirubin Total 0.4 0.0 - 1.2 mg/dL   Alkaline Phosphatase 76 44 - 121 IU/L   AST 17 0 - 40 IU/L   ALT 15 0 - 32 IU/L  Lipid panel  Result Value Ref Range   Cholesterol, Total 228 (H) 100 - 199 mg/dL   Triglycerides 119 (H) 0 - 149 mg/dL   HDL 62 >14 mg/dL   VLDL Cholesterol Cal 35 5 - 40 mg/dL   LDL Chol Calc (NIH) 782 (H) 0 - 99 mg/dL   Chol/HDL Ratio 3.7 0.0 - 4.4 ratio  HgB A1c  Result Value Ref Range   Hgb A1c MFr Bld 5.6 4.8 - 5.6 %   Est. average glucose Bld gHb Est-mCnc 114 mg/dL      Assessment & Plan:   Problem List Items Addressed This Visit       Other   Hyperlipidemia    Chronic.  Controlled.  Continue with current medication regimen of Fenofibrate daily.  Refills sent today.  Labs ordered today.  Return to clinic in 6 months for reevaluation.  Call sooner if concerns arise.        Relevant Medications   fenofibrate 160 MG tablet   Other Relevant Orders   Lipid panel   Obesity (BMI 30-39.9)    Recommended eating smaller high protein, low fat meals more frequently and exercising 30 mins a day 5 times a week with a goal of 10-15lb weight loss in the next 3 months.      Prediabetes    Controlled.   Last A1c was in normal range.  Labs ordered at visit today.  Will make recommendations based on lab results.         Relevant Orders   HgB A1c   Advanced care planning/counseling discussion    A voluntary discussion about advance care planning including the explanation and discussion of advance directives was extensively discussed  with the patient for 5 minutes with patient.  Explanation about the health care proxy and Living will was reviewed and packet with forms with explanation of how to fill them out was given.  During this discussion, the patient was able to identify a health care proxy as her husband.  Does not wish to make changes at this time.       Other Visit Diagnoses     Encounter for annual wellness exam in Medicare patient    -  Primary   Annual physical exam       Relevant Orders   CBC with Differential/Platelet   Comprehensive metabolic panel   Lipid panel   TSH   Urinalysis, Routine w reflex microscopic   HgB A1c   Encounter for screening mammogram for malignant neoplasm of breast  Relevant Orders   MM 3D SCREENING MAMMOGRAM BILATERAL BREAST        Preventative Services:  AAA screening: NA Health Risk Assessment and Personalized Prevention Plan: Up to date Bone Mass Measurements: Up to date Breast Cancer Screening: Up to date CVD Screening: Up to date Cervical Cancer Screening: NA Colon Cancer Screening: Up to date Depression Screening: Up to date Diabetes Screening: Up to date Glaucoma Screening: Up to date Hepatitis B vaccine: NA Hepatitis C screening: Up to date HIV Screening: Up to date Flu Vaccine: Up to date Lung cancer Screening: NA Obesity Screening:  UP to date Pneumonia Vaccines (2): UP to date  STI Screening: NA  Follow up plan: Return in about 6 months (around 07/26/2023) for HTN, HLD, DM2 FU.   LABORATORY TESTING:  - Pap smear: not applicable  IMMUNIZATIONS:   - Tdap: Tetanus vaccination status reviewed: On Medicare. -  Influenza: Postponed to flu season - Pneumovax: Up to date - Prevnar: Up to date - Zostavax vaccine:  Discussed at visit today.   SCREENING: -Mammogram: Up to date  - Colonoscopy: Up to date  - Bone Density: Up to date  -Hearing Test: Not applicable  -Spirometry: Not applicable   PATIENT COUNSELING:   Advised to take 1 mg of folate supplement per day if capable of pregnancy.   Sexuality: Discussed sexually transmitted diseases, partner selection, use of condoms, avoidance of unintended pregnancy  and contraceptive alternatives.   Advised to avoid cigarette smoking.  I discussed with the patient that most people either abstain from alcohol or drink within safe limits (<=14/week and <=4 drinks/occasion for males, <=7/weeks and <= 3 drinks/occasion for females) and that the risk for alcohol disorders and other health effects rises proportionally with the number of drinks per week and how often a drinker exceeds daily limits.  Discussed cessation/primary prevention of drug use and availability of treatment for abuse.   Diet: Encouraged to adjust caloric intake to maintain  or achieve ideal body weight, to reduce intake of dietary saturated fat and total fat, to limit sodium intake by avoiding high sodium foods and not adding table salt, and to maintain adequate dietary potassium and calcium preferably from fresh fruits, vegetables, and low-fat dairy products.    stressed the importance of regular exercise  Injury prevention: Discussed safety belts, safety helmets, smoke detector, smoking near bedding or upholstery.   Dental health: Discussed importance of regular tooth brushing, flossing, and dental visits.    NEXT PREVENTATIVE PHYSICAL DUE IN 1 YEAR. Return in about 6 months (around 07/26/2023) for HTN, HLD, DM2 FU.

## 2023-01-23 NOTE — Assessment & Plan Note (Signed)
Recommended eating smaller high protein, low fat meals more frequently and exercising 30 mins a day 5 times a week with a goal of 10-15lb weight loss in the next 3 months.  

## 2023-01-24 LAB — CBC WITH DIFFERENTIAL/PLATELET
Basophils Absolute: 0.1 10*3/uL (ref 0.0–0.2)
Basos: 1 %
EOS (ABSOLUTE): 0.4 10*3/uL (ref 0.0–0.4)
Eos: 5 %
Hematocrit: 43.3 % (ref 34.0–46.6)
Hemoglobin: 14.4 g/dL (ref 11.1–15.9)
Immature Grans (Abs): 0 10*3/uL (ref 0.0–0.1)
Immature Granulocytes: 0 %
Lymphocytes Absolute: 1.8 10*3/uL (ref 0.7–3.1)
Lymphs: 25 %
MCH: 29.4 pg (ref 26.6–33.0)
MCHC: 33.3 g/dL (ref 31.5–35.7)
MCV: 89 fL (ref 79–97)
Monocytes Absolute: 0.7 10*3/uL (ref 0.1–0.9)
Monocytes: 9 %
Neutrophils Absolute: 4.3 10*3/uL (ref 1.4–7.0)
Neutrophils: 60 %
Platelets: 293 10*3/uL (ref 150–450)
RBC: 4.89 x10E6/uL (ref 3.77–5.28)
RDW: 12.5 % (ref 11.7–15.4)
WBC: 7.3 10*3/uL (ref 3.4–10.8)

## 2023-01-24 LAB — LIPID PANEL
Chol/HDL Ratio: 4.3 ratio (ref 0.0–4.4)
Cholesterol, Total: 251 mg/dL — ABNORMAL HIGH (ref 100–199)
HDL: 58 mg/dL (ref >39)
LDL Chol Calc (NIH): 168 mg/dL — ABNORMAL HIGH (ref 0–99)
Triglycerides: 138 mg/dL (ref 0–149)
VLDL Cholesterol Cal: 25 mg/dL (ref 5–40)

## 2023-01-24 LAB — COMPREHENSIVE METABOLIC PANEL
ALT: 21 IU/L (ref 0–32)
AST: 20 IU/L (ref 0–40)
Albumin/Globulin Ratio: 1.9 (ref 1.2–2.2)
Albumin: 4.5 g/dL (ref 3.8–4.8)
Alkaline Phosphatase: 93 IU/L (ref 44–121)
BUN/Creatinine Ratio: 19 (ref 12–28)
BUN: 19 mg/dL (ref 8–27)
Bilirubin Total: 0.6 mg/dL (ref 0.0–1.2)
CO2: 19 mmol/L — ABNORMAL LOW (ref 20–29)
Calcium: 9.6 mg/dL (ref 8.7–10.3)
Chloride: 103 mmol/L (ref 96–106)
Creatinine, Ser: 0.98 mg/dL (ref 0.57–1.00)
Globulin, Total: 2.4 g/dL (ref 1.5–4.5)
Glucose: 112 mg/dL — ABNORMAL HIGH (ref 70–99)
Potassium: 4.3 mmol/L (ref 3.5–5.2)
Sodium: 140 mmol/L (ref 134–144)
Total Protein: 6.9 g/dL (ref 6.0–8.5)
eGFR: 61 mL/min/{1.73_m2} (ref >59)

## 2023-01-24 LAB — TSH: TSH: 2.33 u[IU]/mL (ref 0.450–4.500)

## 2023-01-24 LAB — HEMOGLOBIN A1C
Est. average glucose Bld gHb Est-mCnc: 114 mg/dL
Hgb A1c MFr Bld: 5.6 % (ref 4.8–5.6)

## 2023-01-24 NOTE — Progress Notes (Signed)
Hi [name]. It was nice to see you yesterday.  Your lab work looks good. Your cholesterol is a little elevated from prior.  I recommend a low fat diet and exercise.  Your A1c remains well controlled at 5.6%.  No concerns at this time. Continue with your current medication regimen.  Follow up as discussed.  Please let me know if you have any questions.

## 2023-02-05 ENCOUNTER — Ambulatory Visit
Admission: RE | Admit: 2023-02-05 | Discharge: 2023-02-05 | Disposition: A | Discharge status: Home / Self Care | Payer: Medicare Other | Source: Ambulatory Visit | Attending: Nurse Practitioner | Admitting: Nurse Practitioner

## 2023-02-05 DIAGNOSIS — Z1231 Encounter for screening mammogram for malignant neoplasm of breast: Secondary | ICD-10-CM | POA: Diagnosis not present

## 2023-02-06 NOTE — Progress Notes (Signed)
Please let patient know her Mammogram did not show any evidence of a malignancy.  The recommendation is to repeat the Mammogram in 1 year.  

## 2023-04-23 DIAGNOSIS — H40003 Preglaucoma, unspecified, bilateral: Secondary | ICD-10-CM | POA: Diagnosis not present

## 2023-04-23 DIAGNOSIS — H35371 Puckering of macula, right eye: Secondary | ICD-10-CM | POA: Diagnosis not present

## 2023-04-23 DIAGNOSIS — H2513 Age-related nuclear cataract, bilateral: Secondary | ICD-10-CM | POA: Diagnosis not present

## 2023-05-07 ENCOUNTER — Other Ambulatory Visit: Payer: Self-pay | Admitting: Nurse Practitioner

## 2023-05-09 NOTE — Telephone Encounter (Signed)
Requested Prescriptions  Pending Prescriptions Disp Refills   clopidogrel (PLAVIX) 75 MG tablet [Pharmacy Med Name: Clopidogrel Bisulfate 75 MG Oral Tablet] 100 tablet 0    Sig: TAKE 1 TABLET BY MOUTH DAILY     Hematology: Antiplatelets - clopidogrel Passed - 05/07/2023 10:21 PM      Passed - HCT in normal range and within 180 days    Hematocrit  Date Value Ref Range Status  01/23/2023 43.3 34.0 - 46.6 % Final         Passed - HGB in normal range and within 180 days    Hemoglobin  Date Value Ref Range Status  01/23/2023 14.4 11.1 - 15.9 g/dL Final         Passed - PLT in normal range and within 180 days    Platelets  Date Value Ref Range Status  01/23/2023 293 150 - 450 x10E3/uL Final         Passed - Cr in normal range and within 360 days    Creatinine, Ser  Date Value Ref Range Status  01/23/2023 0.98 0.57 - 1.00 mg/dL Final         Passed - Valid encounter within last 6 months    Recent Outpatient Visits           3 months ago Encounter for annual wellness exam in Medicare patient   [place] [facility] Larae Grooms, NP   9 months ago IFG (impaired fasting glucose)   [place] [facility] Larae Grooms, NP   1 year ago Annual physical exam   [place] [facility] Larae Grooms, NP   1 year ago Acute bronchitis due to other specified organisms   [place] [facility] Larae Grooms, NP   1 year ago Acute non-recurrent sinusitis, unspecified location    [facility] Gerre Scull, NP       Future Appointments             In 2 months Larae Grooms, NP  [facility]

## 2023-07-20 ENCOUNTER — Other Ambulatory Visit: Payer: Self-pay | Admitting: Nurse Practitioner

## 2023-07-22 NOTE — Telephone Encounter (Signed)
Requested Prescriptions  Pending Prescriptions Disp Refills   clopidogrel (PLAVIX) 75 MG tablet [Pharmacy Med Name: Clopidogrel Bisulfate 75 MG Oral Tablet] 100 tablet 1    Sig: TAKE 1 TABLET BY MOUTH DAILY     Hematology: Antiplatelets - clopidogrel Passed - 07/20/2023  4:27 AM      Passed - HCT in normal range and within 180 days    Hematocrit  Date Value Ref Range Status  01/23/2023 43.3 34.0 - 46.6 % Final         Passed - HGB in normal range and within 180 days    Hemoglobin  Date Value Ref Range Status  01/23/2023 14.4 11.1 - 15.9 g/dL Final         Passed - PLT in normal range and within 180 days    Platelets  Date Value Ref Range Status  01/23/2023 293 150 - 450 x10E3/uL Final         Passed - Cr in normal range and within 360 days    Creatinine, Ser  Date Value Ref Range Status  01/23/2023 0.98 0.57 - 1.00 mg/dL Final         Passed - Valid encounter within last 6 months    Recent Outpatient Visits           6 months ago Encounter for annual wellness exam in Medicare patient   [place] [facility] Larae Grooms, NP   12 months ago IFG (impaired fasting glucose)   [place] [facility] Larae Grooms, NP   1 year ago Annual physical exam   [place] [facility] Larae Grooms, NP   1 year ago Acute bronchitis due to other specified organisms   [place] [facility] Larae Grooms, NP   1 year ago Acute non-recurrent sinusitis, unspecified location   [place] [facility] Gerre Scull, NP       Future Appointments             In 1 week Larae Grooms, NP  [facility]

## 2023-07-26 NOTE — Progress Notes (Unsigned)
LMP  (LMP Unknown)    Subjective:    Patient ID: Bethany Lara, female    DOB: 1951-01-18, 72 y.o.   MRN: [id number]  HPI: Bethany Lara is a 72 y.o. female  No chief complaint on file.  HYPERLIPIDEMIA Hyperlipidemia status: excellent compliance Satisfied with current treatment?  no Side effects:  no Medication compliance: excellent compliance Past cholesterol meds: stopped taking pravastatin.  She is still taking the Fenofibrate. Supplements: none Aspirin:   plavix The ASCVD Risk score (Arnett DK, et al., 2019) failed to calculate for the following reasons:   The patient has a prior MI or stroke diagnosis Chest pain:  no Coronary artery disease:  no Family history CAD:  no Family history early CAD:  no  Denies HA, CP, SOB, dizziness, palpitations, visual changes, and lower extremity swelling.  Patient states she didn't sleep well last night.  States she had trouble going to sleep.  She had a lot of right shoulder pain.  States she used voltaren gel, ibuprofen, and ice which didn't help her pain.  She hasn't been wearing her CPAP.   States she has been having pain in her right buttock area.  States it is causing her to have trouble sitting.  She is also having left groin pain which makes her feel like she is going to fall.     Relevant past medical, surgical, family and social history reviewed and updated as indicated. Interim medical history since our last visit reviewed. Allergies and medications reviewed and updated.  Review of Systems  Eyes:  Negative for visual disturbance.  Respiratory:  Negative for cough, chest tightness and shortness of breath.   Cardiovascular:  Negative for chest pain, palpitations and leg swelling.  Musculoskeletal:        Hip pain and right shoulder pain  Neurological:  Negative for dizziness and headaches.    Per HPI unless specifically indicated above     Objective:    LMP  (LMP Unknown)   Wt Readings from Last 3  Encounters:  01/23/23 184 lb 9.6 oz (83.7 kg)  07/23/22 182 lb (82.6 kg)  12/20/21 192 lb 12.8 oz (87.5 kg)    Physical Exam Vitals and nursing note reviewed.  Constitutional:      General: She is not in acute distress.    Appearance: Normal appearance. She is normal weight. She is not ill-appearing, toxic-appearing or diaphoretic.  HENT:     Head: Normocephalic.     Right Ear: External ear normal.     Left Ear: External ear normal.     Nose: Nose normal.     Mouth/Throat:     Mouth: Mucous membranes are moist.     Pharynx: Oropharynx is clear.  Eyes:     General:        Right eye: No discharge.        Left eye: No discharge.     Extraocular Movements: Extraocular movements intact.     Conjunctiva/sclera: Conjunctivae normal.     Pupils: Pupils are equal, round, and reactive to light.  Cardiovascular:     Rate and Rhythm: Normal rate and regular rhythm.     Heart sounds: No murmur heard. Pulmonary:     Effort: Pulmonary effort is normal. No respiratory distress.     Breath sounds: Normal breath sounds. No wheezing or rales.  Musculoskeletal:     Right shoulder: Tenderness present. No swelling, deformity, effusion, laceration, bony tenderness or crepitus. Normal range of motion. Normal  strength. Normal pulse.     Cervical back: Normal range of motion and neck supple.     Right hip: No deformity, lacerations, tenderness, bony tenderness or crepitus. Normal range of motion. Normal strength.     Left hip: Tenderness present. No deformity, lacerations, bony tenderness or crepitus. Normal range of motion. Normal strength.  Skin:    General: Skin is warm and dry.     Capillary Refill: Capillary refill takes less than 2 seconds.  Neurological:     General: No focal deficit present.     Mental Status: She is alert and oriented to person, place, and time. Mental status is at baseline.  Psychiatric:        Mood and Affect: Mood normal.        Behavior: Behavior normal.         Thought Content: Thought content normal.        Judgment: Judgment normal.    Results for orders placed or performed in visit on 01/23/23  Microscopic Examination   Urine  Result Value Ref Range   WBC, UA 0-5 0 - 5 /hpf   RBC, Urine 0-2 0 - 2 /hpf   Epithelial Cells (non renal) 0-10 0 - 10 /hpf   Bacteria, UA None seen None seen/Few  CBC with Differential/Platelet  Result Value Ref Range   WBC 7.3 3.4 - 10.8 x10E3/uL   RBC 4.89 3.77 - 5.28 x10E6/uL   Hemoglobin 14.4 11.1 - 15.9 g/dL   Hematocrit 27.2 53.6 - 46.6 %   MCV 89 79 - 97 fL   MCH 29.4 26.6 - 33.0 pg   MCHC 33.3 31.5 - 35.7 g/dL   RDW 64.4 03.4 - 74.2 %   Platelets 293 150 - 450 x10E3/uL   Neutrophils 60 Not Estab. %   Lymphs 25 Not Estab. %   Monocytes 9 Not Estab. %   Eos 5 Not Estab. %   Basos 1 Not Estab. %   Neutrophils Absolute 4.3 1.4 - 7.0 x10E3/uL   Lymphocytes Absolute 1.8 0.7 - 3.1 x10E3/uL   Monocytes Absolute 0.7 0.1 - 0.9 x10E3/uL   EOS (ABSOLUTE) 0.4 0.0 - 0.4 x10E3/uL   Basophils Absolute 0.1 0.0 - 0.2 x10E3/uL   Immature Granulocytes 0 Not Estab. %   Immature Grans (Abs) 0.0 0.0 - 0.1 x10E3/uL  Comprehensive metabolic panel  Result Value Ref Range   Glucose 112 (H) 70 - 99 mg/dL   BUN 19 8 - 27 mg/dL   Creatinine, Ser 5.95 0.57 - 1.00 mg/dL   eGFR 61 >63 OV/FIE/3.32   BUN/Creatinine Ratio 19 12 - 28   Sodium 140 134 - 144 mmol/L   Potassium 4.3 3.5 - 5.2 mmol/L   Chloride 103 96 - 106 mmol/L   CO2 19 (L) 20 - 29 mmol/L   Calcium 9.6 8.7 - 10.3 mg/dL   Total Protein 6.9 6.0 - 8.5 g/dL   Albumin 4.5 3.8 - 4.8 g/dL   Globulin, Total 2.4 1.5 - 4.5 g/dL   Albumin/Globulin Ratio 1.9 1.2 - 2.2   Bilirubin Total 0.6 0.0 - 1.2 mg/dL   Alkaline Phosphatase 93 44 - 121 IU/L   AST 20 0 - 40 IU/L   ALT 21 0 - 32 IU/L  Lipid panel  Result Value Ref Range   Cholesterol, Total 251 (H) 100 - 199 mg/dL   Triglycerides 951 0 - 149 mg/dL   HDL 58 >88 mg/dL   VLDL Cholesterol Cal 25 5 - 40 mg/dL  LDL  Chol Calc (NIH) 168 (H) 0 - 99 mg/dL   Chol/HDL Ratio 4.3 0.0 - 4.4 ratio  TSH  Result Value Ref Range   TSH 2.330 0.450 - 4.500 uIU/mL  Urinalysis, Routine w reflex microscopic  Result Value Ref Range   Specific Gravity, UA 1.025 1.005 - 1.030   pH, UA 5.0 5.0 - 7.5   Color, UA Yellow Yellow   Appearance Ur Clear Clear   Leukocytes,UA 1+ (A) Negative   Protein,UA Negative Negative/Trace   Glucose, UA Negative Negative   Ketones, UA Negative Negative   RBC, UA Negative Negative   Bilirubin, UA Negative Negative   Urobilinogen, Ur 0.2 0.2 - 1.0 mg/dL   Nitrite, UA Negative Negative   Microscopic Examination See below:   HgB A1c  Result Value Ref Range   Hgb A1c MFr Bld 5.6 4.8 - 5.6 %   Est. average glucose Bld gHb Est-mCnc 114 mg/dL      Assessment & Plan:   Problem List Items Addressed This Visit       Other   Hyperlipidemia - Primary   Obesity (BMI 30-39.9)   Prediabetes     Follow up plan: No follow-ups on file.

## 2023-07-29 ENCOUNTER — Ambulatory Visit (INDEPENDENT_AMBULATORY_CARE_PROVIDER_SITE_OTHER): Payer: Medicare Other | Admitting: Nurse Practitioner

## 2023-07-29 ENCOUNTER — Encounter: Payer: Self-pay | Admitting: Nurse Practitioner

## 2023-07-29 VITALS — BP 124/77 | HR 62 | Temp 97.8°F | Ht 60.5 in | Wt 188.6 lb

## 2023-07-29 DIAGNOSIS — E7849 Other hyperlipidemia: Secondary | ICD-10-CM | POA: Diagnosis not present

## 2023-07-29 DIAGNOSIS — M722 Plantar fascial fibromatosis: Secondary | ICD-10-CM

## 2023-07-29 DIAGNOSIS — R7303 Prediabetes: Secondary | ICD-10-CM | POA: Diagnosis not present

## 2023-07-29 DIAGNOSIS — E669 Obesity, unspecified: Secondary | ICD-10-CM

## 2023-07-29 NOTE — Assessment & Plan Note (Signed)
Controlled.  Last A1c was 5.6%.  Continue without medication at this time.  Labs ordered at visit today.  Will make recommendations based on lab results.

## 2023-07-29 NOTE — Assessment & Plan Note (Addendum)
Chronic.  Controlled.  Continue with current medication regimen of Fenofibrate daily.  Intolerant to statin therapy.  Does not need refills today.  Labs ordered today.  Return to clinic in 6 months for reevaluation.  Call sooner if concerns arise.

## 2023-07-29 NOTE — Assessment & Plan Note (Signed)
Recommended eating smaller high protein, low fat meals more frequently and exercising 30 mins a day 5 times a week with a goal of 10-15lb weight loss in the next 3 months.  

## 2023-07-30 LAB — LIPID PANEL
Chol/HDL Ratio: 5.4 ratio — ABNORMAL HIGH (ref 0.0–4.4)
Cholesterol, Total: 288 mg/dL — ABNORMAL HIGH (ref 100–199)
HDL: 53 mg/dL (ref >39)
LDL Chol Calc (NIH): 194 mg/dL — ABNORMAL HIGH (ref 0–99)
Triglycerides: 213 mg/dL — ABNORMAL HIGH (ref 0–149)
VLDL Cholesterol Cal: 41 mg/dL — ABNORMAL HIGH (ref 5–40)

## 2023-07-30 LAB — COMPREHENSIVE METABOLIC PANEL
ALT: 18 [IU]/L (ref 0–32)
AST: 19 [IU]/L (ref 0–40)
Albumin: 4.6 g/dL (ref 3.8–4.8)
Alkaline Phosphatase: 91 [IU]/L (ref 44–121)
BUN/Creatinine Ratio: 18 (ref 12–28)
BUN: 17 mg/dL (ref 8–27)
Bilirubin Total: 0.5 mg/dL (ref 0.0–1.2)
CO2: 19 mmol/L — ABNORMAL LOW (ref 20–29)
Calcium: 9.8 mg/dL (ref 8.7–10.3)
Chloride: 105 mmol/L (ref 96–106)
Creatinine, Ser: 0.97 mg/dL (ref 0.57–1.00)
Globulin, Total: 2.2 g/dL (ref 1.5–4.5)
Glucose: 118 mg/dL — ABNORMAL HIGH (ref 70–99)
Potassium: 4.4 mmol/L (ref 3.5–5.2)
Sodium: 140 mmol/L (ref 134–144)
Total Protein: 6.8 g/dL (ref 6.0–8.5)
eGFR: 61 mL/min/{1.73_m2} (ref >59)

## 2023-07-30 LAB — HEMOGLOBIN A1C
Est. average glucose Bld gHb Est-mCnc: 120 mg/dL
Hgb A1c MFr Bld: 5.8 % — ABNORMAL HIGH (ref 4.8–5.6)

## 2023-12-16 DIAGNOSIS — M25511 Pain in right shoulder: Secondary | ICD-10-CM | POA: Diagnosis not present

## 2023-12-16 DIAGNOSIS — M19011 Primary osteoarthritis, right shoulder: Secondary | ICD-10-CM | POA: Diagnosis not present

## 2023-12-16 DIAGNOSIS — M7551 Bursitis of right shoulder: Secondary | ICD-10-CM | POA: Diagnosis not present

## 2023-12-16 DIAGNOSIS — M7581 Other shoulder lesions, right shoulder: Secondary | ICD-10-CM | POA: Diagnosis not present

## 2023-12-21 DIAGNOSIS — M7062 Trochanteric bursitis, left hip: Secondary | ICD-10-CM | POA: Diagnosis not present

## 2023-12-21 DIAGNOSIS — D68318 Other hemorrhagic disorder due to intrinsic circulating anticoagulants, antibodies, or inhibitors: Secondary | ICD-10-CM | POA: Diagnosis not present

## 2024-01-13 DIAGNOSIS — M25552 Pain in left hip: Secondary | ICD-10-CM | POA: Diagnosis not present

## 2024-01-13 DIAGNOSIS — M1612 Unilateral primary osteoarthritis, left hip: Secondary | ICD-10-CM | POA: Diagnosis not present

## 2024-01-23 ENCOUNTER — Other Ambulatory Visit: Payer: Self-pay | Admitting: Nurse Practitioner

## 2024-01-23 DIAGNOSIS — Z1231 Encounter for screening mammogram for malignant neoplasm of breast: Secondary | ICD-10-CM

## 2024-01-24 ENCOUNTER — Encounter: Payer: Self-pay | Admitting: Nurse Practitioner

## 2024-01-30 ENCOUNTER — Encounter (HOSPITAL_COMMUNITY): Payer: Self-pay

## 2024-02-05 DIAGNOSIS — M1612 Unilateral primary osteoarthritis, left hip: Secondary | ICD-10-CM | POA: Diagnosis not present

## 2024-02-06 ENCOUNTER — Ambulatory Visit
Admission: RE | Admit: 2024-02-06 | Discharge: 2024-02-06 | Disposition: A | Discharge status: Home / Self Care | Source: Ambulatory Visit | Attending: Nurse Practitioner | Admitting: Nurse Practitioner

## 2024-02-06 DIAGNOSIS — Z1231 Encounter for screening mammogram for malignant neoplasm of breast: Secondary | ICD-10-CM | POA: Insufficient documentation

## 2024-02-13 ENCOUNTER — Ambulatory Visit (INDEPENDENT_AMBULATORY_CARE_PROVIDER_SITE_OTHER): Admitting: Nurse Practitioner

## 2024-02-13 ENCOUNTER — Encounter: Payer: Self-pay | Admitting: Nurse Practitioner

## 2024-02-13 VITALS — BP 110/76 | HR 101 | Temp 98.1°F | Resp 18 | Ht 60.24 in | Wt 187.0 lb

## 2024-02-13 DIAGNOSIS — Z7189 Other specified counseling: Secondary | ICD-10-CM

## 2024-02-13 DIAGNOSIS — E669 Obesity, unspecified: Secondary | ICD-10-CM

## 2024-02-13 DIAGNOSIS — Z0000 Encounter for general adult medical examination without abnormal findings: Secondary | ICD-10-CM | POA: Diagnosis not present

## 2024-02-13 DIAGNOSIS — E7849 Other hyperlipidemia: Secondary | ICD-10-CM | POA: Diagnosis not present

## 2024-02-13 DIAGNOSIS — R7303 Prediabetes: Secondary | ICD-10-CM | POA: Diagnosis not present

## 2024-02-13 MED ORDER — CLOPIDOGREL BISULFATE 75 MG PO TABS: 75.0000 mg | ORAL_TABLET | Freq: Every day | ORAL | 1 refills | Status: DC

## 2024-02-13 MED ORDER — FENOFIBRATE 160 MG PO TABS: 160.0000 mg | ORAL_TABLET | Freq: Every day | ORAL | 1 refills | Status: DC

## 2024-02-13 NOTE — Progress Notes (Signed)
 BP 110/76 (BP Location: Right Arm, Patient Position: Sitting, Cuff Size: Large)   Pulse (!) 101   Temp 98.1 F (36.7 C) (Oral)   Resp 18   Ht 5' 0.24" (1.53 m)   Wt 187 lb (84.8 kg)   LMP  (LMP Unknown)   SpO2 97%   BMI 36.24 kg/m    Subjective:    Patient ID: Bethany Lara, female    DOB: [birth year], 73 y.o.   MRN: [id number]  HPI: Bethany Lara is a 73 y.o. female presenting on 02/13/2024 for comprehensive medical examination. Current medical complaints include:none  She currently lives with: Menopausal Symptoms: no  HYPERLIPIDEMIA Hyperlipidemia status: excellent compliance Satisfied with current treatment?  yes Side effects:  no Medication compliance: excellent compliance Past cholesterol meds: fenofibrate  (tricor ) Supplements: none Aspirin :  no The ASCVD Risk score (Arnett DK, et al., 2019) failed to calculate for the following reasons:   The patient has a prior MI or stroke diagnosis Chest pain:  no Coronary artery disease:  no Family history CAD:  no Family history early CAD:  no  Depression Screen done today and results listed below:     07/29/2023    8:14 AM 01/23/2023    8:16 AM 07/23/2022    8:14 AM 12/20/2021    8:36 AM 10/04/2021    1:39 PM  Depression screen PHQ 2/9  Decreased Interest 0 0 1 1 0  Down, Depressed, Hopeless 0 0 2 2 1   PHQ - 2 Score 0 0 3 3 1   Altered sleeping 0 2 3 2 3   Tired, decreased energy 0 0 2 1 1   Change in appetite 0 0 0 0 0  Feeling bad or failure about yourself  0 0 1 0 0  Trouble concentrating 0 0 1 0 0  Moving slowly or fidgety/restless 0 0 0 0 0  Suicidal thoughts 0 0 0 0 0  PHQ-9 Score 0 2 10 6 5   Difficult doing work/chores  Not difficult at all Somewhat difficult Not difficult at all Not difficult at all    The patient does not have a history of falls. I did complete a risk assessment for falls. A plan of care for falls was documented.   Past Medical History:  Past Medical History:  Diagnosis Date    Allergy    Arrhythmia    Carpal tunnel syndrome    Hyperlipidemia    Osteoarthritis of left hip    Swallowing dysfunction 10/26/2017    Surgical History:  Past Surgical History:  Procedure Laterality Date   BUNIONECTOMY     CARPAL TUNNEL RELEASE  11/14/2015   CARPAL TUNNEL RELEASE Right 06/17/2019   CHOLECYSTECTOMY     EYELID LACERATION REPAIR     Eye Lid Surgery   PEG PLACEMENT N/A 10/30/2017   Procedure: PERCUTANEOUS ENDOSCOPIC GASTROSTOMY (PEG) PLACEMENT;  Surgeon: Selena Daily, MD;  Location: [facility] ENDOSCOPY;  Service: Gastroenterology;  Laterality: N/A;    Medications:  Current Outpatient Medications on File Prior to Visit  Medication Sig   cetirizine  (ZYRTEC ) 10 MG tablet Take 1 tablet (10 mg total) by mouth daily.   CO ENZYME Q-10 PO Take by mouth daily.   diclofenac  Sodium (VOLTAREN ) 1 % GEL Apply 2 g topically 4 (four) times daily.   MAGNESIUM PO Take by mouth daily.   NON FORMULARY CPAP @@ bedtime.   No current facility-administered medications on file prior to visit.    Allergies:  Allergies  Allergen Reactions  Crestor  [Rosuvastatin  Calcium ] Other (See Comments)    myalgias   Zocor [Simvastatin] Other (See Comments)    Fatigue, legs achey     Social History:  Social History   Socioeconomic History   Marital status: Married    Spouse name: Not on file   Number of children: Not on file   Years of education: 12th grade   Highest education level: Not on file  Occupational History   Occupation: retired  Tobacco Use   Smoking status: Never   Smokeless tobacco: Never  Vaping Use   Vaping status: Never Used  Substance and Sexual Activity   Alcohol use: No    Alcohol/week: 0.0 standard drinks of alcohol   Drug use: No   Sexual activity: Not Currently  Other Topics Concern   Not on file  Social History Narrative   Not on file   Social Drivers of Health   Financial Resource Strain: Low Risk  (02/05/2024)   Received from [facility]   Overall Financial Resource Strain (CARDIA)    Difficulty of Paying Living Expenses: Not hard at all  Food Insecurity: No Food Insecurity (02/05/2024)   Received from [facility]   Hunger Vital Sign    Worried About Running Out of Food in the Last Year: Never true    Ran Out of Food in the Last Year: Never true  Transportation Needs: No Transportation Needs (02/05/2024)   Received from [facility]    In the past 12 months, has lack of transportation kept you from medical appointments or from getting medications?: No    Lack of Transportation (Non-Medical): No  Physical Activity: Inactive (08/09/2021)   Exercise Vital Sign    Days of Exercise per Week: 0 days    Minutes of Exercise per Session: 0 min  Stress: Stress Concern Present (08/09/2021)   [employer] of Occupational Health - Occupational Stress Questionnaire    Feeling of Stress : To some extent  Social Connections: Socially Integrated (08/09/2021)   Social Connection and Isolation Panel [NHANES]    Frequency of Communication with Friends and Family: More than three times a week    Frequency of Social Gatherings with Friends and Family: Twice a week    Attends Religious Services: More than 4 times per year    Active Member of [employer] or Organizations: Yes    Attends Banker Meetings: 1 to 4 times per year    Marital Status: Married  Catering manager Violence: Not At Risk (08/09/2021)   Humiliation, Afraid, Rape, and Kick questionnaire    Fear of Current or Ex-Partner: No    Emotionally Abused: No    Physically Abused: No    Sexually Abused: No   Social History   Tobacco Use  Smoking Status Never  Smokeless Tobacco Never   Social History   Substance and Sexual Activity  Alcohol Use No   Alcohol/week: 0.0 standard drinks of alcohol    Family History:  Family History  Problem Relation Age of Onset   Diabetes Mother     Hypertension Mother    Dementia Mother    Heart murmur Mother    Diabetes Father    Heart disease Father    Alzheimer's disease Father    Glaucoma Father    Colon cancer Father    Heart attack Father    Breast cancer Maternal Aunt     Past medical history, surgical history, medications,  allergies, family history and social history reviewed with patient today and changes made to appropriate areas of the chart.   Review of Systems  Eyes:  Negative for blurred vision and double vision.  Respiratory:  Negative for shortness of breath.   Cardiovascular:  Negative for chest pain, palpitations and leg swelling.  Neurological:  Negative for dizziness and headaches.   All other ROS negative except what is listed above and in the HPI.      Objective:     BP 110/76 (BP Location: Right Arm, Patient Position: Sitting, Cuff Size: Large)   Pulse (!) 101   Temp 98.1 F (36.7 C) (Oral)   Resp 18   Ht 5' 0.24" (1.53 m)   Wt 187 lb (84.8 kg)   LMP  (LMP Unknown)   SpO2 97%   BMI 36.24 kg/m   Wt Readings from Last 3 Encounters:  02/13/24 187 lb (84.8 kg)  07/29/23 188 lb 9.6 oz (85.5 kg)  01/23/23 184 lb 9.6 oz (83.7 kg)    Physical Exam Vitals and nursing note reviewed.  Constitutional:      General: She is awake. She is not in acute distress.    Appearance: Normal appearance. She is well-developed. She is obese. She is not ill-appearing.  HENT:     Head: Normocephalic and atraumatic.     Right Ear: Hearing, tympanic membrane, ear canal and external ear normal. No drainage.     Left Ear: Hearing, tympanic membrane, ear canal and external ear normal. No drainage.     Nose: Nose normal.     Right Sinus: No maxillary sinus tenderness or frontal sinus tenderness.     Left Sinus: No maxillary sinus tenderness or frontal sinus tenderness.     Mouth/Throat:     Mouth: Mucous membranes are moist.     Pharynx: Oropharynx is clear. Uvula midline. No pharyngeal swelling, oropharyngeal  exudate or posterior oropharyngeal erythema.  Eyes:     General: Lids are normal.        Right eye: No discharge.        Left eye: No discharge.     Extraocular Movements: Extraocular movements intact.     Conjunctiva/sclera: Conjunctivae normal.     Pupils: Pupils are equal, round, and reactive to light.     Visual Fields: Right eye visual fields normal and left eye visual fields normal.  Neck:     Thyroid : No thyromegaly.     Vascular: No carotid bruit.     Trachea: Trachea normal.  Cardiovascular:     Rate and Rhythm: Normal rate and regular rhythm.     Heart sounds: Normal heart sounds. No murmur heard.    No gallop.  Pulmonary:     Effort: Pulmonary effort is normal. No accessory muscle usage or respiratory distress.     Breath sounds: Normal breath sounds.  Chest:  Breasts:    Right: Normal.     Left: Normal.  Abdominal:     General: Bowel sounds are normal.     Palpations: Abdomen is soft. There is no hepatomegaly or splenomegaly.     Tenderness: There is no abdominal tenderness.  Musculoskeletal:        General: Normal range of motion.     Cervical back: Normal range of motion and neck supple.     Right lower leg: No edema.     Left lower leg: No edema.  Lymphadenopathy:     Head:     Right side of  head: No submental, submandibular, tonsillar, preauricular or posterior auricular adenopathy.     Left side of head: No submental, submandibular, tonsillar, preauricular or posterior auricular adenopathy.     Cervical: No cervical adenopathy.     Upper Body:     Right upper body: No supraclavicular, axillary or pectoral adenopathy.     Left upper body: No supraclavicular, axillary or pectoral adenopathy.  Skin:    General: Skin is warm and dry.     Capillary Refill: Capillary refill takes less than 2 seconds.     Findings: No rash.  Neurological:     Mental Status: She is alert and oriented to person, place, and time.     Gait: Gait is intact.  Psychiatric:         Attention and Perception: Attention normal.        Mood and Affect: Mood normal.        Speech: Speech normal.        Behavior: Behavior normal. Behavior is cooperative.        Thought Content: Thought content normal.        Judgment: Judgment normal.     Results for orders placed or performed in visit on 02/13/24  CBC with Differential/Platelet   Collection Time: 02/13/24  4:19 PM  Result Value Ref Range   WBC 11.2 (H) 3.4 - 10.8 x10E3/uL   RBC 5.18 3.77 - 5.28 x10E6/uL   Hemoglobin 15.3 11.1 - 15.9 g/dL   Hematocrit 16.1 09.6 - 46.6 %   MCV 89 79 - 97 fL   MCH 29.5 26.6 - 33.0 pg   MCHC 33.1 31.5 - 35.7 g/dL   RDW 04.5 40.9 - 81.1 %   Platelets 258 150 - 450 x10E3/uL   Neutrophils 60 Not Estab. %   Lymphs 27 Not Estab. %   Monocytes 8 Not Estab. %   Eos 4 Not Estab. %   Basos 1 Not Estab. %   Neutrophils Absolute 6.7 1.4 - 7.0 x10E3/uL   Lymphocytes Absolute 3.0 0.7 - 3.1 x10E3/uL   Monocytes Absolute 0.9 0.1 - 0.9 x10E3/uL   EOS (ABSOLUTE) 0.4 0.0 - 0.4 x10E3/uL   Basophils Absolute 0.1 0.0 - 0.2 x10E3/uL   Immature Granulocytes 0 Not Estab. %   Immature Grans (Abs) 0.0 0.0 - 0.1 x10E3/uL  Comprehensive metabolic panel with GFR   Collection Time: 02/13/24  4:19 PM  Result Value Ref Range   Glucose 98 70 - 99 mg/dL   BUN 13 8 - 27 mg/dL   Creatinine, Ser 9.14 0.57 - 1.00 mg/dL   eGFR 68 >78 GN/FAO/1.30   BUN/Creatinine Ratio 15 12 - 28   Sodium 139 134 - 144 mmol/L   Potassium 4.0 3.5 - 5.2 mmol/L   Chloride 102 96 - 106 mmol/L   CO2 22 20 - 29 mmol/L   Calcium  9.6 8.7 - 10.3 mg/dL   Total Protein 6.7 6.0 - 8.5 g/dL   Albumin 4.5 3.8 - 4.8 g/dL   Globulin, Total 2.2 1.5 - 4.5 g/dL   Bilirubin Total 0.6 0.0 - 1.2 mg/dL   Alkaline Phosphatase 104 44 - 121 IU/L   AST 20 0 - 40 IU/L   ALT 16 0 - 32 IU/L  Lipid panel   Collection Time: 02/13/24  4:19 PM  Result Value Ref Range   Cholesterol, Total 295 (H) 100 - 199 mg/dL   Triglycerides 865 (H) 0 - 149 mg/dL    HDL 50 >78 mg/dL  VLDL Cholesterol Cal 65 (H) 5 - 40 mg/dL   LDL Chol Calc (NIH) 782 (H) 0 - 99 mg/dL   LDL CALC COMMENT: Comment    Chol/HDL Ratio 5.9 (H) 0.0 - 4.4 ratio  TSH   Collection Time: 02/13/24  4:19 PM  Result Value Ref Range   TSH 1.230 0.450 - 4.500 uIU/mL  Hemoglobin A1c   Collection Time: 02/13/24  4:19 PM  Result Value Ref Range   Hgb A1c MFr Bld 5.9 (H) 4.8 - 5.6 %   Est. average glucose Bld gHb Est-mCnc 123 mg/dL      Assessment & Plan:   Problem List Items Addressed This Visit       Other   Hyperlipidemia   Chronic.  Controlled.  Continue with current medication regimen of Fenofibrate  daily.  Intolerant to statin therapy.  Refills sent today.  Labs ordered today.  Return to clinic in 6 months for reevaluation.  Call sooner if concerns arise.       Relevant Medications   fenofibrate  160 MG tablet   Other Relevant Orders   Lipid panel (Completed)   Obesity (BMI 30-39.9)   Recommended eating smaller high protein, low fat meals more frequently and exercising 30 mins a day 5 times a week with a goal of 10-15lb weight loss in the next 3 months.       Prediabetes   Labs ordered at visit today.  Will make recommendations based on lab results.        Relevant Orders   Hemoglobin A1c (Completed)   Advanced care planning/counseling discussion   A voluntary discussion about advance care planning including the explanation and discussion of advance directives was extensively discussed  with the patient for 5 minutes with patient.  Explanation about the health care proxy and Living will was reviewed and packet with forms with explanation of how to fill them out was given.  During this discussion, the patient stated she has an advance care plan and her husband is her medical power of attorney. She does not want to make changes at this time.      Other Visit Diagnoses       Encounter for Medicare annual wellness exam    -  Primary     Annual physical exam        Health maintenance reviewed during visit today.  Labs ordered.  Vaccines reviewed.   Relevant Orders   CBC with Differential/Platelet (Completed)   Comprehensive metabolic panel with GFR (Completed)   Lipid panel (Completed)   TSH (Completed)   Hemoglobin A1c (Completed)        Follow up plan: Return in 1 year (on 02/12/2025).   LABORATORY TESTING:  - Pap smear: not applicable  IMMUNIZATIONS:   - Tdap: Tetanus vaccination status reviewed: Medicare. - Influenza: Postponed to flu season - Pneumovax: Up to date - Prevnar: Up to date - COVID: Not applicable - HPV: Not applicable - Shingrix vaccine: Discussed at visit today  SCREENING: -Mammogram: Up to date  - Colonoscopy: Up to date  - Bone Density: Up to date  -Hearing Test: Not applicable  -Spirometry: Not applicable   PATIENT COUNSELING:   Advised to take 1 mg of folate supplement per day if capable of pregnancy.   Sexuality: Discussed sexually transmitted diseases, partner selection, use of condoms, avoidance of unintended pregnancy  and contraceptive alternatives.   Advised to avoid cigarette smoking.  I discussed with the patient that most people either abstain from alcohol or  drink within safe limits (<=14/week and <=4 drinks/occasion for males, <=7/weeks and <= 3 drinks/occasion for females) and that the risk for alcohol disorders and other health effects rises proportionally with the number of drinks per week and how often a drinker exceeds daily limits.  Discussed cessation/primary prevention of drug use and availability of treatment for abuse.   Diet: Encouraged to adjust caloric intake to maintain  or achieve ideal body weight, to reduce intake of dietary saturated fat and total fat, to limit sodium intake by avoiding high sodium foods and not adding table salt, and to maintain adequate dietary potassium and calcium  preferably from fresh fruits, vegetables, and low-fat dairy products.    stressed the importance  of regular exercise  Injury prevention: Discussed safety belts, safety helmets, smoke detector, smoking near bedding or upholstery.   Dental health: Discussed importance of regular tooth brushing, flossing, and dental visits.    NEXT PREVENTATIVE PHYSICAL DUE IN 1 YEAR. Return in 1 year (on 02/12/2025).

## 2024-02-14 ENCOUNTER — Ambulatory Visit: Payer: Self-pay | Admitting: Nurse Practitioner

## 2024-02-14 LAB — TSH: TSH: 1.23 u[IU]/mL (ref 0.450–4.500)

## 2024-02-14 LAB — CBC WITH DIFFERENTIAL/PLATELET
Basophils Absolute: 0.1 10*3/uL (ref 0.0–0.2)
Basos: 1 %
EOS (ABSOLUTE): 0.4 10*3/uL (ref 0.0–0.4)
Eos: 4 %
Hematocrit: 46.2 % (ref 34.0–46.6)
Hemoglobin: 15.3 g/dL (ref 11.1–15.9)
Immature Grans (Abs): 0 10*3/uL (ref 0.0–0.1)
Immature Granulocytes: 0 %
Lymphocytes Absolute: 3 10*3/uL (ref 0.7–3.1)
Lymphs: 27 %
MCH: 29.5 pg (ref 26.6–33.0)
MCHC: 33.1 g/dL (ref 31.5–35.7)
MCV: 89 fL (ref 79–97)
Monocytes Absolute: 0.9 10*3/uL (ref 0.1–0.9)
Monocytes: 8 %
Neutrophils Absolute: 6.7 10*3/uL (ref 1.4–7.0)
Neutrophils: 60 %
Platelets: 258 10*3/uL (ref 150–450)
RBC: 5.18 x10E6/uL (ref 3.77–5.28)
RDW: 12.7 % (ref 11.7–15.4)
WBC: 11.2 10*3/uL — ABNORMAL HIGH (ref 3.4–10.8)

## 2024-02-14 LAB — COMPREHENSIVE METABOLIC PANEL WITH GFR
ALT: 16 IU/L (ref 0–32)
AST: 20 IU/L (ref 0–40)
Albumin: 4.5 g/dL (ref 3.8–4.8)
Alkaline Phosphatase: 104 IU/L (ref 44–121)
BUN/Creatinine Ratio: 15 (ref 12–28)
BUN: 13 mg/dL (ref 8–27)
Bilirubin Total: 0.6 mg/dL (ref 0.0–1.2)
CO2: 22 mmol/L (ref 20–29)
Calcium: 9.6 mg/dL (ref 8.7–10.3)
Chloride: 102 mmol/L (ref 96–106)
Creatinine, Ser: 0.89 mg/dL (ref 0.57–1.00)
Globulin, Total: 2.2 g/dL (ref 1.5–4.5)
Glucose: 98 mg/dL (ref 70–99)
Potassium: 4 mmol/L (ref 3.5–5.2)
Sodium: 139 mmol/L (ref 134–144)
Total Protein: 6.7 g/dL (ref 6.0–8.5)
eGFR: 68 mL/min/{1.73_m2} (ref >59)

## 2024-02-14 LAB — LIPID PANEL
Chol/HDL Ratio: 5.9 ratio — ABNORMAL HIGH (ref 0.0–4.4)
Cholesterol, Total: 295 mg/dL — ABNORMAL HIGH (ref 100–199)
HDL: 50 mg/dL (ref >39)
LDL Chol Calc (NIH): 180 mg/dL — ABNORMAL HIGH (ref 0–99)
Triglycerides: 335 mg/dL — ABNORMAL HIGH (ref 0–149)
VLDL Cholesterol Cal: 65 mg/dL — ABNORMAL HIGH (ref 5–40)

## 2024-02-14 LAB — HEMOGLOBIN A1C
Est. average glucose Bld gHb Est-mCnc: 123 mg/dL
Hgb A1c MFr Bld: 5.9 % — ABNORMAL HIGH (ref 4.8–5.6)

## 2024-02-14 NOTE — Assessment & Plan Note (Signed)
 Labs ordered at visit today.  Will make recommendations based on lab results.

## 2024-02-14 NOTE — Assessment & Plan Note (Signed)
 Recommended eating smaller high protein, low fat meals more frequently and exercising 30 mins a day 5 times a week with a goal of 10-15lb weight loss in the next 3 months.

## 2024-02-14 NOTE — Assessment & Plan Note (Signed)
 A voluntary discussion about advance care planning including the explanation and discussion of advance directives was extensively discussed  with the patient for 5 minutes with patient.  Explanation about the health care proxy and Living will was reviewed and packet with forms with explanation of how to fill them out was given.  During this discussion, the patient stated she has an advance care plan and her husband is her medical power of attorney. She does not want to make changes at this time.

## 2024-02-14 NOTE — Assessment & Plan Note (Signed)
 Chronic.  Controlled.  Continue with current medication regimen of Fenofibrate  daily.  Intolerant to statin therapy.  Refills sent today.  Labs ordered today.  Return to clinic in 6 months for reevaluation.  Call sooner if concerns arise.

## 2024-02-14 NOTE — Progress Notes (Signed)
 Subjective:   Bethany Lara is a 73 y.o. female who presents for Medicare Annual (Subsequent) preventive examination.  Visit Complete: In person  Patient Medicare AWV questionnaire was completed by the patient on 02/13/2024; I have confirmed that all information answered by patient is correct and no changes since this date.  Cardiac Risk Factors include: advanced age (>4men, >64 women);obesity (BMI >30kg/m2)     Objective:     Today's Vitals   02/13/24 1602 02/13/24 1604  BP: 110/76   Pulse: (!) 101   Resp: 18   Temp: 98.1 F (36.7 C)   TempSrc: Oral   SpO2: 97%   Weight: 187 lb (84.8 kg)   Height: 5' 0.24" (1.53 m)   PainSc: 4  4   PainLoc: Hip    Body mass index is 36.24 kg/m.     02/13/2024    4:08 PM 08/09/2021   10:42 AM 08/08/2020    9:05 AM 07/21/2018   10:18 AM 10/30/2017    1:34 PM 10/26/2017    6:13 PM 10/26/2017   12:15 PM  Advanced Directives  Does Patient Have a Medical Advance Directive? Yes Yes Yes Yes  Yes Yes  Type of Sales promotion account executive of [employer] Power of [place];Living will Healthcare Power of [place];Living will Healthcare Power of [place];Living will Healthcare Power of [place];Living will   Does patient want to make changes to medical advance directive? Yes (ED - Information included in AVS)     No - Patient declined   Copy of Healthcare Power of Attorney in Chart? Yes - validated most recent copy scanned in chart (See row information) Yes - validated most recent copy scanned in chart (See row information) Yes - validated most recent copy scanned in chart (See row information) No - copy requested No - copy requested No - copy requested     Current Medications (verified) Outpatient Encounter Medications as of 02/13/2024  Medication Sig   cetirizine  (ZYRTEC ) 10 MG tablet Take 1 tablet (10 mg total) by mouth daily.   CO ENZYME Q-10 PO Take by mouth daily.   diclofenac  Sodium (VOLTAREN )  1 % GEL Apply 2 g topically 4 (four) times daily.   MAGNESIUM PO Take by mouth daily.   NON FORMULARY CPAP @@ bedtime.   [DISCONTINUED] clopidogrel  (PLAVIX ) 75 MG tablet TAKE 1 TABLET BY MOUTH DAILY   [DISCONTINUED] fenofibrate  160 MG tablet Take 1 tablet (160 mg total) by mouth daily.   [DISCONTINUED] Multiple Vitamin (MULTIVITAMIN) tablet Place 1 tablet into feeding tube daily. (Patient taking differently: Take 1 tablet by mouth daily.)   [DISCONTINUED] Vitamin D , Cholecalciferol , 1000 units TABS Give 2,000 Units by tube daily. (Patient taking differently: Take 2,000 Units by mouth daily.)   clopidogrel  (PLAVIX ) 75 MG tablet Take 1 tablet (75 mg total) by mouth daily.   fenofibrate  160 MG tablet Take 1 tablet (160 mg total) by mouth daily.   No facility-administered encounter medications on file as of 02/13/2024.    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/26/2017   Past Surgical History:  Procedure Laterality Date   BUNIONECTOMY     CARPAL TUNNEL RELEASE  11/14/2015   CARPAL TUNNEL RELEASE Right 06/17/2019   CHOLECYSTECTOMY  EYELID LACERATION REPAIR     Eye Lid Surgery   PEG PLACEMENT N/A 10/30/2017   Procedure: PERCUTANEOUS ENDOSCOPIC GASTROSTOMY (PEG) PLACEMENT;  Surgeon: Selena Daily, MD;  Location: [facility] ENDOSCOPY;  Service: Gastroenterology;  Laterality: N/A;   Family History  Problem Relation Age of Onset   Diabetes Mother    Hypertension Mother    Dementia Mother    Heart murmur Mother    Diabetes Father    Heart disease Father    Alzheimer's disease Father    Glaucoma Father    Colon cancer Father    Heart attack Father    Breast cancer Maternal Aunt    Social History   Socioeconomic History   Marital status: Married    Spouse name: Not  on file   Number of children: Not on file   Years of education: 12th grade   Highest education level: Not on file  Occupational History   Occupation: retired  Tobacco Use   Smoking status: Never   Smokeless tobacco: Never  Vaping Use   Vaping status: Never Used  Substance and Sexual Activity   Alcohol use: No    Alcohol/week: 0.0 standard drinks of alcohol   Drug use: No   Sexual activity: Not Currently  Other Topics Concern   Not on file  Social History Narrative   Not on file   Social Drivers of Health   Financial Resource Strain: Low Risk  (02/05/2024)   Received from [facility]   Overall Financial Resource Strain (CARDIA)    Difficulty of Paying Living Expenses: Not hard at all  Food Insecurity: No Food Insecurity (02/05/2024)   Received from [facility]   Hunger Vital Sign    Worried About Running Out of Food in the Last Year: Never true    Ran Out of Food in the Last Year: Never true  Transportation Needs: No Transportation Needs (02/05/2024)   Received from [facility] - Transportation    In the past 12 months, has lack of transportation kept you from medical appointments or from getting medications?: No    Lack of Transportation (Non-Medical): No  Physical Activity: Inactive (08/09/2021)   Exercise Vital Sign    Days of Exercise per Week: 0 days    Minutes of Exercise per Session: 0 min  Stress: Stress Concern Present (08/09/2021)   [employer] of Occupational Health - Occupational Stress Questionnaire    Feeling of Stress : To some extent  Social Connections: Socially Integrated (08/09/2021)   Social Connection and Isolation Panel [NHANES]    Frequency of Communication with Friends and Family: More than three times a week    Frequency of Social Gatherings with Friends and Family: Twice a week    Attends Religious Services: More than 4 times per year    Active Member of [employer] or Organizations:  Yes    Attends Banker Meetings: 1 to 4 times per year    Marital Status: Married    Tobacco Counseling Counseling given: Not Answered   Clinical Intake:  Pre-visit preparation completed: No  Pain : 0-10 Pain Score: 4  Pain Type: Chronic pain Pain Location: Hip Pain Orientation: Left Pain Descriptors / Indicators: Aching Pain Onset: More than a month ago Pain Frequency: Intermittent Pain Relieving Factors: Remaining active Effect of Pain on Daily Activities: Maintaining  Pain Relieving Factors: Remaining active  BMI - recorded: 36.24 Nutritional Status: BMI > 30  Obese Nutritional  Risks: None Diabetes: No  How often do you need to have someone help you when you read instructions, pamphlets, or other written materials from your doctor or pharmacy?: 1 - Never  Interpreter Needed?: No      Activities of Daily Living    02/13/2024    4:06 PM  In your present state of health, do you have any difficulty performing the following activities:  Hearing? 0  Vision? 1  Comment Glasses  Difficulty concentrating or making decisions? 0  Walking or climbing stairs? 1  Comment Due to hip pain  Dressing or bathing? 0  Doing errands, shopping? 0  Preparing Food and eating ? N  Using the Toilet? N  In the past six months, have you accidently leaked urine? N  Do you have problems with loss of bowel control? N  Managing your Medications? N  Managing your Finances? N  Housekeeping or managing your Housekeeping? N    Patient Care Team: Aileen Alexanders, NP as PCP - General Cindee Crazier, NP (Inactive) as PCP - Family Medicine (Nurse Practitioner) Elie Grove, LCSW as [facility] Care Management (Licensed Clinical Social Worker)  Indicate any recent Medical Services you may have received from other than [facility] providers in the past year (date may be approximate).     Assessment:    This is a routine wellness examination for  [place].  Hearing/Vision screen No results found.   Goals Addressed               This Visit's Progress     Increase water  intake   On track     Recommend drinking at least 7-8 glasses of water  a day       Patient Stated   On track     08/08/2020, wants to lose weight      Weight (lb) < 130 lb (59 kg) (pt-stated)   187 lb (84.8 kg)      Depression Screen    07/29/2023    8:14 AM 01/23/2023    8:16 AM 07/23/2022    8:14 AM 12/20/2021    8:36 AM 10/04/2021    1:39 PM 07/25/2021    2:17 PM 05/18/2021    8:39 AM  PHQ 2/9 Scores  PHQ - 2 Score 0 0 3 3 1 1  0  PHQ- 9 Score 0 2 10 6 5 5      Fall Risk    02/13/2024    4:09 PM 01/23/2023    8:16 AM 07/23/2022    8:14 AM 12/20/2021    8:36 AM 10/04/2021    1:39 PM  Fall Risk   Falls in the past year? 0 0 0 0 1  Number falls in past yr: 0 0 0 0 0  Injury with Fall? 0 0 0 0 0  Risk for fall due to : No Fall Risks;Orthopedic patient No Fall Risks No Fall Risks No Fall Risks No Fall Risks  Follow up Falls evaluation completed Falls evaluation completed Falls evaluation completed Falls evaluation completed Falls evaluation completed    MEDICARE RISK AT HOME: Medicare Risk at Home Any stairs in or around the home?: Yes If so, are there any without handrails?: Yes Home free of loose throw rugs in walkways, pet beds, electrical cords, etc?: Yes Adequate lighting in your home to reduce risk of falls?: Yes Life alert?: No Use of a cane, walker or w/c?: Yes Grab bars in the bathroom?: Yes Shower chair or bench in shower?: No Elevated  toilet seat or a handicapped toilet?: Yes  TIMED UP AND GO:  Was the test performed?  Yes  Length of time to ambulate 10 feet: 8 sec Gait steady and fast without use of assistive device    Cognitive Function:        02/13/2024    4:10 PM 01/23/2023    8:13 AM 08/08/2020    9:10 AM 07/21/2018   10:28 AM 07/17/2017    9:44 AM  6CIT Screen  What Year? 0 points 0 points 0 points 0 points 0 points   What month?  0 points 0 points 0 points 0 points  What time? 0 points 0 points 0 points 0 points 0 points  Count back from 20 0 points 0 points 0 points 0 points 0 points  Months in reverse 0 points 0 points 0 points 0 points 0 points  Repeat phrase 0 points 0 points 0 points 2 points 2 points  Total Score  0 points 0 points 2 points 2 points    Immunizations Immunization History  Administered Date(s) Administered   Influenza, High Dose Seasonal PF 07/23/2016   Influenza,inj,Quad PF,6+ Mos 11/28/2015   Moderna Sars-Covid-2 Vaccination 01/19/2020, 02/16/2020   Pneumococcal Conjugate-13 07/13/2014   Pneumococcal Polysaccharide-23 01/20/2016   Td 11/20/2005   Zoster, Live 05/09/2010    TDAP status: Due, Education has been provided regarding the importance of this vaccine. Advised may receive this vaccine at local pharmacy or Health Dept. Aware to provide a copy of the vaccination record if obtained from local pharmacy or Health Dept. Verbalized acceptance and understanding.  Flu Vaccine status: Up to date  Pneumococcal vaccine status: Up to date  Covid-19 vaccine status: Completed vaccines  Qualifies for Shingles Vaccine? Yes   Zostavax completed No   Shingrix Completed?: No.    Education has been provided regarding the importance of this vaccine. Patient has been advised to call insurance company to determine out of pocket expense if they have not yet received this vaccine. Advised may also receive vaccine at local pharmacy or Health Dept. Verbalized acceptance and understanding.  Screening Tests Health Maintenance  Topic Date Due   Zoster Vaccines- Shingrix (1 of 2) 05/16/1999   DTaP/Tdap/Td (2 - Tdap) 11/21/2015   COVID-19 Vaccine (3 - 2024-25 season) 05/26/2023   INFLUENZA VACCINE  04/24/2024   Fecal DNA (Cologuard)  01/03/2025   MAMMOGRAM  02/05/2025   Medicare Annual Wellness (AWV)  02/12/2025   Pneumonia Vaccine 61+ Years old  Completed   DEXA SCAN  Completed    Hepatitis C Screening  Completed   HPV VACCINES  Aged Out   Meningococcal B Vaccine  Aged Out    Health Maintenance  Health Maintenance Due  Topic Date Due   Zoster Vaccines- Shingrix (1 of 2) 05/16/1999   DTaP/Tdap/Td (2 - Tdap) 11/21/2015   COVID-19 Vaccine (3 - 2024-25 season) 05/26/2023    Colorectal cancer screening: Type of screening: Cologuard. Completed 01/03/2025. Repeat every 3 years  Mammogram status: Completed 02/06/2024. Repeat every year  Bone Density status: Completed 08/30/2014. Results reflect: Bone density results: NORMAL.   Lung Cancer Screening: (Low Dose CT Chest recommended if Age 46-80 years, 20 pack-year currently smoking OR have quit w/in 15years.) does not qualify.    Additional Screening:  Hepatitis C Screening: does qualify; Completed 07/22/2015  Vision Screening: Recommended annual ophthalmology exams for early detection of glaucoma and other disorders of the eye. Is the patient up to date with their annual eye exam?  Yes   Dental Screening: Recommended annual dental exams for proper oral hygiene   Community Resource Referral / Chronic Care Management: CRR required this visit?  No   CCM required this visit?  No     Plan:     I have personally reviewed and noted the following in the patient's chart:   Medical and social history Use of alcohol, tobacco or illicit drugs  Current medications and supplements including opioid prescriptions. Patient is not currently taking opioid prescriptions. Functional ability and status Nutritional status Physical activity Advanced directives List of other physicians Hospitalizations, surgeries, and ER visits in previous 12 months Vitals Screenings to include cognitive, depression, and falls Referrals and appointments  In addition, I have reviewed and discussed with patient certain preventive protocols, quality metrics, and best practice recommendations. A written personalized care plan for  preventive services as well as general preventive health recommendations were provided to patient.     Jilda Most Javari Bufkin, CMA   02/14/2024   After Visit Summary: (In Person-Printed) AVS printed and given to the patient

## 2024-03-10 DIAGNOSIS — M5416 Radiculopathy, lumbar region: Secondary | ICD-10-CM | POA: Diagnosis not present

## 2024-03-10 DIAGNOSIS — M1612 Unilateral primary osteoarthritis, left hip: Secondary | ICD-10-CM | POA: Diagnosis not present

## 2024-03-10 DIAGNOSIS — M25551 Pain in right hip: Secondary | ICD-10-CM | POA: Diagnosis not present

## 2024-03-12 ENCOUNTER — Ambulatory Visit

## 2024-03-20 DIAGNOSIS — M1612 Unilateral primary osteoarthritis, left hip: Secondary | ICD-10-CM | POA: Diagnosis not present

## 2024-03-25 DIAGNOSIS — M1612 Unilateral primary osteoarthritis, left hip: Secondary | ICD-10-CM | POA: Diagnosis not present

## 2024-03-31 DIAGNOSIS — M1612 Unilateral primary osteoarthritis, left hip: Secondary | ICD-10-CM | POA: Diagnosis not present

## 2024-04-08 DIAGNOSIS — M1612 Unilateral primary osteoarthritis, left hip: Secondary | ICD-10-CM | POA: Diagnosis not present

## 2024-04-14 DIAGNOSIS — M1612 Unilateral primary osteoarthritis, left hip: Secondary | ICD-10-CM | POA: Diagnosis not present

## 2024-04-28 ENCOUNTER — Ambulatory Visit

## 2024-04-29 DIAGNOSIS — M1612 Unilateral primary osteoarthritis, left hip: Secondary | ICD-10-CM | POA: Diagnosis not present

## 2024-05-07 ENCOUNTER — Encounter: Payer: Self-pay | Admitting: Nurse Practitioner

## 2024-05-13 DIAGNOSIS — M5416 Radiculopathy, lumbar region: Secondary | ICD-10-CM | POA: Diagnosis not present

## 2024-05-15 ENCOUNTER — Other Ambulatory Visit: Payer: Self-pay | Admitting: Orthopedic Surgery

## 2024-05-15 DIAGNOSIS — M5416 Radiculopathy, lumbar region: Secondary | ICD-10-CM

## 2024-05-16 ENCOUNTER — Ambulatory Visit
Admission: RE | Admit: 2024-05-16 | Discharge: 2024-05-16 | Disposition: A | Discharge status: Home / Self Care | Source: Ambulatory Visit | Attending: Orthopedic Surgery

## 2024-05-16 DIAGNOSIS — M5126 Other intervertebral disc displacement, lumbar region: Secondary | ICD-10-CM | POA: Diagnosis not present

## 2024-05-16 DIAGNOSIS — M5416 Radiculopathy, lumbar region: Secondary | ICD-10-CM | POA: Insufficient documentation

## 2024-05-16 DIAGNOSIS — M4316 Spondylolisthesis, lumbar region: Secondary | ICD-10-CM | POA: Diagnosis not present

## 2024-05-16 DIAGNOSIS — M48061 Spinal stenosis, lumbar region without neurogenic claudication: Secondary | ICD-10-CM | POA: Diagnosis not present

## 2024-05-16 DIAGNOSIS — N281 Cyst of kidney, acquired: Secondary | ICD-10-CM | POA: Diagnosis not present

## 2024-05-19 DIAGNOSIS — H40003 Preglaucoma, unspecified, bilateral: Secondary | ICD-10-CM | POA: Diagnosis not present

## 2024-05-19 DIAGNOSIS — H2513 Age-related nuclear cataract, bilateral: Secondary | ICD-10-CM | POA: Diagnosis not present

## 2024-05-19 DIAGNOSIS — H35371 Puckering of macula, right eye: Secondary | ICD-10-CM | POA: Diagnosis not present

## 2024-06-04 ENCOUNTER — Ambulatory Visit

## 2024-06-12 DIAGNOSIS — M5416 Radiculopathy, lumbar region: Secondary | ICD-10-CM | POA: Diagnosis not present

## 2024-06-18 NOTE — Progress Notes (Signed)
 Referring Physician:  Melvin Pao, NP [address] [place],  [place] [address]  Primary Physician:  Melvin Pao, NP  History of Present Illness: 06/23/2024 [name] is here today with a chief complaint of back pain with radiation to her right lower extremity.  She states the pain goes down the back of her leg and into the top of her foot and bottom of her heel.  She has intermittent tingling in her toes.  She has been having to use a cane intermittently as well as a walker due to significant pain and how it affects her ambulation.  She states that she cannot lay on her back and has to lean forward to help relieve some of her pain.  She is currently taking Tylenol  and tramadol .  Denies any saddle anesthesia.    Bowel/Bladder Dysfunction: none  Conservative measures:  Physical therapy: Has participated in @[facility] . Multimodal medical therapy including regular antiinflammatories:   Injections: Hip epidural steroid injections 01/2024.  Past Surgery:   AIDEEN FENSTER has no symptoms of cervical myelopathy.  The symptoms are causing a significant impact on the patient's life.   Review of Systems:  A 10 point review of systems is negative, except for the pertinent positives and negatives detailed in the HPI.  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/26/2017    Past Surgical History: Past Surgical History:  Procedure Laterality Date   BUNIONECTOMY     CARPAL TUNNEL RELEASE  11/14/2015   CARPAL TUNNEL RELEASE Right 06/17/2019   CHOLECYSTECTOMY     EYELID LACERATION REPAIR     Eye Lid Surgery   PEG PLACEMENT N/A 10/30/2017   Procedure: PERCUTANEOUS ENDOSCOPIC GASTROSTOMY (PEG) PLACEMENT;  Surgeon: Unk Corinn Skiff, MD;  Location: [facility] ENDOSCOPY;  Service: Gastroenterology;  Laterality: N/A;     Allergies: Allergies as of 06/23/2024 - Review Complete 06/23/2024  Allergen Reaction Noted   Crestor  [rosuvastatin  calcium ] Other (See Comments) 06/29/2019   Zocor [simvastatin] Other (See Comments) 07/15/2015    Medications: Outpatient Encounter Medications as of 06/23/2024  Medication Sig   cetirizine  (ZYRTEC ) 10 MG tablet Take 1 tablet (10 mg total) by mouth daily.   clopidogrel  (PLAVIX ) 75 MG tablet Take 1 tablet (75 mg total) by mouth daily.   CO ENZYME Q-10 PO Take by mouth daily.   cyclobenzaprine (FLEXERIL) 5 MG tablet Take 1 tablet (5 mg total) by mouth 3 (three) times daily as needed.   diclofenac  Sodium (VOLTAREN ) 1 % GEL Apply 2 g topically 4 (four) times daily.   fenofibrate  160 MG tablet Take 1 tablet (160 mg total) by mouth daily.   gabapentin (NEURONTIN) 100 MG capsule Take 1 capsule (100 mg total) by mouth 3 (three) times daily.   HYDROcodone -acetaminophen  (NORCO/VICODIN) 5-325 MG tablet Take 1 tablet by mouth 3 (three) times daily as needed for up to 3 days for moderate pain (pain score 4-6).   MAGNESIUM PO Take by mouth daily.   NON FORMULARY CPAP @@ bedtime.   predniSONE  (DELTASONE ) 20 MG tablet Take 2 tablets (40 mg total) by mouth daily with breakfast for 5 days.   traMADol  (ULTRAM ) 50 MG tablet Take 50 mg by mouth every 6 (six) hours as needed.   No facility-administered encounter medications on file as of 06/23/2024.    Social History: Social  History   Tobacco Use   Smoking status: Never   Smokeless tobacco: Never  Vaping Use   Vaping status: Never Used  Substance Use Topics   Alcohol use: No    Alcohol/week: 0.0 standard drinks of alcohol   Drug use: No    Family Medical History: Family History  Problem Relation Age of Onset   Diabetes Mother    Hypertension Mother    Dementia Mother    Heart murmur Mother    Diabetes Father    Heart disease Father    Alzheimer's disease Father    Glaucoma Father    Colon cancer Father    Heart attack  Father    Breast cancer Maternal Aunt     Physical Examination: @VITALWITHPAIN @  General: Patient is well developed, well nourished, calm, collected, and in no apparent distress. Attention to examination is appropriate.  Psychiatric: Patient is non-anxious.  Head:  Pupils equal, round, and reactive to light.  ENT:  Oral mucosa appears well hydrated.  Neck:   Supple.  Full range of motion.  Respiratory: Patient is breathing without any difficulty.  Extremities: No edema.  Vascular: Palpable dorsal pedal pulses.  Skin:   On exposed skin, there are no abnormal skin lesions.  NEUROLOGICAL:     Awake, alert, oriented to person, place, and time.  Speech is clear and fluent. Fund of knowledge is appropriate.   Cranial Nerves: Pupils equal round and reactive to light.  Facial tone is symmetric.   ROM of spine: Patient is not tender to palpation of her lumbar paraspinals.     Strength:  Side Iliopsoas Quads Hamstring PF DF EHL  R 5 5 4+ 4 5 5   L 5 5 5 5 5 5     Negative straight leg raise.  2+ patella, 1+ hamstring, trace achilles, reflex.  No clonus   Bilateral upper and lower extremity sensation is intact to light touch.    Patient is currently ambulating with a cane.   Medical Decision Making  Imaging: MRI lumbar spine:  IMPRESSION: 1. Shallow focal left foraminal disc protrusion at L3-4 without definite direct neural compression but possible irritation of the left L3 nerve root. 2. Broad-based left paracentral and foraminal disc protrusion at L4-5 contributing to mild left lateral recess stenosis and moderate foraminal stenosis contacting and displacing the left L4 nerve root. 3. Cystic process occupying the right neural foramen at L5-S1 making it difficult to visualize the actual right L5 nerve root. It has fluid-like attenuation most notably on the STIR sequence. It could represent a complex perineural cyst, schwannoma or possibly a dissecting synovial  cyst but is likely affecting the right L5 nerve root causing this patients right-sided radicular symptoms. I would recommend a repeat lumbar spine MRI with contrast to better evaluate this process.  I have personally reviewed the images and agree with the above interpretation.  Assessment and Plan: Ms. Robidoux is a pleasant 73 y.o. female with known compression on her right side at L5-S1 due to possible cyst versus schwannoma.  This is causing radiating pain down the back of her right leg into the top and bottom of her foot.  She is currently walking with a cane secondary to her pain.  It was a pleasure to see patient clinic today.  We discussed her case at length.  Plan for MRI of lumbar spine with without contrast to have better visualization of the area of compression determine cystic process versus schwannoma.  Will also get dynamic x-rays  of her lumbar spine for further evaluation.  Will review once complete.  We discussed her taking gabapentin and that helping her neuropathic pain.   Thank you for involving me in the care of this patient.   I spent a total of 45 minutes in both face-to-face and non-face-to-face activities for this visit on the date of this encounter including preparing to see the patient, obtaining and reviewing separately obtained history, performing medically appropriate examination, counseling the patient and their family, ordering additional medications and tests, documenting clinical information, independently interpreting results, coordination of care.   Lyle Decamp, PA-C Dept. of Neurosurgery

## 2024-06-20 ENCOUNTER — Emergency Department
Admission: EM | Admit: 2024-06-20 | Discharge: 2024-06-20 | Disposition: A | Discharge status: Home / Self Care | Attending: Emergency Medicine | Admitting: Emergency Medicine

## 2024-06-20 ENCOUNTER — Encounter: Payer: Self-pay | Admitting: *Deleted

## 2024-06-20 ENCOUNTER — Other Ambulatory Visit: Payer: Self-pay

## 2024-06-20 DIAGNOSIS — M5416 Radiculopathy, lumbar region: Secondary | ICD-10-CM | POA: Diagnosis not present

## 2024-06-20 MED ORDER — GABAPENTIN 100 MG PO CAPS: 100.0000 mg | ORAL_CAPSULE | Freq: Three times a day (TID) | ORAL | 1 refills | Status: DC

## 2024-06-20 MED ORDER — PREDNISONE 20 MG PO TABS: 40.0000 mg | ORAL_TABLET | Freq: Every day | ORAL | 0 refills | Status: AC

## 2024-06-20 MED ORDER — CYCLOBENZAPRINE HCL 5 MG PO TABS: 5.0000 mg | ORAL_TABLET | Freq: Three times a day (TID) | ORAL | 0 refills | Status: DC | PRN

## 2024-06-20 MED ORDER — DEXAMETHASONE SODIUM PHOSPHATE 10 MG/ML IJ SOLN
10.0000 mg | Freq: Once | INTRAMUSCULAR | Status: AC
  Administered 2024-06-20: 10 mg via INTRAMUSCULAR
  Filled 2024-06-20: qty 1

## 2024-06-20 MED ORDER — HYDROCODONE-ACETAMINOPHEN 5-325 MG PO TABS: 1.0000 | ORAL_TABLET | Freq: Three times a day (TID) | ORAL | 0 refills | Status: AC | PRN

## 2024-06-20 MED ORDER — HYDROCODONE-ACETAMINOPHEN 5-325 MG PO TABS
1.0000 | ORAL_TABLET | Freq: Once | ORAL | Status: AC
  Administered 2024-06-20: 1 via ORAL
  Filled 2024-06-20: qty 1

## 2024-06-20 MED ORDER — CYCLOBENZAPRINE HCL 10 MG PO TABS
10.0000 mg | ORAL_TABLET | Freq: Once | ORAL | Status: AC
  Administered 2024-06-20: 10 mg via ORAL
  Filled 2024-06-20: qty 1

## 2024-06-20 MED ORDER — GABAPENTIN 100 MG PO CAPS
100.0000 mg | ORAL_CAPSULE | Freq: Once | ORAL | Status: AC
  Administered 2024-06-20: 100 mg via ORAL
  Filled 2024-06-20: qty 1

## 2024-06-20 NOTE — Discharge Instructions (Addendum)
 Your symptoms are consistent with a spinal nerve irritation.  This radicular pain can cause numbness, tingling, and pain, and weakness in the lower extremity.  Take prescription meds as directed.  Follow-up with your primary provider and neurologist as scheduled.

## 2024-06-20 NOTE — ED Triage Notes (Signed)
 Pt c/o increased right sided lower back pain radiating down right leg with numbness in right foot. Reports having a bad left hip that has improved with PT and injection. Has been seeing Dr.Aberman for degenerative disc disease in L4, L5, S1.  has been referred to neursurgery  Been taking tramadol  and tylenol  without relief  Seen at [facility] a week ago and started on prednisone  and tramadol  as that time

## 2024-06-20 NOTE — ED Provider Notes (Signed)
 [facility] Emergency Department Provider Note     Event Date/Time   First MD Initiated Contact with Patient 06/20/24 2156     (approximate)   History   Back Pain   HPI  Bethany Lara is a 73 y.o. 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  She presents to the ED endorsing acute on chronic right-sided low back pain with right leg referral.  She describes numbness and tingling in the right foot.  Patient is currently under the care of Dr. Lorelle at [facility] and is being evaluated by neurology on Tuesday.  She has had a recent MRI which confirms diagnosis of radicular symptoms.  She denies any bladder or bowel incontinence, foot drop, saddle anesthesia.  No recent trauma or falls reported.  Physical Exam   Triage Vital Signs: ED Triage Vitals  Encounter Vitals Group     BP 06/20/24 2124 (!) 172/80     Girls Systolic BP Percentile --      Girls Diastolic BP Percentile --      Boys Systolic BP Percentile --      Boys Diastolic BP Percentile --      Pulse Rate 06/20/24 2124 (!) 57     Resp 06/20/24 2124 16     Temp 06/20/24 2124 97.8 F (36.6 C)     Temp src --      SpO2 06/20/24 2124 96 %     Weight --      Height --      Head Circumference --      Peak Flow --      Pain Score 06/20/24 2130 10     Pain Loc --      Pain Education --      Exclude from Growth Chart --     Most recent vital signs: Vitals:   06/20/24 2124  BP: (!) 172/80  Pulse: (!) 57  Resp: 16  Temp: 97.8 F (36.6 C)  SpO2: 96%    General Awake, no distress. NAD HEENT NCAT. PERRL. EOMI. No rhinorrhea. Mucous membranes are moist.  CV:  Good peripheral perfusion.  RESP:  Normal effort.  MSK:  AROM of all extremities NEURO: Cranial nerves II to XII grossly intact.  Normal LE DTRs bilaterally.  Normal toe dorsiflexion and foot eversion on exam.  Negative seated  straight leg raise bilaterally   ED Results / Procedures / Treatments   Labs (all labs ordered are listed, but only abnormal results are displayed) Labs Reviewed - No data to display   EKG   RADIOLOGY  I personally viewed and evaluated these images as part of my medical decision making, as well as reviewing the written report by the radiologist.  ED Provider Interpretation: Cystic process occupying the L5-S1 neuroforamen likely causing radicular symptoms.  Contrast MRI is recommended for further delineation  Lumbar MRI w/o CM (05/16/24)  IMPRESSION: 1. Shallow focal left foraminal disc protrusion at L3-4 without definite direct neural compression but possible irritation of the left L3 nerve root. 2. Broad-based left paracentral and foraminal disc protrusion at L4-5 contributing to mild left lateral recess stenosis and moderate foraminal stenosis contacting and displacing the left L4 nerve root. 3. Cystic process occupying the right neural foramen at L5-S1 making it difficult to visualize the actual right L5 nerve root. It has fluid-like attenuation most notably on the STIR sequence. It could represent  a complex perineural cyst, schwannoma or possibly a dissecting synovial cyst but is likely affecting the right L5 nerve root causing this patients right-sided radicular symptoms. I would recommend a repeat lumbar spine MRI with contrast to better evaluate this process.     Electronically Signed   By: MYRTIS Stammer M.D.   On: 05/27/2024 16:42  PROCEDURES:  Critical Care performed: No  Procedures   MEDICATIONS ORDERED IN ED: Medications  dexamethasone  (DECADRON ) injection 10 mg (10 mg Intramuscular Given 06/20/24 2223)  cyclobenzaprine (FLEXERIL) tablet 10 mg (10 mg Oral Given 06/20/24 2226)  gabapentin (NEURONTIN) capsule 100 mg (100 mg Oral Given 06/20/24 2226)     IMPRESSION / MDM / ASSESSMENT AND PLAN / ED COURSE  I reviewed the triage vital signs and the nursing  notes.                              Differential diagnosis includes, but is not limited to, lumbar radiculopathy, myalgia, sacroiliitis, sciatica  Patient's presentation is most consistent with acute, uncomplicated illness.  Patient's diagnosis is consistent with lumbar radiculopathy.  Patient presenting with acute on chronic pain due to her radicular symptoms on the right at the L5-S1 nerve root due to a cystic lesion.  No acute neurodeficits on exam.  No red flags noted.  Patient treated in the ED with cyclobenzaprine, gabapentin, and dexamethasone  IM.  Patient is stable for outpatient management as no red flags exist, no indication of any acute spinal cord compression or cauda equina syndrome.  Patient will be discharged home with prescriptions for Flexeril, Norco (#9), and prednisone . Patient is to follow up with neurology and Ortho as scheduled, as needed or otherwise directed. Patient is given ED precautions to return to the ED for any worsening or new symptoms.   FINAL CLINICAL IMPRESSION(S) / ED DIAGNOSES   Final diagnoses:  Lumbar radiculopathy     Rx / DC Orders   ED Discharge Orders          Ordered    cyclobenzaprine (FLEXERIL) 5 MG tablet  3 times daily PRN        06/20/24 2253    gabapentin (NEURONTIN) 100 MG capsule  3 times daily        06/20/24 2253    HYDROcodone -acetaminophen  (NORCO/VICODIN) 5-325 MG tablet  3 times daily PRN        06/20/24 2253    predniSONE  (DELTASONE ) 20 MG tablet  Daily with breakfast        06/20/24 2255             Note:  This document was prepared using Dragon voice recognition software and may include unintentional dictation errors.    Loyd Candida LULLA Aldona, PA-C 06/20/24 2315    Bradler, Evan K, MD 06/25/24 [phone]

## 2024-06-23 ENCOUNTER — Ambulatory Visit: Admitting: Physician Assistant

## 2024-06-23 ENCOUNTER — Encounter: Payer: Self-pay | Admitting: Physician Assistant

## 2024-06-23 VITALS — BP 122/78 | Ht 60.0 in | Wt 185.0 lb

## 2024-06-23 DIAGNOSIS — M792 Neuralgia and neuritis, unspecified: Secondary | ICD-10-CM | POA: Diagnosis not present

## 2024-06-23 DIAGNOSIS — R9389 Abnormal findings on diagnostic imaging of other specified body structures: Secondary | ICD-10-CM | POA: Diagnosis not present

## 2024-06-23 DIAGNOSIS — M48061 Spinal stenosis, lumbar region without neurogenic claudication: Secondary | ICD-10-CM

## 2024-06-23 DIAGNOSIS — G9520 Unspecified cord compression: Secondary | ICD-10-CM

## 2024-06-30 DIAGNOSIS — M1612 Unilateral primary osteoarthritis, left hip: Secondary | ICD-10-CM | POA: Diagnosis not present

## 2024-06-30 DIAGNOSIS — M25452 Effusion, left hip: Secondary | ICD-10-CM | POA: Diagnosis not present

## 2024-07-03 ENCOUNTER — Ambulatory Visit
Admission: RE | Admit: 2024-07-03 | Discharge: 2024-07-03 | Disposition: A | Discharge status: Home / Self Care | Source: Ambulatory Visit | Attending: Physician Assistant | Admitting: Physician Assistant

## 2024-07-03 DIAGNOSIS — M48061 Spinal stenosis, lumbar region without neurogenic claudication: Secondary | ICD-10-CM

## 2024-07-03 MED ORDER — GADOPICLENOL 0.5 MMOL/ML IV SOLN
10.0000 mL | Freq: Once | INTRAVENOUS | Status: AC | PRN
  Administered 2024-07-03: 10 mL via INTRAVENOUS

## 2024-07-15 NOTE — Progress Notes (Signed)
 Referring Physician:  Melvin Pao, NP [address] [place],  [place] [address]  Primary Physician:  Melvin Pao, NP  History of Present Illness: 07/20/2024 Bethany Lara is here today to discuss follow-up after her imaging demonstrated a right sided synovial cyst.  Fortunately her leg pain has improved significantly.  This has been on gabapentin.  She is not having any worsening of her lower extremity issues.  She feels like her leg pain and sciatica has improved significantly.  She does feel like she has had some back pain over the past week which is different than it once previously.  Feels like this is mostly across the low back and has had some difficulty in sleeping in certain positions.  She has never had any physical therapy for her low back.  She has never had any conservative care for her low back.   Past Surgery:   Bethany Lara has no symptoms of cervical myelopathy.  The symptoms are causing a significant impact on the patient's life.   Review of Systems:  A 10 point review of systems is negative, except for the pertinent positives and negatives detailed in the HPI.  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/26/2017    Past Surgical History: Past Surgical History:  Procedure Laterality Date   BUNIONECTOMY     CARPAL TUNNEL RELEASE  11/14/2015   CARPAL TUNNEL RELEASE Right 06/17/2019   CHOLECYSTECTOMY     EYELID LACERATION REPAIR     Eye Lid Surgery   PEG PLACEMENT N/A 10/30/2017   Procedure: PERCUTANEOUS ENDOSCOPIC GASTROSTOMY (PEG) PLACEMENT;  Surgeon: Unk Corinn Skiff, MD;  Location: [facility] ENDOSCOPY;  Service: Gastroenterology;  Laterality: N/A;    Allergies: Allergies as of 06/23/2024 - Review Complete 06/23/2024  Allergen Reaction Noted   Crestor  [rosuvastatin  calcium ] Other (See  Comments) 06/29/2019   Zocor [simvastatin] Other (See Comments) 07/15/2015    Medications: Outpatient Encounter Medications as of 06/23/2024  Medication Sig   cetirizine  (ZYRTEC ) 10 MG tablet Take 1 tablet (10 mg total) by mouth daily.   clopidogrel  (PLAVIX ) 75 MG tablet Take 1 tablet (75 mg total) by mouth daily.   CO ENZYME Q-10 PO Take by mouth daily.   cyclobenzaprine (FLEXERIL) 5 MG tablet Take 1 tablet (5 mg total) by mouth 3 (three) times daily as needed.   diclofenac  Sodium (VOLTAREN ) 1 % GEL Apply 2 g topically 4 (four) times daily.   fenofibrate  160 MG tablet Take 1 tablet (160 mg total) by mouth daily.   gabapentin (NEURONTIN) 100 MG capsule Take 1 capsule (100 mg total) by mouth 3 (three) times daily.   HYDROcodone -acetaminophen  (NORCO/VICODIN) 5-325 MG tablet Take 1 tablet by mouth 3 (three) times daily as needed for up to 3 days for moderate pain (pain score 4-6).   MAGNESIUM PO Take by mouth daily.   NON FORMULARY CPAP @@ bedtime.   predniSONE  (DELTASONE ) 20 MG tablet Take 2 tablets (40 mg total) by mouth daily with breakfast for 5 days.   traMADol  (ULTRAM ) 50 MG tablet Take 50 mg by mouth every 6 (six) hours as needed.   No facility-administered encounter medications on file as of 06/23/2024.    Social History: Social History   Tobacco Use   Smoking status: Never   Smokeless tobacco: Never  Vaping Use  Vaping status: Never Used  Substance Use Topics   Alcohol use: No    Alcohol/week: 0.0 standard drinks of alcohol   Drug use: No    Family Medical History: Family History  Problem Relation Age of Onset   Diabetes Mother    Hypertension Mother    Dementia Mother    Heart murmur Mother    Diabetes Father    Heart disease Father    Alzheimer's disease Father    Glaucoma Father    Colon cancer Father    Heart attack Father    Breast cancer Maternal Aunt     Physical Examination: NEUROLOGICAL:     Awake, alert, oriented to person, place, and time.   Speech is clear and fluent. Fund of knowledge is appropriate.   Cranial Nerves: Pupils equal round and reactive to light.  Facial tone is symmetric.   ROM of spine: Patient is not tender to palpation of her lumbar paraspinals.  Strength:  Side Iliopsoas Quads Hamstring PF DF EHL  R 5 5 4+ 4 5 5   L 5 5 5 5 5 5     Negative straight leg raise.  2+ patella,trace achilles, reflex.  No clonus    Medical Decision Making    Narrative & Impression  EXAM: MRI LUMBAR SPINE 07/03/2024 10:17:00 AM   TECHNIQUE: Multiplanar multisequence MRI of the lumbar spine was performed without and with the administration of intravenous contrast.   COMPARISON: Lumbar MRI 05/16/2024.   CLINICAL HISTORY: 73 year old female with lumbar radiculopathy, persistent symptoms, LBP radiating to L hip, and R foot numbness. Previous MRI 05/16/2024.   FINDINGS:   BONES AND ALIGNMENT: Lumbar segmentation appears to be normal (as reported in August). Normal alignment. Normal vertebral body heights. Bone marrow signal is unremarkable. No marrow edema. Intact visible sacrum and SI joints. Grade 1 anterolisthesis of L4 on L5 is stable. Subtle anterolisthesis at L5-S1.   SPINAL CORD: The conus terminates at T12-L1. No signal abnormality in the visible lower thoracic spinal cord or conus. Medullaris generally normal cauda equina nerve roots. Capacious spinal canal above L4-L5. No abnormality of intradural enhancement.   SOFT TISSUES: No paraspinal mass. Negative visible abdominal viscera; small simple and benign renal cysts (no follow up imaging recommended).   Levels above L4-L5 are Stable and normal for age.   L4-L5: Chronic grade 1 anterolisthesis. Mild circumferential disc bulge/pseudodisc. Moderate to severe ligamentum flavum hypertrophy. Moderate facet hypertrophy with degenerative facet joint fluid which has mildly increased. Mild epidural lipomatosis. Moderate multifactorial spinal stenosis.  Asymmetric mild to moderate left L4 neural foraminal stenosis appears stable.   L5-S1: Subtle anterolisthesis. Circumferential disc and endplate spurring, asymmetric to the right. Asymmetric moderate to severe facet arthropathy at this level appears to be severe on the right. Chronic degenerative facet joint fluid bilaterally. The space occupying mass within the right lateral recess and right neural foramen here persists, and does not solidly enhance, but rather intensively rim enhances (series 115 images 3 through 5) and also appears inseparable from the chronically degenerated right facet which demonstrates a similar enhancement pattern (series 200 images 32 and 33). As before, no significant spinal stenosis but moderate right lateral recess stenosis (descending right S1 nerve level) and severe right L5 neural foraminal stenosis. 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       IMPRESSION: 1. Right L5-S1 foraminal space-occupying mass persists, with intense rim enhancement only.  Notably, it is inseparable from the right L5-S1 facet - which demonstrates severe asymmetric degeneration, and similar postcontrast enhancement. Although not specific Bethany Lara an unusual and large degenerative synovial cyst dissecting into and through the right L5 neural foramen, causing severe foraminal stenosis, and moderate right lateral recess stenosis. 2. Stable lumbar spine otherwise. Grade 1 L4-L5 anterolisthesis and moderate multifactorial spinal stenosis. Stable asymmetric left L4 foraminal stenosis.   Electronically signed by: Helayne Hurst MD 07/07/2024 10:14 AM EDT RP    I have personally reviewed the images and agree with the above interpretation.  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  She also has listhesis  noted at L4-5 and L5-S1.  Will plan on getting flexion-extension films to evaluate for any hypermobility/instability.  Assessment and Plan: Bethany Lara is a pleasant 73 y.o. female with known compression on her right side at L5-S1 due to possible cyst versus schwannoma.  Repeat imaging more strongly suggest that this is likely a synovial cyst.  Thankfully she has had complete resolution of her radiating lower extremity pain and mostly just has back pain at this point.  She is currently on gabapentin and feels that it is helping with her leg pain but now with her back pain which is understandable.  At this point from a neuropathic standpoint she does not have any notable deficits and is not having any neuropathic pain at this point which would negate a necessity for surgical intervention.  However given her back pain and evidence of listhesis at 4 5 and 5 1 I would like to get flexion-extension films to evaluate whether or not she has any instability noted which might be the reason she formed a synovial cyst in the first place.  Should she have significant mobile listhesis she may require a surgical intervention including a lumbar fusion and decompression.  After reviewing her imaging if she does require surgery for treatment would favor a posterior approach so that a facetectomy could be performed in the setting of the severe foraminal stenosis.  Will follow-up after x-rays.  I have also asked that she start working with physical therapy as she has not had any formal physical therapy for her back and has all been for her hip.  Would like to continue to follow with her as she may need surgery in the future.   Penne MICAEL Sharps, MD  dept. of Neurosurgery   I spent a total of 30 minutes on her care today, this included reviewing her last 2 MRIs, evaluating previous therapy notes, evaluating her for her back pain, coordination of her care going forward, and documentation.

## 2024-07-20 ENCOUNTER — Ambulatory Visit: Admitting: Neurosurgery

## 2024-07-20 ENCOUNTER — Encounter: Payer: Self-pay | Admitting: Neurosurgery

## 2024-07-20 ENCOUNTER — Ambulatory Visit

## 2024-07-20 VITALS — BP 118/72 | Ht 60.0 in | Wt 185.0 lb

## 2024-07-20 DIAGNOSIS — M48061 Spinal stenosis, lumbar region without neurogenic claudication: Secondary | ICD-10-CM | POA: Diagnosis not present

## 2024-07-20 DIAGNOSIS — M7138 Other bursal cyst, other site: Secondary | ICD-10-CM | POA: Diagnosis not present

## 2024-07-20 DIAGNOSIS — M549 Dorsalgia, unspecified: Secondary | ICD-10-CM | POA: Diagnosis not present

## 2024-07-20 DIAGNOSIS — R9389 Abnormal findings on diagnostic imaging of other specified body structures: Secondary | ICD-10-CM | POA: Insufficient documentation

## 2024-08-03 ENCOUNTER — Telehealth: Payer: Self-pay | Admitting: Neurosurgery

## 2024-08-03 NOTE — Telephone Encounter (Signed)
 Patient reports worsening pain and is currently unable to walk or bend over comfortably. She is taking gabapentin but states it has been ineffective. Pain subsides a bit when laying down or sitting. Her physical therapy is scheduled for early December. I offered to schedule an appointment, but the patient requested that I first relay this information to Dr. Claudene to get his input before proceeding.

## 2024-08-04 ENCOUNTER — Other Ambulatory Visit: Payer: Self-pay | Admitting: Physician Assistant

## 2024-08-04 MED ORDER — TIZANIDINE HCL 4 MG PO TABS: 4.0000 mg | ORAL_TABLET | Freq: Three times a day (TID) | ORAL | 1 refills | Status: DC | PRN

## 2024-08-04 NOTE — Telephone Encounter (Addendum)
 Date: 08/04/24 Provider FRISBIE Med Requesting: Pt is taking Cyclobenzaprine 5mg  & she said it helps a bit and wanted to know if its still good to take for her condition. (If so, she is needing a refill) Or if you have something better or stronger for her, that would work as well.  Pharmacy: [facility] Patient contact: [phone]

## 2024-08-04 NOTE — Telephone Encounter (Signed)
 Patient states the Flexeril is helping with the pain only a little bit. This medication is the only thing she has tried and is wanting to know if she can try another muscle relaxer to see if it will work better.

## 2024-08-04 NOTE — Telephone Encounter (Signed)
 Patient notified Tizanidine was sent to pharmacy for her to try.

## 2024-08-06 ENCOUNTER — Telehealth: Payer: Self-pay | Admitting: Physician Assistant

## 2024-08-06 NOTE — Telephone Encounter (Signed)
 Date11/13/25 Provider FRISBIE Med Requesting Anything for pain that is appropriate for this pt Pharmacy [facility] in [place] Patient contact: [phone]

## 2024-08-06 NOTE — Telephone Encounter (Signed)
 Spoke with patient to get more details. Patient states in the last couple of days her right hip and her right leg felt more painful than it was during her LOV with Dr Claudene. She is not sure if she just over did it at home too soon. More back pain also. She took Tizanidine 1 tablet and that was too much and made her very dizzy and sleepy. So then she tried Tizanidine 1/2 tablet today (felt better ont hat dose), Gabapentin 100 mg -takes it 3 times daily, And Tylenol  8 hour strength 2 tablets and this combination seemed to help more this afternoon and pain better in the back, hip and leg. She does mention having a sudden numbness in the whole right leg down to her foot and right leg feeling heavy yesterday evening, she can not say how long this sensation lasted but it has resolved as of today. She has not had this feeling before that she recalls. No perianal numbness or incontinence present. No falls. She will give herself today and tonight to see if pain continues to improve with the regimen of medication listed above but did want to know if maybe she should increase her Gabapentin dose or the amount she is taking a day?

## 2024-08-06 NOTE — Telephone Encounter (Signed)
 Patient advised.

## 2024-08-19 ENCOUNTER — Ambulatory Visit: Admitting: Nurse Practitioner

## 2024-08-24 ENCOUNTER — Ambulatory Visit: Admitting: Nurse Practitioner

## 2024-08-24 ENCOUNTER — Telehealth: Admitting: Neurosurgery

## 2024-08-24 DIAGNOSIS — M7138 Other bursal cyst, other site: Secondary | ICD-10-CM

## 2024-08-24 MED ORDER — GABAPENTIN 100 MG PO CAPS: 100.0000 mg | ORAL_CAPSULE | Freq: Three times a day (TID) | ORAL | 1 refills | Status: DC

## 2024-08-24 NOTE — Telephone Encounter (Signed)
 Patient is calling to request a refill of Gabapentin  100mg  be sent to [facility] in [place]. She states that since Friday she has had difficulty walking and has had to use a cane. At times she rates her pain at 10/10. She would like to know if she needs to be seen sooner than 10/21/2024.

## 2024-08-26 ENCOUNTER — Other Ambulatory Visit: Payer: Self-pay | Admitting: Physician Assistant

## 2024-08-26 ENCOUNTER — Telehealth: Payer: Self-pay | Admitting: Neurosurgery

## 2024-08-26 DIAGNOSIS — Z9889 Other specified postprocedural states: Secondary | ICD-10-CM

## 2024-08-26 DIAGNOSIS — M7989 Other specified soft tissue disorders: Secondary | ICD-10-CM

## 2024-08-26 NOTE — Addendum Note (Signed)
 Addended by: GIRARD DON GAILS on: 08/26/2024 03:18 PM   Modules accepted: Orders

## 2024-08-26 NOTE — Telephone Encounter (Signed)
 Scheduled patient per her availability, for tomorrow 08/27/2024 at 10 am at [facility]-patient was not available today.

## 2024-08-26 NOTE — Telephone Encounter (Signed)
 Patient is calling back to let our office know that her legs are red and swollen and would like to proceed with getting an ultrasound.

## 2024-08-26 NOTE — Telephone Encounter (Signed)
 Patient is calling to let our office back that she is having swelling in both legs and ankles. She also states that she has a pinkish rash and states this has been going on for about a week now. She does not know how to send a picture through MyChart. Please advise.

## 2024-08-26 NOTE — Telephone Encounter (Signed)
 Spoke with patient, patient states her swelling is in her ankles. She noticed some swelling in the Right ankle about 3 weeks ago and then in the left ankle 1 week ago. She has noticed more of a pinkish color at night time in her calves of both legs and a little in the lower front leg areas but not swelling or itching. There is no warmth to the touch. Pain in legs is the same/chronic pain as she has been having. No new or different pain. She is taking Gabapentin  100 mg 1 every 8 hours, Tizanidine  4 mg every 8 hours, Tylenol  8 hour 1 every 8 hours. She wonders if the swelling can be from medication but she does not really want to stop any medication as when she does not take medications on this schedule she hurts significantly and can tell when the medications are wearing out of her system. I asked if patient spoke to her PCP about the swelling but she has not. She has appointment with PCP on 08/31/2024 and will mention it to them.  She has been elevating her legs a little but not enough she thinks, swelling in ankles stays the same overnight into the morning.

## 2024-08-26 NOTE — Addendum Note (Signed)
 Addended by: GIRARD DON GAILS on: 08/26/2024 03:07 PM   Modules accepted: Orders

## 2024-08-27 ENCOUNTER — Ambulatory Visit: Payer: Self-pay | Admitting: Physician Assistant

## 2024-08-27 ENCOUNTER — Ambulatory Visit
Admission: RE | Admit: 2024-08-27 | Discharge: 2024-08-27 | Attending: Physician Assistant | Admitting: Physician Assistant

## 2024-08-27 DIAGNOSIS — R238 Other skin changes: Secondary | ICD-10-CM | POA: Insufficient documentation

## 2024-08-27 DIAGNOSIS — M7989 Other specified soft tissue disorders: Secondary | ICD-10-CM | POA: Diagnosis present

## 2024-08-31 ENCOUNTER — Ambulatory Visit: Admitting: Nurse Practitioner

## 2024-09-01 ENCOUNTER — Ambulatory Visit: Admitting: Nurse Practitioner

## 2024-09-01 ENCOUNTER — Encounter: Payer: Self-pay | Admitting: Nurse Practitioner

## 2024-09-01 VITALS — BP 126/78 | HR 54 | Temp 97.5°F | Ht 60.0 in | Wt 187.6 lb

## 2024-09-01 DIAGNOSIS — M549 Dorsalgia, unspecified: Secondary | ICD-10-CM

## 2024-09-01 DIAGNOSIS — R7303 Prediabetes: Secondary | ICD-10-CM

## 2024-09-01 DIAGNOSIS — E7849 Other hyperlipidemia: Secondary | ICD-10-CM

## 2024-09-01 DIAGNOSIS — E669 Obesity, unspecified: Secondary | ICD-10-CM

## 2024-09-01 MED ORDER — CLOPIDOGREL BISULFATE 75 MG PO TABS: 75.0000 mg | ORAL_TABLET | Freq: Every day | ORAL | 1 refills | Status: AC

## 2024-09-01 MED ORDER — FENOFIBRATE 160 MG PO TABS: 160.0000 mg | ORAL_TABLET | Freq: Every day | ORAL | 1 refills | Status: DC

## 2024-09-01 NOTE — Assessment & Plan Note (Signed)
 Labs ordered at visit today.  Will make recommendations based on lab results.

## 2024-09-01 NOTE — Assessment & Plan Note (Signed)
 Recommended eating smaller high protein, low fat meals more frequently and exercising 30 mins a day 5 times a week with a goal of 10-15lb weight loss in the next 3 months.

## 2024-09-01 NOTE — Assessment & Plan Note (Signed)
 Chronic.  Ongoing.  Followed by Neurosurgery and Orthopedics.  Working with physical therapy to help manage pain.  She uses Tizanidine  and Gabapentin  to help with the pain.

## 2024-09-01 NOTE — Assessment & Plan Note (Signed)
 Chronic.  Controlled.  Continue with current medication regimen of Fenofibrate  daily.  Intolerant to statin therapy.  Refills sent today.  Labs ordered today.  Return to clinic in 6 months for reevaluation.  Call sooner if concerns arise.

## 2024-09-01 NOTE — Progress Notes (Unsigned)
 BP 126/78 (BP Location: Right Arm, Patient Position: Sitting)   Pulse (!) 54   Temp (!) 97.5 F (36.4 C) (Oral)   Ht 5' (1.524 m)   Wt 187 lb 10.1 oz (85.1 kg)   LMP  (LMP Unknown)   SpO2 98%   BMI 36.64 kg/m    Subjective:    Patient ID: Bethany Lara, female    DOB: August 06, 1951, 72 y.o.   MRN: [id number]  HPI: Bethany Lara is a 73 y.o. female  Chief Complaint  Patient presents with   office visit    6 month F/u. Patient stated she has had spinal synosis L5, L1, and she has to get total hip replacement on the left side. She is also having trouble sleeping due to constant pain.    HYPERLIPIDEMIA Hyperlipidemia status: excellent compliance Satisfied with current treatment?  no Side effects:  no Medication compliance: excellent compliance Past cholesterol meds: stopped taking pravastatin .  She is still taking the Fenofibrate . Supplements: none Aspirin :  plavix - history of CVA The ASCVD Risk score (Arnett DK, et al., 2019) failed to calculate for the following reasons:   Risk score cannot be calculated because patient has a medical history suggesting prior/existing ASCVD Chest pain:  no Coronary artery disease:  no Family history CAD:  no Family history early CAD:  no  Denies HA, CP, SOB, dizziness, palpitations, visual changes, and lower extremity swelling.   Patient states she has spinal stenosis and will have a total hip replacement on the left side.  She has had two injections in the left hip and that has helped her.  Her lower back is in worse pain now.  She still is not sleeping at night because of the pain.  She is taking Tylenol  and a muscle relaxer at night to help her sleep.  Gabapentin  caused her legs and ankles to swell so she is no longer taking it.     Relevant past medical, surgical, family and social history reviewed and updated as indicated. Interim medical history since our last visit reviewed. Allergies and medications reviewed and  updated.  Review of Systems  Eyes:  Negative for visual disturbance.  Respiratory:  Negative for cough, chest tightness and shortness of breath.   Cardiovascular:  Negative for chest pain, palpitations and leg swelling.  Musculoskeletal:        Plantar fascitis  Neurological:  Negative for dizziness and headaches.    Per HPI unless specifically indicated above     Objective:    BP 126/78 (BP Location: Right Arm, Patient Position: Sitting)   Pulse (!) 54   Temp (!) 97.5 F (36.4 C) (Oral)   Ht 5' (1.524 m)   Wt 187 lb 10.1 oz (85.1 kg)   LMP  (LMP Unknown)   SpO2 98%   BMI 36.64 kg/m   Wt Readings from Last 3 Encounters:  09/01/24 187 lb 10.1 oz (85.1 kg)  07/20/24 185 lb (83.9 kg)  06/23/24 185 lb (83.9 kg)    Physical Exam Vitals and nursing note reviewed.  Constitutional:      General: She is not in acute distress.    Appearance: Normal appearance. She is obese. She is not ill-appearing, toxic-appearing or diaphoretic.  HENT:     Head: Normocephalic.     Right Ear: External ear normal.     Left Ear: External ear normal.     Nose: Nose normal.     Mouth/Throat:     Mouth: Mucous membranes are  moist.     Pharynx: Oropharynx is clear.  Eyes:     General:        Right eye: No discharge.        Left eye: No discharge.     Extraocular Movements: Extraocular movements intact.     Conjunctiva/sclera: Conjunctivae normal.     Pupils: Pupils are equal, round, and reactive to light.  Cardiovascular:     Rate and Rhythm: Normal rate and regular rhythm.     Heart sounds: No murmur heard. Pulmonary:     Effort: Pulmonary effort is normal. No respiratory distress.     Breath sounds: Normal breath sounds. No wheezing or rales.  Musculoskeletal:     Right shoulder: Tenderness present. No swelling, deformity, effusion, laceration, bony tenderness or crepitus. Normal range of motion. Normal strength. Normal pulse.     Cervical back: Normal range of motion and neck supple.      Right hip: No deformity, lacerations, tenderness, bony tenderness or crepitus. Normal range of motion. Normal strength.     Left hip: Tenderness present. No deformity, lacerations, bony tenderness or crepitus. Normal range of motion. Normal strength.  Skin:    General: Skin is warm and dry.     Capillary Refill: Capillary refill takes less than 2 seconds.  Neurological:     General: No focal deficit present.     Mental Status: She is alert and oriented to person, place, and time. Mental status is at baseline.  Psychiatric:        Mood and Affect: Mood normal.        Behavior: Behavior normal.        Thought Content: Thought content normal.        Judgment: Judgment normal.     Results for orders placed or performed in visit on 02/13/24  CBC with Differential/Platelet   Collection Time: 02/13/24  4:19 PM  Result Value Ref Range   WBC 11.2 (H) 3.4 - 10.8 x10E3/uL   RBC 5.18 3.77 - 5.28 x10E6/uL   Hemoglobin 15.3 11.1 - 15.9 g/dL   Hematocrit 53.7 65.9 - 46.6 %   MCV 89 79 - 97 fL   MCH 29.5 26.6 - 33.0 pg   MCHC 33.1 31.5 - 35.7 g/dL   RDW 87.2 88.2 - 84.5 %   Platelets 258 150 - 450 x10E3/uL   Neutrophils 60 Not Estab. %   Lymphs 27 Not Estab. %   Monocytes 8 Not Estab. %   Eos 4 Not Estab. %   Basos 1 Not Estab. %   Neutrophils Absolute 6.7 1.4 - 7.0 x10E3/uL   Lymphocytes Absolute 3.0 0.7 - 3.1 x10E3/uL   Monocytes Absolute 0.9 0.1 - 0.9 x10E3/uL   EOS (ABSOLUTE) 0.4 0.0 - 0.4 x10E3/uL   Basophils Absolute 0.1 0.0 - 0.2 x10E3/uL   Immature Granulocytes 0 Not Estab. %   Immature Grans (Abs) 0.0 0.0 - 0.1 x10E3/uL  Comprehensive metabolic panel with GFR   Collection Time: 02/13/24  4:19 PM  Result Value Ref Range   Glucose 98 70 - 99 mg/dL   BUN 13 8 - 27 mg/dL   Creatinine, Ser 9.10 0.57 - 1.00 mg/dL   eGFR 68 >40 fO/fpw/8.26   BUN/Creatinine Ratio 15 12 - 28   Sodium 139 134 - 144 mmol/L   Potassium 4.0 3.5 - 5.2 mmol/L   Chloride 102 96 - 106 mmol/L   CO2 22 20  - 29 mmol/L   Calcium  9.6 8.7 - 10.3  mg/dL   Total Protein 6.7 6.0 - 8.5 g/dL   Albumin 4.5 3.8 - 4.8 g/dL   Globulin, Total 2.2 1.5 - 4.5 g/dL   Bilirubin Total 0.6 0.0 - 1.2 mg/dL   Alkaline Phosphatase 104 44 - 121 IU/L   AST 20 0 - 40 IU/L   ALT 16 0 - 32 IU/L  Lipid panel   Collection Time: 02/13/24  4:19 PM  Result Value Ref Range   Cholesterol, Total 295 (H) 100 - 199 mg/dL   Triglycerides 664 (H) 0 - 149 mg/dL   HDL 50 >60 mg/dL   VLDL Cholesterol Cal 65 (H) 5 - 40 mg/dL   LDL Chol Calc (NIH) 819 (H) 0 - 99 mg/dL   LDL CALC COMMENT: Comment    Chol/HDL Ratio 5.9 (H) 0.0 - 4.4 ratio  TSH   Collection Time: 02/13/24  4:19 PM  Result Value Ref Range   TSH 1.230 0.450 - 4.500 uIU/mL  Hemoglobin A1c   Collection Time: 02/13/24  4:19 PM  Result Value Ref Range   Hgb A1c MFr Bld 5.9 (H) 4.8 - 5.6 %   Est. average glucose Bld gHb Est-mCnc 123 mg/dL      Assessment & Plan:   Problem List Items Addressed This Visit       Other   Hyperlipidemia   Obesity (BMI 30-39.9)   Prediabetes - Primary     Follow up plan: No follow-ups on file.

## 2024-09-02 ENCOUNTER — Ambulatory Visit: Payer: Self-pay | Admitting: Nurse Practitioner

## 2024-09-02 LAB — LIPID PANEL
Chol/HDL Ratio: 7.2 ratio — ABNORMAL HIGH (ref 0.0–4.4)
Cholesterol, Total: 325 mg/dL — ABNORMAL HIGH (ref 100–199)
HDL: 45 mg/dL (ref >39)
LDL Chol Calc (NIH): 207 mg/dL — ABNORMAL HIGH (ref 0–99)
Triglycerides: 356 mg/dL — ABNORMAL HIGH (ref 0–149)
VLDL Cholesterol Cal: 73 mg/dL — ABNORMAL HIGH (ref 5–40)

## 2024-09-02 LAB — COMPREHENSIVE METABOLIC PANEL WITH GFR
ALT: 14 IU/L (ref 0–32)
AST: 15 IU/L (ref 0–40)
Albumin: 4.7 g/dL (ref 3.8–4.8)
Alkaline Phosphatase: 96 IU/L (ref 49–135)
BUN/Creatinine Ratio: 22 (ref 12–28)
BUN: 17 mg/dL (ref 8–27)
Bilirubin Total: 0.6 mg/dL (ref 0.0–1.2)
CO2: 21 mmol/L (ref 20–29)
Calcium: 9.8 mg/dL (ref 8.7–10.3)
Chloride: 102 mmol/L (ref 96–106)
Creatinine, Ser: 0.77 mg/dL (ref 0.57–1.00)
Globulin, Total: 2 g/dL (ref 1.5–4.5)
Glucose: 101 mg/dL — ABNORMAL HIGH (ref 70–99)
Potassium: 4 mmol/L (ref 3.5–5.2)
Sodium: 137 mmol/L (ref 134–144)
Total Protein: 6.7 g/dL (ref 6.0–8.5)
eGFR: 80 mL/min/1.73 (ref >59)

## 2024-09-02 LAB — HEMOGLOBIN A1C
Est. average glucose Bld gHb Est-mCnc: 114 mg/dL
Hgb A1c MFr Bld: 5.6 % (ref 4.8–5.6)

## 2024-09-03 ENCOUNTER — Telehealth: Payer: Self-pay | Admitting: Neurosurgery

## 2024-09-03 NOTE — Telephone Encounter (Signed)
 Patient called and states that her physical therapy is not helping and from time to time she is have a sharp pain in her lower back. She is requesting something for pain and would like to know what she needs to do. Please advise.

## 2024-09-04 ENCOUNTER — Other Ambulatory Visit: Payer: Self-pay | Admitting: Physician Assistant

## 2024-09-04 MED ORDER — TIZANIDINE HCL 4 MG PO TABS: 4.0000 mg | ORAL_TABLET | Freq: Three times a day (TID) | ORAL | 1 refills | Status: DC | PRN

## 2024-09-15 ENCOUNTER — Telehealth: Payer: Self-pay | Admitting: Physician Assistant

## 2024-09-15 ENCOUNTER — Other Ambulatory Visit: Payer: Self-pay | Admitting: Physician Assistant

## 2024-09-15 MED ORDER — TIZANIDINE HCL 4 MG PO TABS: 4.0000 mg | ORAL_TABLET | Freq: Three times a day (TID) | ORAL | 2 refills | Status: DC | PRN

## 2024-09-15 NOTE — Telephone Encounter (Signed)
 Patient was not available. Spoke to husband I let him know patient's medication was sent to pharmacy, verbalized understanding.

## 2024-09-15 NOTE — Telephone Encounter (Signed)
 Pt called into the office for a refill on tiZANidine  (ZANAFLEX ) 4 MG tablet  called into [facility] in [place].

## 2024-09-21 ENCOUNTER — Encounter: Payer: Self-pay | Admitting: Family Medicine

## 2024-09-21 ENCOUNTER — Ambulatory Visit: Payer: Self-pay

## 2024-09-21 ENCOUNTER — Ambulatory Visit: Admitting: Family Medicine

## 2024-09-21 VITALS — BP 126/60 | HR 69 | Ht 60.0 in | Wt 169.0 lb

## 2024-09-21 DIAGNOSIS — M4727 Other spondylosis with radiculopathy, lumbosacral region: Secondary | ICD-10-CM | POA: Diagnosis not present

## 2024-09-21 MED ORDER — PREGABALIN 25 MG PO CAPS: 25.0000 mg | ORAL_CAPSULE | Freq: Every evening | ORAL | 0 refills | Status: DC | PRN

## 2024-09-21 MED ORDER — PREDNISONE 20 MG PO TABS: 20.0000 mg | ORAL_TABLET | Freq: Two times a day (BID) | ORAL | 0 refills | Status: DC

## 2024-09-21 MED ORDER — CYCLOBENZAPRINE HCL 5 MG PO TABS: 5.0000 mg | ORAL_TABLET | Freq: Every evening | ORAL | 0 refills | Status: DC | PRN

## 2024-09-21 NOTE — Patient Instructions (Signed)
 VISIT SUMMARY:  During your visit, we discussed your ongoing issues with lower leg rash and swelling, worsening nighttime pain, chronic low back pain, lumbar radiculopathy, and left hip osteoarthritis. We have adjusted your medications and provided new prescriptions to help manage your symptoms.  YOUR PLAN:  LUMBAR RADICULOPATHY: Your lower extremity symptoms are due to irritation of the lumbar nerves, confirmed by MRI. Your symptoms have been worsening and are not responding to physical therapy. -Start a 7-day course of prednisone , taking 1 tablet twice daily with food. -Begin taking Lyrica  at a low dose nightly as needed for neuropathic pain. Try this for one week and report its effectiveness at your neurosurgery follow-up. -Monitor for any side effects from Lyrica  and adjust the timing if you feel groggy. -You have been prescribed cyclobenzaprine  5 mg, take 1-2 tablets at night as needed. -Continue taking tizanidine  in the morning and midday, and you can use Flexeril  at night. -Report your response to these medications to your neurosurgeon for further management, including possible dose adjustments or surgical options if symptoms persist. -Keep your upcoming neurosurgery appointment for further evaluation.   CHRONIC LOW BACK PAIN: Your chronic low back pain significantly impacts your sleep and daily function. It is associated with lumbar radiculopathy and hip osteoarthritis and has not improved with physical therapy. -Continue physical therapy as required by your insurance, but report any lack of benefit and worsening symptoms to your neurosurgeon and physical therapy team. -Use muscle relaxants (tizanidine  and cyclobenzaprine ) to help with pain and sleep. -Maintain your follow-up appointments with neurosurgery for ongoing assessment and to consider further interventions, including surgical options if conservative management does not help. -Monitor your symptoms and medication use, and report  any changes or concerns.

## 2024-09-21 NOTE — Assessment & Plan Note (Signed)
 History of Present Illness Bethany Lara is a 73 year old female with chronic low back pain, lumbar radiculopathy, and left hip osteoarthritis who presents with bilateral lower leg rash, swelling, and worsening nighttime pain.  Bilateral lower leg rash and swelling - Light pink rash and swelling of both lower legs for the past 2-3 weeks - Symptoms most pronounced at night, causing significant sleep disturbance - Pain, numbness, tightness, and occasional stinging sensations, especially when rubbing her hand down her legs - Elevation of legs sometimes increases pain - No new exposures, changes in routine, or recent outdoor activities prior to onset - Doppler ultrasound in early December negative for femoral vein thrombosis  Nocturnal pain and sleep disturbance - Worsening nighttime pain in lower legs and back - Pain keeps her awake at night - Significant sleep disturbance due to symptoms  Chronic low back pain and lumbar radiculopathy - Chronic low back pain prevents her from lying flat; requires sleeping in a recliner - Prolonged sitting exacerbates symptoms - Currently undergoing physical therapy for back with minimal benefit and occasional worsening of pain, especially at night  Left hip osteoarthritis - History of two left hip injections, each providing approximately three months of relief - Benefit from injections has now worn off  Medication use and response - No recent medication changes - Continues tizanidine  and clopidogrel  - Previously used gabapentin  100 mg at bedtime but hesitant to use regularly due to side effects (increased leg pain, swelling, drowsiness) - Has not tried cyclobenzaprine  - Uses tizanidine  as needed, particularly at night, and finds it helpful when taken consistently  Results Radiology Lower extremity venous Doppler ultrasound (08/27/2024): Normal femoral veins, no evidence of deep vein thrombosis, normal blood flow visualized. Lumbar spine MRI:  Findings consistent with nerve root irritation at the lumbar spine.  Physical Exam LUMBAR SPINE INSPECTION: Normal alignment without deformity or erythema. PALPATION: Mild lumbar paraspinal tenderness bilaterally, no focal bony tenderness.  RANGE OF MOTION: Flexion moderately limited by discomfort; extension and lateral bending elicit right greater than left lumbar pain. STRENGTH: 5/5 strength in bilateral hip flexion, knee extension, ankle dorsiflexion, and plantarflexion. NEUROLOGICAL: Sensation intact to light touch in bilateral lower extremities. No focal motor deficits. SPECIAL TESTS: Modified seated straight leg raise positive bilaterally, reproducing posterior thigh discomfort consistent with lumbar radiculopathy. Negative FABER and FADIR tests.  Assessment and Plan Lumbar radiculopathy Bilateral lower extremity symptoms due to lumbar nerve irritation, confirmed by MRI. Symptoms worsening, refractory to physical therapy. Gabapentin  avoided due to adverse effects. Awaiting neurosurgical evaluation. - Prescribed prednisone  7-day course, 1 tablet twice daily with food. - Initiated Lyrica  at low dose nightly as needed for neuropathic pain; trial for one week and report effectiveness at neurosurgery follow-up. - Provided guidance to monitor for Lyrica  side effects and adjust timing if grogginess occurs. - Prescribed cyclobenzaprine  5 mg, 1-2 tablets at night as needed. - Advised continuation of tizanidine  in the morning and midday, with option to use Flexeril  at night. - Instructed to report medication response to neurosurgery for further management, including possible dose adjustment or consideration of surgical intervention if symptoms persist. - Encouraged to maintain upcoming neurosurgery appointment for further evaluation.  Chronic low back pain Significantly impacts sleep and function, refractory to physical therapy, associated with lumbar radiculopathy and hip osteoarthritis. Under  neurosurgical evaluation. - Advised to continue physical therapy  - Prescribed muscle relaxants (tizanidine  and cyclobenzaprine ) to aid with pain and sleep. - Encouraged to maintain neurosurgery follow-up for ongoing assessment and consideration of  further interventions, including surgical options if conservative management fails. - Provided anticipatory guidance regarding medication use and symptom monitoring.

## 2024-09-21 NOTE — Progress Notes (Signed)
 "    Primary Care / Sports Medicine Office Visit  Patient Information:  Patient ID: Bethany Lara, female DOB: 05-Sep-1951 Age: 73 y.o. MRN: [id number]   Bethany Lara is a pleasant 73 y.o. female presenting with the following:  Chief Complaint  Patient presents with   Rash    Light red rash on lower legs with swelling of lower legs and ankles. Patient stated rash came up about 2-3 weeks ago. Rash does not itch but it feels like it stinging sometimes. Legs hurt more at night.     Vitals:   09/21/24 1430  BP: 126/60  Pulse: 69  SpO2: 98%   Vitals:   09/21/24 1430  Weight: 169 lb (76.7 kg)  Height: 5' (1.524 m)   Body mass index is 33.01 kg/m.   Discussed the use of AI scribe software for clinical note transcription with the patient, who gave verbal consent to proceed.   Independent interpretation of notes and tests performed by another provider:   None  Procedures performed:   None  Pertinent History, Exam, Impression, and Recommendations:   Problem List Items Addressed This Visit     Lumbosacral spondylosis with radiculopathy - Primary   History of Present Illness [name] is a 73 year old female with chronic low back pain, lumbar radiculopathy, and left hip osteoarthritis who presents with bilateral lower leg rash, swelling, and worsening nighttime pain.  Bilateral lower leg rash and swelling - Light pink rash and swelling of both lower legs for the past 2-3 weeks - Symptoms most pronounced at night, causing significant sleep disturbance - Pain, numbness, tightness, and occasional stinging sensations, especially when rubbing her hand down her legs - Elevation of legs sometimes increases pain - No new exposures, changes in routine, or recent outdoor activities prior to onset - Doppler ultrasound in early December negative for femoral vein thrombosis  Nocturnal pain and sleep disturbance - Worsening nighttime pain in lower legs and back - Pain  keeps her awake at night - Significant sleep disturbance due to symptoms  Chronic low back pain and lumbar radiculopathy - Chronic low back pain prevents her from lying flat; requires sleeping in a recliner - Prolonged sitting exacerbates symptoms - Currently undergoing physical therapy for back with minimal benefit and occasional worsening of pain, especially at night  Left hip osteoarthritis - History of two left hip injections, each providing approximately three months of relief - Benefit from injections has now worn off  Medication use and response - No recent medication changes - Continues tizanidine  and clopidogrel  - Previously used gabapentin  100 mg at bedtime but hesitant to use regularly due to side effects (increased leg pain, swelling, drowsiness) - Has not tried cyclobenzaprine  - Uses tizanidine  as needed, particularly at night, and finds it helpful when taken consistently  Results Radiology Lower extremity venous Doppler ultrasound (08/27/2024): Normal femoral veins, no evidence of deep vein thrombosis, normal blood flow visualized. Lumbar spine MRI: Findings consistent with nerve root irritation at the lumbar spine.  Physical Exam LUMBAR SPINE INSPECTION: Normal alignment without deformity or erythema. PALPATION: Mild lumbar paraspinal tenderness bilaterally, no focal bony tenderness.  RANGE OF MOTION: Flexion moderately limited by discomfort; extension and lateral bending elicit right greater than left lumbar pain. STRENGTH: 5/5 strength in bilateral hip flexion, knee extension, ankle dorsiflexion, and plantarflexion. NEUROLOGICAL: Sensation intact to light touch in bilateral lower extremities. No focal motor deficits. SPECIAL TESTS: Modified seated straight leg raise positive bilaterally, reproducing posterior thigh discomfort  consistent with lumbar radiculopathy. Negative FABER and FADIR tests.  Assessment and Plan Lumbar radiculopathy Bilateral lower extremity  symptoms due to lumbar nerve irritation, confirmed by MRI. Symptoms worsening, refractory to physical therapy. Gabapentin  avoided due to adverse effects. Awaiting neurosurgical evaluation. - Prescribed prednisone  7-day course, 1 tablet twice daily with food. - Initiated Lyrica  at low dose nightly as needed for neuropathic pain; trial for one week and report effectiveness at neurosurgery follow-up. - Provided guidance to monitor for Lyrica  side effects and adjust timing if grogginess occurs. - Prescribed cyclobenzaprine  5 mg, 1-2 tablets at night as needed. - Advised continuation of tizanidine  in the morning and midday, with option to use Flexeril  at night. - Instructed to report medication response to neurosurgery for further management, including possible dose adjustment or consideration of surgical intervention if symptoms persist. - Encouraged to maintain upcoming neurosurgery appointment for further evaluation.  Chronic low back pain Significantly impacts sleep and function, refractory to physical therapy, associated with lumbar radiculopathy and hip osteoarthritis. Under neurosurgical evaluation. - Advised to continue physical therapy  - Prescribed muscle relaxants (tizanidine  and cyclobenzaprine ) to aid with pain and sleep. - Encouraged to maintain neurosurgery follow-up for ongoing assessment and consideration of further interventions, including surgical options if conservative management fails. - Provided anticipatory guidance regarding medication use and symptom monitoring.      Relevant Medications   predniSONE  (DELTASONE ) 20 MG tablet   pregabalin  (LYRICA ) 25 MG capsule   cyclobenzaprine  (FLEXERIL ) 5 MG tablet     Orders & Medications Medications:  Meds ordered this encounter  Medications   predniSONE  (DELTASONE ) 20 MG tablet    Sig: Take 1 tablet (20 mg total) by mouth 2 (two) times daily with a meal for 7 days.    Dispense:  14 tablet    Refill:  0   pregabalin  (LYRICA )  25 MG capsule    Sig: Take 1 capsule (25 mg total) by mouth at bedtime as needed.    Dispense:  30 capsule    Refill:  0   cyclobenzaprine  (FLEXERIL ) 5 MG tablet    Sig: Take 1-2 tablets (5-10 mg total) by mouth at bedtime as needed for muscle spasms.    Dispense:  30 tablet    Refill:  0   No orders of the defined types were placed in this encounter.    No follow-ups on file.     Selinda JINNY Ku, MD, [facility]   Primary Care Sports Medicine Primary Care and Sports Medicine at [facility]   "

## 2024-09-21 NOTE — Telephone Encounter (Signed)
 FYI Only or Action Required?: FYI only for provider: appointment scheduled on 09/21/2024 at alternative office location .  Patient was last seen in primary care on 09/01/2024 by Melvin Pao, NP.  Called Nurse Triage reporting Leg Pain and Leg Swelling.  Symptoms began several weeks ago.  Interventions attempted: Other: Heating pad, ice packs on legs, elevation .  Symptoms are: gradually worsening.  Triage Disposition: See Physician Within 24 Hours (overriding See HCP Within 4 Hours (Or PCP Triage)) patient booked today at 2:20 advised UC/ER if worsening in meantime,   Patient/caregiver understands and will follow disposition?: Yes                            Reason for Disposition  [1] Red area or streak [2] large (> 2 inches or 5 cm)  Answer Assessment - Initial Assessment Questions Patient reports swelling and redness pain started few weeks ago this is new had imaging for ultrasound for legs done earlier this month neurosurgeons office offered was told no blood clot. Does takes blood thinner clopidogrel . Seen 12/9 with PCP. Reports the swelling, redness and pain had gotten worse since imaging. Reports the legs are casing issues with sleeping at night. RN advised appointment today patient agreeable no openings with PCP office today booked alternative office location today at 2:20 PM pt provided with office location/address. Patient will see UC/ER in meaning if worsening symptoms     1. ONSET: When did the swelling start? (e.g., minutes, hours, days)     Few weeks ago , had US  a few weeks ago was told no blood clot takes plavix  2. LOCATION: What part of the leg is swollen?  Are both legs swollen or just one leg?     Both legs to feet, swelling is worse right leg 3. SEVERITY: How bad is the swelling? (e.g., localized; mild, moderate, severe)     Severe  4. REDNESS: Is there redness or signs of infection?     Legs and feet are red, ankles 5. PAIN:  Is the swelling painful to touch? If Yes, ask: How painful is it?   (Scale 1-10; mild, moderate or severe)     2/10 currently ,but night pain is more noticeable can't sleep  6. FEVER: Do you have a fever? If Yes, ask: What is it, how was it measured, and when did it start?      Denies  7. CAUSE: What do you think is causing the leg swelling?     Not sure if medication related but also has spinal stenosis per pt , and seeing a provider for this Dr claudene neurosurgeon  8. MEDICAL HISTORY: Do you have a history of blood clots (e.g., DVT), cancer, heart failure, kidney disease, or liver failure?     Denies DVT , on blood thinners 9. RECURRENT SYMPTOM: Have you had leg swelling before? If Yes, ask: When was the last time? What happened that time?     No this is new  10. OTHER SYMPTOMS: Do you have any other symptoms? (e.g., chest pain, difficulty breathing)       Patient denies the following chest pain, difficulty breathing, hemoptysis  Protocols used: Leg Swelling and Edema-A-AH Copied from CRM #[id number]. Topic: Clinical - Red Word Triage >> Sep 21, 2024  8:34 AM Victoria B wrote: [facility] that prompted transfer to Nurse Triage: patient has severe pain and swelling in legs and feet >> Sep 21, 2024  8:43 AM  Victoria B wrote: Patient has redness also

## 2024-09-28 ENCOUNTER — Telehealth: Payer: Self-pay

## 2024-09-28 NOTE — Telephone Encounter (Signed)
 Spoke to patient this morning regarding physical therapy. She is going to [facility] on [address] in [place]. She has 2 more visits left. She is in a lot of pain, PT has not helped feels it makes the pain worse.

## 2024-09-30 ENCOUNTER — Other Ambulatory Visit: Payer: Self-pay

## 2024-09-30 ENCOUNTER — Ambulatory Visit: Payer: Self-pay | Admitting: Neurosurgery

## 2024-09-30 ENCOUNTER — Ambulatory Visit: Admitting: Physician Assistant

## 2024-09-30 VITALS — BP 160/90 | Wt 169.0 lb

## 2024-09-30 DIAGNOSIS — M5417 Radiculopathy, lumbosacral region: Secondary | ICD-10-CM | POA: Insufficient documentation

## 2024-09-30 DIAGNOSIS — M7138 Other bursal cyst, other site: Secondary | ICD-10-CM

## 2024-09-30 DIAGNOSIS — R9389 Abnormal findings on diagnostic imaging of other specified body structures: Secondary | ICD-10-CM | POA: Diagnosis not present

## 2024-09-30 DIAGNOSIS — Z01818 Encounter for other preprocedural examination: Secondary | ICD-10-CM

## 2024-09-30 MED ORDER — ACETAMINOPHEN 500 MG PO TABS: 1000.0000 mg | ORAL_TABLET | Freq: Four times a day (QID) | ORAL | 2 refills | Status: AC | PRN

## 2024-09-30 MED ORDER — OXYCODONE HCL 5 MG PO TABS: 2.5000 mg | ORAL_TABLET | Freq: Four times a day (QID) | ORAL | 0 refills | Status: DC | PRN

## 2024-09-30 NOTE — Patient Instructions (Signed)
 Please see below for information in regards to your upcoming surgery:   Planned surgery: Right L5-S1 Hemilaminectomy and medial facetectomy for Synovial Cyst Resection    Surgery date: 10/20/24 at [facility] (Medical Mall: [address] [place], [place] [address]) - you will find out your arrival time the business day before your surgery.   Pre-op appointment at [facility] Pre-admit Testing: you will receive a call with a date/time for this appointment. If you are scheduled for an in person appointment, Pre-admit Testing is located on the first floor of the [facility] building, [address], [address]. During this appointment, they will advise you which medications you can take the morning of surgery, and which medications you will need to hold for surgery. Labs (such as blood work, EKG) may be done at your pre-op appointment. You are not required to fast for these labs. Should you need to change your pre-op appointment, please call Pre-admit testing at [phone].   Please bring your medication bottles or an up to date medication list to your pre-admit testing appointment (regardless of whether we have a list in your chart).   Blood thinners:   Plavix  (clopidogrel ): Stop Plavix  7 days prior, resume Plavix  14 days after      Surgical clearance: we will send a clearance form to Darice Petty, NP. They may wish to see you in their office prior to signing the clearance form. If so, they may call you to schedule an appointment.    Common restrictions after spine surgery: No bending, lifting, or twisting (BLT). Avoid lifting objects heavier than 10 pounds for the first 6 weeks after surgery. Where possible, avoid household activities that involve lifting, bending, reaching, pushing, or pulling such as laundry, vacuuming, grocery shopping, and childcare. Try to arrange for help from friends and family for these activities while you heal. Do not  drive while taking prescription pain medication. Weeks 6 through 12 after surgery: avoid lifting more than 25 pounds.     How to contact us :  If you have any questions/concerns before or after surgery, you can reach us  at [phone], or you can send a mychart message. We can be reached by phone or mychart 8am-4pm, Monday-Friday.  *Please note: Calls after 4pm are forwarded to a third party answering service. Mychart messages are not routinely monitored during evenings, weekends, and holidays. Please call our office to contact the answering service for urgent concerns during non-business hours.    If you have FMLA/disability paperwork, please drop it off or fax it to [phone]   Appointments/FMLA & disability paperwork: Reche Hait, & Nichole Registered Nurse/Surgery scheduler: Krystopher Kuenzel, RN & Katie, RN Certified Medical Assistants: Don, CMA, Elenor, CMA, Damien, CMA, & Auston, [place] Physician Assistants: Lyle Decamp, PA-C, Edsel Goods, PA-C & Glade Boys, PA-C Surgeons: Penne Sharps, MD & Reeves Daisy, MD   [facility] PREADMIT TESTING VISIT and SURGERY INFORMATION SHEET   Now that surgery has been scheduled you can anticipate several phone calls from [facility] services. A pharmacy technician will call you to verify your current list of medications taken at home.               The Pre-Service Center will call to verify your insurance information and to give you billing estimates and information.             The Preadmit Testing Office will be calling to schedule a visit to obtain information for the anesthesia team  and provide instructions on preparation for surgery.  What can you expect for the Preadmit Testing Visit: Appointments may be scheduled in-person or by telephone.  If a telephone visit is scheduled, you may be asked to come into the office to have lab tests or other studies performed.   This visit will not be completed any greater  than 14 days prior to your surgery.  If your surgery has been scheduled for a future date, please do not be alarmed if we have not contacted you to schedule an appointment more than a month prior to the surgery date.    Please be prepared to provide the following information during this appointment:            -Personal medical history                                               -Medication and allergy list            -Any history of problems with anesthesia              -Recent lab work or diagnostic studies            -Please notify us  of any needs we should be aware of to provide the best care possible           -You will be provided with instructions on how to prepare for your surgery.    On The Day of Surgery:  You must have a driver to take you home after surgery, you will be asked not to drive for 24 hours following surgery.  Taxi, Gisele and non-medical transport will not be acceptable means of transportation unless you have a responsible individual who will be traveling with you.  Visitors in the surgical area:   2 people will be able to visit you in your room once your preparation for surgery has been completed. During surgery, your visitors will be asked to wait in the Surgery Waiting Area.  It is not a requirement for them to stay, if they prefer to leave and come back.  Your visitor(s) will be given an update once the surgery has been completed.  No visitors are allowed in the initial recovery room to respect patient privacy and safety.  Once you are more awake and transfer to the secondary recovery area, or are transferred to an inpatient room, visitors will again be able to see you.  To respect and protect your privacy: We will ask on the day of surgery who your driver will be and what the contact number for that individual will be. We will ask if it is okay to share information with this individual, or if there is an alternative individual that we, or the surgeon, should  contact to provide updates and information. If family or friends come to the surgical information desk requesting information about you, who you have not listed with us , no information will be given.   It may be helpful to designate someone as the main contact who will be responsible for updating your other friends and family.    PREADMIT TESTING OFFICE: [phone] SAME DAY SURGERY: [phone] We look forward to caring for you before and throughout the process of your surgery.

## 2024-09-30 NOTE — H&P (View-Only) (Signed)
 "   Referring Physician:  Melvin Pao, NP [address] 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  She unfortunately is having increased significant pain as well as worsening right foot weakness.  She completed physical therapy in which she feels as though it was made worse.  Pain is primarily on the right side of her back extending down her buttock and crossing over to the front of her leg below the knee into there foot. This is associated with numbness and tingling.  She is having to use a cane to ambulate.  This is unrelieved with her current pain regimen.  No saddle anesthesia or incontinence of bowel bladder.   Past Surgery:   Bethany Lara has no symptoms of cervical myelopathy.  The symptoms are causing a significant impact on the patient's life.   Review of Systems:  A 10 point review of systems is negative, except for the pertinent positives and negatives detailed in the HPI.  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/26/2017    Past Surgical History: Past Surgical History:  Procedure Laterality Date   BUNIONECTOMY     CARPAL TUNNEL RELEASE  11/14/2015   CARPAL TUNNEL RELEASE Right 06/17/2019   CHOLECYSTECTOMY     EYELID LACERATION REPAIR     Eye Lid Surgery   PEG PLACEMENT N/A 10/30/2017   Procedure: PERCUTANEOUS ENDOSCOPIC GASTROSTOMY (PEG) PLACEMENT;  Surgeon: Unk Corinn Skiff, MD;  Location: [facility] ENDOSCOPY;  Service: Gastroenterology;  Laterality: N/A;    Allergies: Allergies as of 06/23/2024 - Review Complete 06/23/2024  Allergen Reaction Noted   Crestor  [rosuvastatin  calcium ] Other (See Comments) 06/29/2019   Zocor  [simvastatin] Other (See Comments) 07/15/2015    Medications: Outpatient Encounter Medications as of 06/23/2024  Medication Sig   cetirizine  (ZYRTEC ) 10 MG tablet Take 1 tablet (10 mg total) by mouth daily.   clopidogrel  (PLAVIX ) 75 MG tablet Take 1 tablet (75 mg total) by mouth daily.   CO ENZYME Q-10 PO Take by mouth daily.   cyclobenzaprine  (FLEXERIL ) 5 MG tablet Take 1 tablet (5 mg total) by mouth 3 (three) times daily as needed.   diclofenac  Sodium (VOLTAREN ) 1 % GEL Apply 2 g topically 4 (four) times daily.   fenofibrate  160 MG tablet Take 1 tablet (160 mg total) by mouth daily.   gabapentin  (NEURONTIN ) 100 MG capsule Take 1 capsule (100 mg total) by mouth 3 (three) times daily.   HYDROcodone -acetaminophen  (NORCO/VICODIN) 5-325 MG tablet Take 1 tablet by mouth 3 (three) times daily as needed for up to 3 days for moderate pain (pain score 4-6).   MAGNESIUM PO Take by mouth daily.   NON FORMULARY CPAP @@ bedtime.   predniSONE  (DELTASONE ) 20 MG tablet Take 2 tablets (40 mg total) by mouth daily with breakfast for 5 days.   traMADol  (ULTRAM ) 50 MG tablet Take 50 mg by mouth every 6 (six) hours as needed.   No facility-administered encounter medications on file as of 06/23/2024.    Social History: Social History   Tobacco Use   Smoking status: Never   Smokeless tobacco: Never  Vaping Use   Vaping status: Never  Used  Substance Use Topics   Alcohol use: No    Alcohol/week: 0.0 standard drinks of alcohol   Drug use: No    Family Medical History: Family History  Problem Relation Age of Onset   Diabetes Mother    Hypertension Mother    Dementia Mother    Heart murmur Mother    Diabetes Father    Heart disease Father    Alzheimer's disease Father    Glaucoma Father    Colon cancer Father    Heart attack Father    Breast cancer Maternal Aunt     Physical Examination: NEUROLOGICAL:     Awake, alert, oriented to person, place, and time.  Speech is clear and fluent. Fund  of knowledge is appropriate.   Cranial Nerves: Pupils equal round and reactive to light.  Facial tone is symmetric.   ROM of spine: Patient is not tender to palpation of her lumbar paraspinals.  Strength:  Side Iliopsoas Quads Hamstring PF DF EHL  R 5 5 4+ 4 Pain limited, at least a 4 - 4+  L 5 5 5 5 5 5     Positive straight leg raise on the right lower extremity  2+ patella,trace achilles, reflex.  No clonus    Medical Decision Making    Narrative & Impression  EXAM: MRI LUMBAR SPINE 07/03/2024 10:17:00 AM   TECHNIQUE: Multiplanar multisequence MRI of the lumbar spine was performed without and with the administration of intravenous contrast.   COMPARISON: Lumbar MRI 05/16/2024.   CLINICAL HISTORY: 74 year old female with lumbar radiculopathy, persistent symptoms, LBP radiating to L hip, and R foot numbness. Previous MRI 05/16/2024.   FINDINGS:   BONES AND ALIGNMENT: Lumbar segmentation appears to be normal (as reported in August). Normal alignment. Normal vertebral body heights. Bone marrow signal is unremarkable. No marrow edema. Intact visible sacrum and SI joints. Grade 1 anterolisthesis of L4 on L5 is stable. Subtle anterolisthesis at L5-S1.   SPINAL CORD: The conus terminates at T12-L1. No signal abnormality in the visible lower thoracic spinal cord or conus. Medullaris generally normal cauda equina nerve roots. Capacious spinal canal above L4-L5. No abnormality of intradural enhancement.   SOFT TISSUES: No paraspinal mass. Negative visible abdominal viscera; small simple and benign renal cysts (no follow up imaging recommended).   Levels above L4-L5 are Stable and normal for age.   L4-L5: Chronic grade 1 anterolisthesis. Mild circumferential disc bulge/pseudodisc. Moderate to severe ligamentum flavum hypertrophy. Moderate facet hypertrophy with degenerative facet joint fluid which has mildly increased. Mild epidural lipomatosis. Moderate  multifactorial spinal stenosis. Asymmetric mild to moderate left L4 neural foraminal stenosis appears stable.   L5-S1: Subtle anterolisthesis. Circumferential disc and endplate spurring, asymmetric to the right. Asymmetric moderate to severe facet arthropathy at this level appears to be severe on the right. Chronic degenerative facet joint fluid bilaterally. The space occupying mass within the right lateral recess and right neural foramen here persists, and does not solidly enhance, but rather intensively rim enhances (series 115 images 3 through 5) and also appears inseparable from the chronically degenerated right facet which demonstrates a similar enhancement pattern (series 200 images 32 and 33). As before, no significant spinal stenosis but moderate right lateral recess stenosis (descending right S1 nerve level) and severe right L5 neural foraminal stenosis. Deedra this is an unusually severe right side facet related degenerative synovial cyst, dissecting into and through the right L5 neural foramen.       IMPRESSION: 1. Right L5-S1 foraminal  space-occupying mass persists, with intense rim enhancement only. Notably, it is inseparable from the right L5-S1 facet - which demonstrates severe asymmetric degeneration, and similar postcontrast enhancement. Although not specific Deedra an unusual and large degenerative synovial cyst dissecting into and through the right L5 neural foramen, causing severe foraminal stenosis, and moderate right lateral recess stenosis. 2. Stable lumbar spine otherwise. Grade 1 L4-L5 anterolisthesis and moderate multifactorial spinal stenosis. Stable asymmetric left L4 foraminal stenosis.   Electronically signed by: Helayne Hurst MD 07/07/2024 10:14 AM EDT RP  EXAM: 4 VIEW(S) XRAY OF THE LUMBAR SPINE 07/20/2024 11:21:12 AM   COMPARISON: MRI lumbar spine 07/03/2024.   CLINICAL HISTORY: low back pain. Lower back pain, spinal stenosis of lumbar region  without neurogenic claudication   FINDINGS:   LUMBAR SPINE:   BONES: Redemonstrated grade 1 anterolisthesis of L4 on L5. Anterolisthesis increases by approximately 2 mm on flexion images and decreases by approximately 1 mm on extension images. Alignment is otherwise maintained. No radiographic evidence of fracture. No aggressive appearing osseous lesion.   DISCS AND DEGENERATIVE CHANGES: Facet arthrosis most pronounced in the lower lumbar spine. Mild disc space narrowing at L4-L5 and mild to moderate disc space narrowing at L5-S1.   SOFT TISSUES: Right upper quadrant surgical clips. Mild stool burden in the right lower quadrant.   IMPRESSION: 1. Grade 1 anterolisthesis of L4 on L5 with dynamic instability, increasing by approximately 2 mm on flexion and decreasing by approximately 1 mm on extension. 2. Mild disc space narrowing at L4-L5. 3. Mild to moderate disc space narrowing at L5-S1. 4. Facet arthrosis, most pronounced in the lower lumbar spine.    Per Dr. Burr has reviewed the images and agree with the above interpretation.  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  She also has listhesis noted at L4-5 and L5-S1.    Assessment and Plan: Bethany Lara is a pleasant 74 y.o. 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  She unfortunately is having increased significant pain as well as worsening right foot weakness.   As stated, she is in severe pain with associated weakness.  Symptoms are consistent with a right sided L5-S1 synovial cyst causing her pain.  I do think she would benefit from surgical decompression.  We will plan for right sided L5-S1 decompression. The risks and  benefits of the surgery were discussed at length.  Patient does have a listhesis and the possibility of a potential fusion in the future was also  discussed depending on her benefit from the above surgery.  She would like to move forward with this in hopes that will help her pain.  In the meantime, we will give additional pain medication to help with her acute pain.  I would also like her to undergo a DEXA scan to evaluate for any osteoporosis and for potential surgical planning in the future.   Lyle Decamp, PA-C dept. of Neurosurgery    Addendum: I saw this patient in conjunction with Lyle Decamp, PA-C.  We discussed her imaging findings including a large right-sided dense L5-S1 synovial cyst causing neural compression.  We had a long discussion about whether or not majority of her pain was radicular in nature or was more mechanical back pain.  Consensus agreement was that this was mostly debilitated by her severe lumbar radiculopathy.  Given this finding we did discuss that  she would be a candidate for a surgical decompression and resection of the synovial cyst.  We did discuss that there is a possibility that the entirety of the cyst would not come out or that there would be a recurrence.  We also discussed that in order to fully decompress that foramen given the size of the cyst would likely need to do something like a facetectomy however that would destabilize her spine.  Given the fact that there is space-occupying mass we will plan for a decompression and resection as initial treatment.  If she has persistent symptoms or does not see significant improvement then we will discuss possibility of moving forward with a spinal fusion via a full facetectomy and transforaminal lumbar interbody fusion in the future.  Risk and benefits of surgery were discussed.  Will plan for a right sided L5-S1 laminectomy for synovial cyst resection. "

## 2024-09-30 NOTE — Progress Notes (Addendum)
 "   Referring Physician:  Melvin Pao, NP [address] [place],  [place] [address]  Primary Physician:  Melvin Pao, NP  History of Present Illness: 09/30/2024 Ms. 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  She unfortunately is having increased significant pain as well as worsening right foot weakness.  She completed physical therapy in which she feels as though it was made worse.  Pain is primarily on the right side of her back extending down her buttock and crossing over to the front of her leg below the knee into there foot. This is associated with numbness and tingling.  She is having to use a cane to ambulate.  This is unrelieved with her current pain regimen.  No saddle anesthesia or incontinence of bowel bladder.   Past Surgery:   AFRIKA BRICK has no symptoms of cervical myelopathy.  The symptoms are causing a significant impact on the patient's life.   Review of Systems:  A 10 point review of systems is negative, except for the pertinent positives and negatives detailed in the HPI.  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/26/2017    Past Surgical History: Past Surgical History:  Procedure Laterality Date   BUNIONECTOMY     CARPAL TUNNEL RELEASE  11/14/2015   CARPAL TUNNEL RELEASE Right 06/17/2019   CHOLECYSTECTOMY     EYELID LACERATION REPAIR     Eye Lid Surgery   PEG PLACEMENT N/A 10/30/2017   Procedure: PERCUTANEOUS ENDOSCOPIC GASTROSTOMY (PEG) PLACEMENT;  Surgeon: Unk Corinn Skiff, MD;  Location: [facility] ENDOSCOPY;  Service: Gastroenterology;  Laterality: N/A;    Allergies: Allergies as of 06/23/2024 - Review Complete 06/23/2024  Allergen Reaction Noted   Crestor  [rosuvastatin  calcium ] Other (See Comments) 06/29/2019   Zocor  [simvastatin] Other (See Comments) 07/15/2015    Medications: Outpatient Encounter Medications as of 06/23/2024  Medication Sig   cetirizine  (ZYRTEC ) 10 MG tablet Take 1 tablet (10 mg total) by mouth daily.   clopidogrel  (PLAVIX ) 75 MG tablet Take 1 tablet (75 mg total) by mouth daily.   CO ENZYME Q-10 PO Take by mouth daily.   cyclobenzaprine  (FLEXERIL ) 5 MG tablet Take 1 tablet (5 mg total) by mouth 3 (three) times daily as needed.   diclofenac  Sodium (VOLTAREN ) 1 % GEL Apply 2 g topically 4 (four) times daily.   fenofibrate  160 MG tablet Take 1 tablet (160 mg total) by mouth daily.   gabapentin  (NEURONTIN ) 100 MG capsule Take 1 capsule (100 mg total) by mouth 3 (three) times daily.   HYDROcodone -acetaminophen  (NORCO/VICODIN) 5-325 MG tablet Take 1 tablet by mouth 3 (three) times daily as needed for up to 3 days for moderate pain (pain score 4-6).   MAGNESIUM PO Take by mouth daily.   NON FORMULARY CPAP @@ bedtime.   predniSONE  (DELTASONE ) 20 MG tablet Take 2 tablets (40 mg total) by mouth daily with breakfast for 5 days.   traMADol  (ULTRAM ) 50 MG tablet Take 50 mg by mouth every 6 (six) hours as needed.   No facility-administered encounter medications on file as of 06/23/2024.    Social History: Social History   Tobacco Use   Smoking status: Never   Smokeless tobacco: Never  Vaping Use   Vaping status: Never  Used  Substance Use Topics   Alcohol use: No    Alcohol/week: 0.0 standard drinks of alcohol   Drug use: No    Family Medical History: Family History  Problem Relation Age of Onset   Diabetes Mother    Hypertension Mother    Dementia Mother    Heart murmur Mother    Diabetes Father    Heart disease Father    Alzheimer's disease Father    Glaucoma Father    Colon cancer Father    Heart attack Father    Breast cancer Maternal Aunt     Physical Examination: NEUROLOGICAL:     Awake, alert, oriented to person, place, and time.  Speech is clear and fluent. Fund  of knowledge is appropriate.   Cranial Nerves: Pupils equal round and reactive to light.  Facial tone is symmetric.   ROM of spine: Patient is not tender to palpation of her lumbar paraspinals.  Strength:  Side Iliopsoas Quads Hamstring PF DF EHL  R 5 5 4+ 4 Pain limited, at least a 4 - 4+  L 5 5 5 5 5 5     Positive straight leg raise on the right lower extremity  2+ patella,trace achilles, reflex.  No clonus    Medical Decision Making    Narrative & Impression  EXAM: MRI LUMBAR SPINE 07/03/2024 10:17:00 AM   TECHNIQUE: Multiplanar multisequence MRI of the lumbar spine was performed without and with the administration of intravenous contrast.   COMPARISON: Lumbar MRI 05/16/2024.   CLINICAL HISTORY: 74 year old female with lumbar radiculopathy, persistent symptoms, LBP radiating to L hip, and R foot numbness. Previous MRI 05/16/2024.   FINDINGS:   BONES AND ALIGNMENT: Lumbar segmentation appears to be normal (as reported in August). Normal alignment. Normal vertebral body heights. Bone marrow signal is unremarkable. No marrow edema. Intact visible sacrum and SI joints. Grade 1 anterolisthesis of L4 on L5 is stable. Subtle anterolisthesis at L5-S1.   SPINAL CORD: The conus terminates at T12-L1. No signal abnormality in the visible lower thoracic spinal cord or conus. Medullaris generally normal cauda equina nerve roots. Capacious spinal canal above L4-L5. No abnormality of intradural enhancement.   SOFT TISSUES: No paraspinal mass. Negative visible abdominal viscera; small simple and benign renal cysts (no follow up imaging recommended).   Levels above L4-L5 are Stable and normal for age.   L4-L5: Chronic grade 1 anterolisthesis. Mild circumferential disc bulge/pseudodisc. Moderate to severe ligamentum flavum hypertrophy. Moderate facet hypertrophy with degenerative facet joint fluid which has mildly increased. Mild epidural lipomatosis. Moderate  multifactorial spinal stenosis. Asymmetric mild to moderate left L4 neural foraminal stenosis appears stable.   L5-S1: Subtle anterolisthesis. Circumferential disc and endplate spurring, asymmetric to the right. Asymmetric moderate to severe facet arthropathy at this level appears to be severe on the right. Chronic degenerative facet joint fluid bilaterally. The space occupying mass within the right lateral recess and right neural foramen here persists, and does not solidly enhance, but rather intensively rim enhances (series 115 images 3 through 5) and also appears inseparable from the chronically degenerated right facet which demonstrates a similar enhancement pattern (series 200 images 32 and 33). As before, no significant spinal stenosis but moderate right lateral recess stenosis (descending right S1 nerve level) and severe right L5 neural foraminal stenosis. Deedra this is an unusually severe right side facet related degenerative synovial cyst, dissecting into and through the right L5 neural foramen.       IMPRESSION: 1. Right L5-S1 foraminal  space-occupying mass persists, with intense rim enhancement only. Notably, it is inseparable from the right L5-S1 facet - which demonstrates severe asymmetric degeneration, and similar postcontrast enhancement. Although not specific Deedra an unusual and large degenerative synovial cyst dissecting into and through the right L5 neural foramen, causing severe foraminal stenosis, and moderate right lateral recess stenosis. 2. Stable lumbar spine otherwise. Grade 1 L4-L5 anterolisthesis and moderate multifactorial spinal stenosis. Stable asymmetric left L4 foraminal stenosis.   Electronically signed by: Helayne Hurst MD 07/07/2024 10:14 AM EDT RP  EXAM: 4 VIEW(S) XRAY OF THE LUMBAR SPINE 07/20/2024 11:21:12 AM   COMPARISON: MRI lumbar spine 07/03/2024.   CLINICAL HISTORY: low back pain. Lower back pain, spinal stenosis of lumbar region  without neurogenic claudication   FINDINGS:   LUMBAR SPINE:   BONES: Redemonstrated grade 1 anterolisthesis of L4 on L5. Anterolisthesis increases by approximately 2 mm on flexion images and decreases by approximately 1 mm on extension images. Alignment is otherwise maintained. No radiographic evidence of fracture. No aggressive appearing osseous lesion.   DISCS AND DEGENERATIVE CHANGES: Facet arthrosis most pronounced in the lower lumbar spine. Mild disc space narrowing at L4-L5 and mild to moderate disc space narrowing at L5-S1.   SOFT TISSUES: Right upper quadrant surgical clips. Mild stool burden in the right lower quadrant.   IMPRESSION: 1. Grade 1 anterolisthesis of L4 on L5 with dynamic instability, increasing by approximately 2 mm on flexion and decreasing by approximately 1 mm on extension. 2. Mild disc space narrowing at L4-L5. 3. Mild to moderate disc space narrowing at L5-S1. 4. Facet arthrosis, most pronounced in the lower lumbar spine.    Per Dr. Burr has reviewed the images and agree with the above interpretation.  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  She also has listhesis noted at L4-5 and L5-S1.    Assessment and Plan: Ms. Schippers is a pleasant 74 y.o. 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  She unfortunately is having increased significant pain as well as worsening right foot weakness.   As stated, she is in severe pain with associated weakness.  Symptoms are consistent with a right sided L5-S1 synovial cyst causing her pain.  I do think she would benefit from surgical decompression.  We will plan for right sided L5-S1 decompression. The risks and  benefits of the surgery were discussed at length.  Patient does have a listhesis and the possibility of a potential fusion in the future was also  discussed depending on her benefit from the above surgery.  She would like to move forward with this in hopes that will help her pain.  In the meantime, we will give additional pain medication to help with her acute pain.  I would also like her to undergo a DEXA scan to evaluate for any osteoporosis and for potential surgical planning in the future.   Lyle Decamp, PA-C dept. of Neurosurgery    Addendum: I saw this patient in conjunction with Lyle Decamp, PA-C.  We discussed her imaging findings including a large right-sided dense L5-S1 synovial cyst causing neural compression.  We had a long discussion about whether or not majority of her pain was radicular in nature or was more mechanical back pain.  Consensus agreement was that this was mostly debilitated by her severe lumbar radiculopathy.  Given this finding we did discuss that  she would be a candidate for a surgical decompression and resection of the synovial cyst.  We did discuss that there is a possibility that the entirety of the cyst would not come out or that there would be a recurrence.  We also discussed that in order to fully decompress that foramen given the size of the cyst would likely need to do something like a facetectomy however that would destabilize her spine.  Given the fact that there is space-occupying mass we will plan for a decompression and resection as initial treatment.  If she has persistent symptoms or does not see significant improvement then we will discuss possibility of moving forward with a spinal fusion via a full facetectomy and transforaminal lumbar interbody fusion in the future.  Risk and benefits of surgery were discussed.  Will plan for a right sided L5-S1 laminectomy for synovial cyst resection. "

## 2024-10-05 ENCOUNTER — Encounter: Payer: Self-pay | Admitting: Nurse Practitioner

## 2024-10-05 ENCOUNTER — Ambulatory Visit: Admitting: Nurse Practitioner

## 2024-10-05 VITALS — BP 117/79 | HR 59 | Temp 97.7°F | Ht 60.0 in | Wt 169.0 lb

## 2024-10-05 DIAGNOSIS — R8290 Unspecified abnormal findings in urine: Secondary | ICD-10-CM

## 2024-10-05 DIAGNOSIS — Z01818 Encounter for other preprocedural examination: Secondary | ICD-10-CM | POA: Diagnosis not present

## 2024-10-05 DIAGNOSIS — M7989 Other specified soft tissue disorders: Secondary | ICD-10-CM | POA: Diagnosis not present

## 2024-10-05 DIAGNOSIS — E7849 Other hyperlipidemia: Secondary | ICD-10-CM | POA: Diagnosis not present

## 2024-10-05 DIAGNOSIS — R7303 Prediabetes: Secondary | ICD-10-CM | POA: Diagnosis not present

## 2024-10-05 DIAGNOSIS — R9431 Abnormal electrocardiogram [ECG] [EKG]: Secondary | ICD-10-CM

## 2024-10-05 DIAGNOSIS — M4727 Other spondylosis with radiculopathy, lumbosacral region: Secondary | ICD-10-CM

## 2024-10-05 DIAGNOSIS — G4733 Obstructive sleep apnea (adult) (pediatric): Secondary | ICD-10-CM

## 2024-10-05 LAB — URINALYSIS, ROUTINE W REFLEX MICROSCOPIC
Bilirubin, UA: NEGATIVE
Glucose, UA: NEGATIVE
Ketones, UA: NEGATIVE
Nitrite, UA: NEGATIVE
Protein,UA: NEGATIVE
RBC, UA: NEGATIVE
Specific Gravity, UA: <1.005 — ABNORMAL LOW (ref 1.005–1.030)
Urobilinogen, Ur: 0.2 mg/dL (ref 0.2–1.0)
pH, UA: 6 (ref 5.0–7.5)

## 2024-10-05 LAB — MICROSCOPIC EXAMINATION: Bacteria, UA: NONE SEEN

## 2024-10-05 MED ORDER — FUROSEMIDE 20 MG PO TABS: 20.0000 mg | ORAL_TABLET | Freq: Every day | ORAL | 0 refills | Status: DC

## 2024-10-05 NOTE — Progress Notes (Addendum)
 "  BP 117/79 (BP Location: Right Arm, Patient Position: Sitting, Cuff Size: Normal)   Pulse (!) 59   Temp 97.7 F (36.5 C) (Oral)   Ht 5' (1.524 m)   Wt 169 lb (76.7 kg)   LMP  (LMP Unknown)   SpO2 99%   BMI 33.01 kg/m    Subjective:    Patient ID: Bethany Lara, female    DOB: 12-Oct-1950, 74 y.o.   MRN: [id number]  HPI: Bethany Lara is a 74 y.o. female  Chief Complaint  Patient presents with   surgery clearance    Patient is asking to be put on prednisone  because of swelling in feet and legs. She's had swelling for over a week.   10/16/2024  Assessment:  Preoperative evaluation and clearance show: Patient is cleared for diagnosis for surgical procedure.  Other diagnoses affecting surgical risk include: Other Laminectomy  Patient states she the swelling started when she went off the prednisone  that she was on for her back pain.  She was on it for 7 days.  She feels like her whole body is swelling.  She is taking 99mg  of potassium OTC.  She was taking two of them over the counter.     Type of Surgery: Intermediate, 1% - 5% cardiac risk (major intraabdominal, intrathoracic, orthopedic, major head & neck, prostatectomy)   Lee's Revised Cardiac Index:  1 Risk class II, low, 0.9% risk of cardiac complications   Plan:  1. Patient requires endocarditis prophylaxis: no. 2. GENERAL PREOP INSTRUCTIONS: Proceed with surgery as planned. No food or liquids the morning of surgery. Call surgeon if develops respiratory illness, fever, or other illness. 3. Medications to Hold: Plavix  for 7 days before surgery, unless instructed otherwise by surgeon. 4. EKG:  10/16/2024 : Slight Abnormality in ST 5. Ordered Labs: CMP, CBC, Lipid, TSH, A1c and UA  From a medical standpoint the patient is an acceptable candidates to undergo general anesthesia for surgery. It is recommended to correct electrolytes and to keep the patient euvolemic and avoid significant fluid shifts during  surgery.  Labs reviewed and pt is cleared for surgery. Pt denies a hx of adverse reactions to anesthesia.    Bethany Lara is a 74 y.o. female who presents to the office today for a preoperative consultation at the request of Dr. Claudene, who will perform a  Laminectomy on 10/21/23.  Current Complaints: back pain and lower extremity swelling  Past Medical History:  Diagnosis Date   Allergy    Arrhythmia    Carpal tunnel syndrome    Depression    situational doe to chronic pain   Hyperlipidemia    Osteoarthritis of left hip    Sleep apnea    no cpap   Stroke 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 50 tablet 2   cetirizine  (ZYRTEC ) 10 MG tablet Take 1 tablet (10 mg total) by  mouth daily. (Patient taking differently: Take 10 mg by mouth daily as needed for allergies.) 90 tablet 1   clopidogrel  (PLAVIX ) 75 MG tablet Take 1 tablet (75 mg total) by mouth daily. 100 tablet 1   diclofenac  Sodium (VOLTAREN ) 1 % GEL Apply 2 g topically 4 (four) times daily. 2 g 1   fenofibrate  160 MG tablet Take 1 tablet (160 mg total) by mouth daily. 100 tablet 1   MAGNESIUM  PO Take 1 tablet by mouth daily.     Omega-3 Fatty Acids (OMEGA 3 FISH OIL PO) Take 1 capsule by mouth daily.     pregabalin  (LYRICA ) 25 MG capsule Take 1 capsule (25 mg total) by mouth at bedtime as needed. 30 capsule 0   furosemide  (LASIX ) 40 MG tablet Take 1 tablet (40 mg total) by mouth daily. 90 tablet 3   Multiple Vitamin (MULTIVITAMIN WITH  MINERALS) TABS tablet Take 1 tablet by mouth daily.     NON FORMULARY CPAP @@ bedtime.     oxyCODONE  (ROXICODONE ) 5 MG immediate release tablet Take 0.5-1 tablets (2.5-5 mg total) by mouth every 6 (six) hours as needed for breakthrough pain or severe pain (pain score 7-10). 20 tablet 0   tiZANidine  (ZANAFLEX ) 4 MG tablet Take 1 tablet (4 mg total) by mouth every 6 (six) hours as needed for muscle spasms. 30 tablet 0   Vitamin D -Vitamin K (VITAMIN K2-VITAMIN D3 PO) Take 1 tablet by mouth daily.     No current facility-administered medications for this visit.   Allergies[1] Social History   Socioeconomic History   Marital status: Married    Spouse name: Senner,Anthony A   Number of children: Not on file   Years of education: 12th grade   Highest education level: Not on file  Occupational History   Occupation: retired  Tobacco Use   Smoking status: Never   Smokeless tobacco: Never  Vaping Use   Vaping status: Never Used  Substance and Sexual Activity   Alcohol use: No    Alcohol/week: 0.0 standard drinks of alcohol   Drug use: No   Sexual activity: Not Currently  Other Topics Concern   Not on file  Social History Narrative   51 yr old mother lives with her . She has a caregiver for her MOM   Social Drivers of Health   Tobacco Use: Low Risk (10/16/2024)   Patient History    Smoking Tobacco Use: Never    Smokeless Tobacco Use: Never    Passive Exposure: Not on file  Financial Resource Strain: Low Risk  (03/23/2024)   Received from [facility]   Overall Financial Resource Strain (CARDIA)    Difficulty of Paying Living Expenses: Not hard at all  Food Insecurity: No Food Insecurity (03/23/2024)   Received from [facility]   Epic    Within the past 12 months, you worried that your food would run out before you got the money to buy more.: Never true    Within the past 12 months, the food you bought just didn't last and you didn't have money  to get more.: Never true  Transportation Needs: No Transportation Needs (03/23/2024)   Received from [facility] - Transportation    In the past 12 months, has lack of transportation kept you from medical appointments or from getting medications?: No    Lack of Transportation (Non-Medical): No  Physical Activity: Not on file  Stress: Not on file  Social  Connections: Not on file  Intimate Partner Violence: Not on file  Depression (PHQ2-9): Low Risk (10/14/2024)   Depression (PHQ2-9)    PHQ-2 Score: 0  Recent Concern: Depression (PHQ2-9) - High Risk (09/01/2024)   Depression (PHQ2-9)    PHQ-2 Score: 12  Alcohol Screen: Not on file  Housing: Low Risk  (03/23/2024)   Received from [facility]   Epic    In the last 12 months, was there a time when you were not able to pay the mortgage or rent on time?: No    In the past 12 months, how many times have you moved where you were living?: 0    At any time in the past 12 months, were you homeless or living in a shelter (including now)?: No  Utilities: Not At Risk (03/23/2024)   Received from [facility]   Epic    In the past 12 months has the electric, gas, oil, or water  company threatened to shut off services in your home?: No  Health Literacy: Adequate Health Literacy (02/13/2024)   B1300 Health Literacy    Frequency of need for help with medical instructions: Never     Preoperative Risk Factors  1. Major predictors that require intensive management and may lead to delay in or cancellation of the operative procedure unless emergent:  No Unstable coronary syndromes including unstable or severe angina or recent MI  No Decompensated heart failure including NYHA functional class 4 or worsening or new onset HF  no Significant arrhythmia including high grade AV block, symptomatic ventricular arrhythmias, supraventricular arrhythmias with a ventricular rate >100 bpm at rest,  symptomatic bradycardia, and newly recognized ventricular tachycardia  no Severe heart valve disease including severe aortic stenosis or symptomatic mitral stenosis  2. Additional independent predictors of major cardiac complications:  No Hgh risk type of surgery (Vascular surgery and any open intraperitoneal or intrathoracic procedures)  Other clinical predictors that warrant careful assessment of current status  No History of ischemic heart disease No History of CVA no  History of compensated heart failure or prior heart failure no Diabetes mellitus on Insulin  no Renal insufficiency  3. Perioperative cardiac and long term risk is increased in pts unable to meet a 4-MET demand during most normal daily activities:  Yes Ability to climb 2 flights of stairs or walk four blocks  Objective:  BP 117/79 (BP Location: Right Arm, Patient Position: Sitting, Cuff Size: Normal)   Pulse (!) 59   Temp 97.7 F (36.5 C) (Oral)   Ht 5' (1.524 m)   Wt 169 lb (76.7 kg)   LMP  (LMP Unknown)   SpO2 99%   BMI 33.01 kg/m   Body mass index is 33.01 kg/m.     Darice Petty 10/16/24   Relevant past medical, surgical, family and social history reviewed and updated as indicated. Interim medical history since our last visit reviewed. Allergies and medications reviewed and updated.  Review of Systems  Cardiovascular:  Positive for leg swelling.  Musculoskeletal:  Positive for back pain.    Per HPI unless specifically indicated above     Objective:    BP 117/79 (BP Location: Right Arm, Patient Position: Sitting, Cuff Size: Normal)   Pulse (!) 59   Temp 97.7 F (36.5 C) (Oral)   Ht 5' (1.524 m)   Wt 169 lb (76.7 kg)   LMP  (LMP Unknown)   SpO2 99%   BMI 33.01 kg/m   Wt Readings from Last  3 Encounters:  10/16/24 183 lb (83 kg)  10/14/24 183 lb (83 kg)  10/14/24 189 lb (85.7 kg)    Physical Exam Vitals and nursing note reviewed.  Constitutional:      General: She is awake.  She is not in acute distress.    Appearance: She is well-developed. She is not ill-appearing.  HENT:     Head: Normocephalic and atraumatic.     Right Ear: Hearing, tympanic membrane, ear canal and external ear normal. No drainage.     Left Ear: Hearing, tympanic membrane, ear canal and external ear normal. No drainage.     Nose: Nose normal.     Right Sinus: No maxillary sinus tenderness or frontal sinus tenderness.     Left Sinus: No maxillary sinus tenderness or frontal sinus tenderness.     Mouth/Throat:     Mouth: Mucous membranes are moist.     Pharynx: Oropharynx is clear. Uvula midline. No pharyngeal swelling, oropharyngeal exudate or posterior oropharyngeal erythema.  Eyes:     General: Lids are normal.        Right eye: No discharge.        Left eye: No discharge.     Extraocular Movements: Extraocular movements intact.     Conjunctiva/sclera: Conjunctivae normal.     Pupils: Pupils are equal, round, and reactive to light.     Visual Fields: Right eye visual fields normal and left eye visual fields normal.  Neck:     Thyroid : No thyromegaly.     Vascular: No carotid bruit.     Trachea: Trachea normal.  Cardiovascular:     Rate and Rhythm: Normal rate and regular rhythm.     Heart sounds: Normal heart sounds. No murmur heard.    No gallop.  Pulmonary:     Effort: Pulmonary effort is normal. No accessory muscle usage or respiratory distress.     Breath sounds: Normal breath sounds.  Chest:  Breasts:    Right: Normal.     Left: Normal.  Abdominal:     General: Bowel sounds are normal.     Palpations: Abdomen is soft. There is no hepatomegaly or splenomegaly.     Tenderness: There is no abdominal tenderness.  Musculoskeletal:        General: Normal range of motion.     Cervical back: Normal range of motion and neck supple.     Right lower leg: 2+ Pitting Edema present.     Left lower leg: 2+ Pitting Edema present.  Lymphadenopathy:     Head:     Right side of head:  No submental, submandibular, tonsillar, preauricular or posterior auricular adenopathy.     Left side of head: No submental, submandibular, tonsillar, preauricular or posterior auricular adenopathy.     Cervical: No cervical adenopathy.     Upper Body:     Right upper body: No supraclavicular, axillary or pectoral adenopathy.     Left upper body: No supraclavicular, axillary or pectoral adenopathy.  Skin:    General: Skin is warm and dry.     Capillary Refill: Capillary refill takes less than 2 seconds.     Findings: No rash.  Neurological:     Mental Status: She is alert and oriented to person, place, and time.     Gait: Gait is intact.  Psychiatric:        Attention and Perception: Attention normal.        Mood and Affect: Mood normal.  Speech: Speech normal.        Behavior: Behavior normal. Behavior is cooperative.        Thought Content: Thought content normal.        Judgment: Judgment normal.     Results for orders placed or performed in visit on 10/05/24  Microscopic Examination   Collection Time: 10/05/24 11:22 AM   Urine  Result Value Ref Range   WBC, UA 0-5 0 - 5 /hpf   RBC, Urine 0-2 0 - 2 /hpf   Epithelial Cells (non renal) 0-10 0 - 10 /hpf   Bacteria, UA None seen None seen/Few  Urinalysis, Routine w reflex microscopic   Collection Time: 10/05/24 11:22 AM  Result Value Ref Range   Specific Gravity, UA <1.005 (L) 1.005 - 1.030   pH, UA 6.0 5.0 - 7.5   Color, UA Yellow Yellow   Appearance Ur Clear Clear   Leukocytes,UA Trace (A) Negative   Protein,UA Negative Negative/Trace   Glucose, UA Negative Negative   Ketones, UA Negative Negative   RBC, UA Negative Negative   Bilirubin, UA Negative Negative   Urobilinogen, Ur 0.2 0.2 - 1.0 mg/dL   Nitrite, UA Negative Negative   Microscopic Examination See below:   CBC with Differential/Platelet   Collection Time: 10/05/24 11:23 AM  Result Value Ref Range   WBC 7.8 3.4 - 10.8 x10E3/uL   RBC 4.72 3.77 - 5.28  x10E6/uL   Hemoglobin 14.8 11.1 - 15.9 g/dL   Hematocrit 55.2 65.9 - 46.6 %   MCV 95 79 - 97 fL   MCH 31.4 26.6 - 33.0 pg   MCHC 33.1 31.5 - 35.7 g/dL   RDW 87.8 88.2 - 84.5 %   Platelets 245 150 - 450 x10E3/uL   Neutrophils 60 Not Estab. %   Lymphs 26 Not Estab. %   Monocytes 9 Not Estab. %   Eos 4 Not Estab. %   Basos 1 Not Estab. %   Neutrophils Absolute 4.7 1.4 - 7.0 x10E3/uL   Lymphocytes Absolute 2.0 0.7 - 3.1 x10E3/uL   Monocytes Absolute 0.7 0.1 - 0.9 x10E3/uL   EOS (ABSOLUTE) 0.3 0.0 - 0.4 x10E3/uL   Basophils Absolute 0.1 0.0 - 0.2 x10E3/uL   Immature Granulocytes 0 Not Estab. %   Immature Grans (Abs) 0.0 0.0 - 0.1 x10E3/uL  Comprehensive metabolic panel with GFR   Collection Time: 10/05/24 11:23 AM  Result Value Ref Range   Glucose 104 (H) 70 - 99 mg/dL   BUN 14 8 - 27 mg/dL   Creatinine, Ser 9.16 0.57 - 1.00 mg/dL   eGFR 73 >40 fO/fpw/8.26   BUN/Creatinine Ratio 17 12 - 28   Sodium 141 134 - 144 mmol/L   Potassium 4.6 3.5 - 5.2 mmol/L   Chloride 103 96 - 106 mmol/L   CO2 21 20 - 29 mmol/L   Calcium  10.1 8.7 - 10.3 mg/dL   Total Protein 6.7 6.0 - 8.5 g/dL   Albumin 4.6 3.8 - 4.8 g/dL   Globulin, Total 2.1 1.5 - 4.5 g/dL   Bilirubin Total 0.7 0.0 - 1.2 mg/dL   Alkaline Phosphatase 92 49 - 135 IU/L   AST 17 0 - 40 IU/L   ALT 25 0 - 32 IU/L  Lipid panel   Collection Time: 10/05/24 11:23 AM  Result Value Ref Range   Cholesterol, Total 310 (H) 100 - 199 mg/dL   Triglycerides 581 (H) 0 - 149 mg/dL   HDL 45 >60 mg/dL   VLDL  Cholesterol Cal 84 (H) 5 - 40 mg/dL   LDL Chol Calc (NIH) 818 (H) 0 - 99 mg/dL   LDL CALC COMMENT: Comment    Chol/HDL Ratio 6.9 (H) 0.0 - 4.4 ratio  TSH   Collection Time: 10/05/24 11:23 AM  Result Value Ref Range   TSH 1.850 0.450 - 4.500 uIU/mL  Hemoglobin A1c   Collection Time: 10/05/24 11:23 AM  Result Value Ref Range   Hgb A1c MFr Bld 5.7 (H) 4.8 - 5.6 %   Est. average glucose Bld gHb Est-mCnc 117 mg/dL  Urine Culture    Collection Time: 10/05/24  3:03 PM   Specimen: Urine   UR  Result Value Ref Range   Urine Culture, Routine Final report    Organism ID, Bacteria Comment       Assessment & Plan:   Problem List Items Addressed This Visit       Respiratory   Sleep apnea     Nervous and Auditory   Lumbosacral spondylosis with radiculopathy   Relevant Orders   CBC with Differential/Platelet (Completed)   Comprehensive metabolic panel with GFR (Completed)   Lipid panel (Completed)   TSH (Completed)   Urinalysis, Routine w reflex microscopic (Completed)   Hemoglobin A1c (Completed)   EKG 12-Lead (Completed)     Other   Hyperlipidemia   Prediabetes   Relevant Orders   Hemoglobin A1c (Completed)   Obesity, morbid (HCC)   Other Visit Diagnoses       Pre-op examination    -  Primary   Cleared for surgery. Patient had Normal stress test with Cardiology on 10/15/24.  Labs unremarkable.   Relevant Orders   CBC with Differential/Platelet (Completed)   Comprehensive metabolic panel with GFR (Completed)   Lipid panel (Completed)   TSH (Completed)   Urinalysis, Routine w reflex microscopic (Completed)   Hemoglobin A1c (Completed)   EKG 12-Lead (Completed)     Abnormal EKG       Referral placed for Cardiology for pre op clearance.   Relevant Orders   Ambulatory referral to Cardiology     Swelling of lower extremity       Furosemide  given for 2+ pitting edema. Referral to Cardiology for pre op clearance.  Follow up in 1 week for reevaluation.     Abnormal urinalysis       Relevant Orders   Urine Culture (Completed)        Follow up plan: Return in about 1 week (around 10/12/2024) for FU lower extremity swelling.         [1]  Allergies Allergen Reactions   Crestor  [Rosuvastatin  Calcium ] Other (See Comments)    myalgias   Zocor [Simvastatin] Other (See Comments)    Fatigue, legs achey    "

## 2024-10-05 NOTE — Addendum Note (Signed)
 Addended by: MELVIN PAO on: 10/05/2024 02:48 PM   Modules accepted: Orders

## 2024-10-06 ENCOUNTER — Ambulatory Visit: Payer: Self-pay | Admitting: Nurse Practitioner

## 2024-10-06 LAB — COMPREHENSIVE METABOLIC PANEL WITH GFR
ALT: 25 IU/L (ref 0–32)
AST: 17 IU/L (ref 0–40)
Albumin: 4.6 g/dL (ref 3.8–4.8)
Alkaline Phosphatase: 92 IU/L (ref 49–135)
BUN/Creatinine Ratio: 17 (ref 12–28)
BUN: 14 mg/dL (ref 8–27)
Bilirubin Total: 0.7 mg/dL (ref 0.0–1.2)
CO2: 21 mmol/L (ref 20–29)
Calcium: 10.1 mg/dL (ref 8.7–10.3)
Chloride: 103 mmol/L (ref 96–106)
Creatinine, Ser: 0.83 mg/dL (ref 0.57–1.00)
Globulin, Total: 2.1 g/dL (ref 1.5–4.5)
Glucose: 104 mg/dL — ABNORMAL HIGH (ref 70–99)
Potassium: 4.6 mmol/L (ref 3.5–5.2)
Sodium: 141 mmol/L (ref 134–144)
Total Protein: 6.7 g/dL (ref 6.0–8.5)
eGFR: 73 mL/min/1.73

## 2024-10-06 LAB — CBC WITH DIFFERENTIAL/PLATELET
Basophils Absolute: 0.1 x10E3/uL (ref 0.0–0.2)
Basos: 1 %
EOS (ABSOLUTE): 0.3 x10E3/uL (ref 0.0–0.4)
Eos: 4 %
Hematocrit: 44.7 % (ref 34.0–46.6)
Hemoglobin: 14.8 g/dL (ref 11.1–15.9)
Immature Grans (Abs): 0 x10E3/uL (ref 0.0–0.1)
Immature Granulocytes: 0 %
Lymphocytes Absolute: 2 x10E3/uL (ref 0.7–3.1)
Lymphs: 26 %
MCH: 31.4 pg (ref 26.6–33.0)
MCHC: 33.1 g/dL (ref 31.5–35.7)
MCV: 95 fL (ref 79–97)
Monocytes Absolute: 0.7 x10E3/uL (ref 0.1–0.9)
Monocytes: 9 %
Neutrophils Absolute: 4.7 x10E3/uL (ref 1.4–7.0)
Neutrophils: 60 %
Platelets: 245 x10E3/uL (ref 150–450)
RBC: 4.72 x10E6/uL (ref 3.77–5.28)
RDW: 12.1 % (ref 11.7–15.4)
WBC: 7.8 x10E3/uL (ref 3.4–10.8)

## 2024-10-06 LAB — HEMOGLOBIN A1C
Est. average glucose Bld gHb Est-mCnc: 117 mg/dL
Hgb A1c MFr Bld: 5.7 % — ABNORMAL HIGH (ref 4.8–5.6)

## 2024-10-06 LAB — TSH: TSH: 1.85 u[IU]/mL (ref 0.450–4.500)

## 2024-10-06 LAB — LIPID PANEL
Chol/HDL Ratio: 6.9 ratio — ABNORMAL HIGH (ref 0.0–4.4)
Cholesterol, Total: 310 mg/dL — ABNORMAL HIGH (ref 100–199)
HDL: 45 mg/dL
LDL Chol Calc (NIH): 181 mg/dL — ABNORMAL HIGH (ref 0–99)
Triglycerides: 418 mg/dL — ABNORMAL HIGH (ref 0–149)
VLDL Cholesterol Cal: 84 mg/dL — ABNORMAL HIGH (ref 5–40)

## 2024-10-06 NOTE — Progress Notes (Unsigned)
 " Cardiology Office Note   Date:  10/08/2024  ID:  Bethany Lara, DOB Feb 17, 1951, MRN [id number] PCP: Melvin Pao, NP   [facility] Providers Cardiologist:  Caron Poser, MD     History of Present Illness Bethany Lara is a 74 y.o. female PMH HLD who presents for perioperative cardiovascular risk assessment before L5-S1 laminectomy scheduled 10/20/2024.  Previously seen by our group in 2019.  Cardiac testing unremarkable at that time.  Last LDL 181 09/2024.  PCP noted last week lower extremity edema and an abnormal ECG.  Patient denies any chest discomfort or shortness of breath.  However, due to severe back pain, she is really unable to ambulate or exert herself.  She is unable to perform 4 METS due to pain.  She also has significant lower extremity edema.  Relevant CVD History - Monitor 05/2018 no arrhythmias - SPECT 04/2018 low risk - TTE 10/2017 normal biventricular function, no significant valvular disease   ROS: Pt denies any chest discomfort, jaw pain, arm pain, palpitations, syncope, presyncope, orthopnea, PND, or LE edema.  Studies Reviewed I have independently reviewed the patient's ECG, previous cardiac testing, previous medical records, previous blood work.  Physical Exam VS:  BP 120/72 (BP Location: Left Arm, Patient Position: Sitting, Cuff Size: Large)   Pulse 65   Ht 5' (1.524 m)   Wt 187 lb (84.8 kg)   LMP  (LMP Unknown)   SpO2 96%   BMI 36.52 kg/m        Wt Readings from Last 3 Encounters:  10/08/24 187 lb (84.8 kg)  10/05/24 169 lb (76.7 kg)  09/30/24 169 lb (76.7 kg)    GEN: No acute distress. NECK: No JVD; No carotid bruits. CARDIAC: RRR, no murmurs, rubs, gallops. RESPIRATORY:  Clear to auscultation. EXTREMITIES:  Warm and well-perfused. No edema.  ASSESSMENT AND PLAN Perioperative cardiovascular risk assessment RCRI of 0.  Unable to perform >4 METS due to significant back pain.  She appears euvolemic on exam, so I suspect that  her lower extremity edema is due to chronic venous insufficiency.  However, unable to completely rule out significant cardiac disease without further testing, especially given abnormal ECG.  Would recommend vasodilator stress PET before proceeding with surgery.  Discussed this with her; she is amenable with this plan.  HLD, possibly FH Statin intolerance Last LDL 181 09/2024.  Approaching FH levels.  She has known statin intolerance.  I recommended that we start Repatha therapy; she would like to defer this until after her back surgery.  Lower extremity edema Abnormal ECG Significant lower extremity edema.  She has clear lung fields and flat neck veins.  Denies any dyspnea on exertion, though pain limits her ability to ambulate.  Recent bilateral lower extremity Doppler without DVT.  Function liver function recently checked were normal.  ECG with inferior and anterolateral Q waves; anterolateral Q waves are new from 2019.  Plan: - Increase Lasix  to 40 mg daily - Echocardiogram to rule out significant structural heart disease; this does not need to hold up her surgery, we will get the pre-op information we need (including EF) from the PET scan as above     Informed Consent   The risks [chest pain, shortness of breath, cardiac arrhythmias, dizziness, blood pressure fluctuations, myocardial infarction, stroke/transient ischemic attack, nausea, vomiting, allergic reaction, radiation exposure, metallic taste sensation and life-threatening complications (estimated to be 1 in 10,000)], benefits (risk stratification, diagnosing coronary artery disease, treatment guidance) and alternatives of a cardiac PET  stress test were discussed in detail with Bethany Lara and she agrees to proceed.     Dispo: RTC 3 months or sooner as needed  Signed, Caron Poser, MD  "

## 2024-10-07 LAB — URINE CULTURE

## 2024-10-08 ENCOUNTER — Ambulatory Visit

## 2024-10-08 VITALS — BP 120/72 | HR 65 | Ht 60.0 in | Wt 187.0 lb

## 2024-10-08 DIAGNOSIS — R600 Localized edema: Secondary | ICD-10-CM

## 2024-10-08 DIAGNOSIS — E782 Mixed hyperlipidemia: Secondary | ICD-10-CM | POA: Diagnosis not present

## 2024-10-08 DIAGNOSIS — Z789 Other specified health status: Secondary | ICD-10-CM | POA: Diagnosis not present

## 2024-10-08 DIAGNOSIS — R9431 Abnormal electrocardiogram [ECG] [EKG]: Secondary | ICD-10-CM | POA: Diagnosis not present

## 2024-10-08 DIAGNOSIS — Z01810 Encounter for preprocedural cardiovascular examination: Secondary | ICD-10-CM | POA: Diagnosis not present

## 2024-10-08 MED ORDER — FUROSEMIDE 40 MG PO TABS: 40.0000 mg | ORAL_TABLET | Freq: Every day | ORAL | 3 refills | Status: AC

## 2024-10-08 NOTE — Patient Instructions (Signed)
 Medication Instructions:  Your physician recommends the following medication changes.  Continue your current medicationa as prescribed except:  INCREASE: Furosemide  (LASIX ) from 20 MG to 40 MG   *If you need a refill on your cardiac medications before your next appointment, please call your pharmacy*  Lab Work:  No labs ordered today   If you have labs (blood work) drawn today and your tests are completely normal, you will receive your results only by: MyChart Message (if you have MyChart) OR A paper copy in the mail If you have any lab test that is abnormal or we need to change your treatment, we will call you to review the results.  Testing/Procedures:    Please report to Radiology at the [facility] Main Entrance 30 minutes early for your test.  [address] [place], [place] [address]                         OR   Please report to Radiology at [facility] Main Entrance, medical mall, 30 mins prior to your test.  [address]  [place], [place]  How to Prepare for Your Cardiac PET/CT Stress Test:  Nothing to eat or drink, except water , 3 hours prior to arrival time.  NO caffeine/decaffeinated products, or chocolate 12 hours prior to arrival. (Please note decaffeinated beverages (teas/coffees) still contain caffeine).  If you have caffeine within 12 hours prior, the test will need to be rescheduled.  Medication instructions:     You may take your medications with water , You may want to wait Until after test to take your Lasix   NO perfume, cologne or lotion on chest or abdomen area. FEMALES - Please avoid wearing dresses to this appointment.  Total time is 1 to 2 hours; you may want to bring reading material for the waiting time.  In preparation for your appointment, medication and supplies will be purchased.  Appointment availability is limited, so if you need to cancel or reschedule, please call the Radiology Department  Scheduler at [phone] 24 hours in advance to avoid a cancellation fee of $100.00  What to Expect When you Arrive:  Once you arrive and check in for your appointment, you will be taken to a preparation room within the Radiology Department.  A technologist or Nurse will obtain your medical history, verify that you are correctly prepped for the exam, and explain the procedure.  Afterwards, an IV will be started in your arm and electrodes will be placed on your skin for EKG monitoring during the stress portion of the exam. Then you will be escorted to the PET/CT scanner.  There, staff will get you positioned on the scanner and obtain a blood pressure and EKG.  During the exam, you will continue to be connected to the EKG and blood pressure machines.  A small, safe amount of a radioactive tracer will be injected in your IV to obtain a series of pictures of your heart along with an injection of a stress agent.    After your Exam:  It is recommended that you eat a meal and drink a caffeinated beverage to counter act any effects of the stress agent.  Drink plenty of fluids for the remainder of the day and urinate frequently for the first couple of hours after the exam.  Your doctor will inform you of your test results within 7-10 business days.  For more information and frequently asked questions, please visit  our website: [email or web address]  For questions about your test or how to prepare for your test, please call: Cardiac Imaging Nurse Navigators Office: [phone]    Your physician has requested that you have an echocardiogram. Echocardiography is a painless test that uses sound waves to create images of your heart. It provides your doctor with information about the size and shape of your heart and how well your hearts chambers and valves are working.   You may receive an ultrasound enhancing agent through an IV if needed to better visualize your heart during the echo. This  procedure takes approximately one hour.  There are no restrictions for this procedure.  This will take place at [address] [address], [place] [address]    Follow-Up: At [facility], you and your health needs are our priority.  As part of our continuing mission to provide you with exceptional heart care, our providers are all part of one team.  This team includes your primary Cardiologist (physician) and Advanced Practice Providers or APPs (Physician Assistants and Nurse Practitioners) who all work together to provide you with the care you need, when you need it.  Your next appointment:   3 month(s)  Provider:   You may see Caron Poser, MD or one of the following Advanced Practice Providers on your designated Care Team:   Lonni Meager, NP Lesley Maffucci, PA-C Bernardino Bring, PA-C Cadence [place], PA-C Tylene Lunch, NP Barnie Hila, NP   We recommend signing up for the patient portal called MyChart.  Sign up information is provided on this After Visit Summary.  MyChart is used to connect with patients for Virtual Visits (Telemedicine).  Patients are able to view lab/test results, encounter notes, upcoming appointments, etc.  Non-urgent messages can be sent to your provider as well.   To learn more about what you can do with MyChart, go to [email or web address].   Other Instructions Recommend wearing compression stockings

## 2024-10-12 ENCOUNTER — Encounter: Payer: Self-pay | Admitting: Nurse Practitioner

## 2024-10-12 ENCOUNTER — Encounter
Admission: RE | Admit: 2024-10-12 | Discharge: 2024-10-12 | Disposition: A | Source: Ambulatory Visit | Attending: Neurosurgery | Admitting: Neurosurgery

## 2024-10-12 ENCOUNTER — Ambulatory Visit: Admitting: Nurse Practitioner

## 2024-10-12 ENCOUNTER — Other Ambulatory Visit: Payer: Self-pay

## 2024-10-12 ENCOUNTER — Telehealth: Payer: Self-pay | Admitting: Physician Assistant

## 2024-10-12 VITALS — BP 114/66 | HR 65 | Resp 16 | Wt 186.7 lb

## 2024-10-12 VITALS — BP 108/74 | HR 54 | Temp 97.6°F | Ht 60.0 in | Wt 186.0 lb

## 2024-10-12 DIAGNOSIS — M5417 Radiculopathy, lumbosacral region: Secondary | ICD-10-CM

## 2024-10-12 DIAGNOSIS — Z01812 Encounter for preprocedural laboratory examination: Secondary | ICD-10-CM | POA: Diagnosis present

## 2024-10-12 DIAGNOSIS — M7989 Other specified soft tissue disorders: Secondary | ICD-10-CM

## 2024-10-12 DIAGNOSIS — Z780 Asymptomatic menopausal state: Secondary | ICD-10-CM

## 2024-10-12 DIAGNOSIS — M7138 Other bursal cyst, other site: Secondary | ICD-10-CM

## 2024-10-12 HISTORY — DX: Sleep apnea, unspecified: G47.30

## 2024-10-12 HISTORY — DX: Depression, unspecified: F32.A

## 2024-10-12 LAB — URINALYSIS, ROUTINE W REFLEX MICROSCOPIC
Bacteria, UA: NONE SEEN
Bilirubin Urine: NEGATIVE
Glucose, UA: NEGATIVE mg/dL
Hgb urine dipstick: NEGATIVE
Ketones, ur: NEGATIVE mg/dL
Leukocytes,Ua: NEGATIVE
Nitrite: NEGATIVE
Protein, ur: NEGATIVE mg/dL
RBC / HPF: 0 RBC/hpf (ref 0–5)
Specific Gravity, Urine: 1.003 — ABNORMAL LOW (ref 1.005–1.030)
Squamous Epithelial / HPF: 0 /HPF (ref 0–5)
pH: 7 (ref 5.0–8.0)

## 2024-10-12 LAB — TYPE AND SCREEN
ABO/RH(D): A POS
Antibody Screen: NEGATIVE

## 2024-10-12 LAB — SURGICAL PCR SCREEN
MRSA, PCR: NEGATIVE
Staphylococcus aureus: POSITIVE — AB

## 2024-10-12 MED ORDER — OXYCODONE HCL 5 MG PO TABS: 2.5000 mg | ORAL_TABLET | Freq: Four times a day (QID) | ORAL | 0 refills | Status: DC | PRN

## 2024-10-12 MED ORDER — CYCLOBENZAPRINE HCL 10 MG PO TABS: 10.0000 mg | ORAL_TABLET | Freq: Three times a day (TID) | ORAL | 0 refills | Status: DC | PRN

## 2024-10-12 NOTE — Telephone Encounter (Signed)
 Patient is calling to request a refill of Oxycodone  5mg  and Tizanidine  4mg  be sent to [facility] in [place]. She states that she is having to take the Tizanidine  every 6 hours due to her pain.

## 2024-10-12 NOTE — Progress Notes (Signed)
 "  BP 108/74 (BP Location: Right Arm, Patient Position: Sitting, Cuff Size: Large)   Pulse (!) 54   Temp 97.6 F (36.4 C) (Oral)   Ht 5' (1.524 m)   Wt 186 lb (84.4 kg)   LMP  (LMP Unknown)   SpO2 99%   BMI 36.33 kg/m    Subjective:    Patient ID: Bethany Lara, female    DOB: [birth year], 74 y.o.   MRN: [id number]  HPI: Bethany Lara is a 74 y.o. female  Chief Complaint  Patient presents with   Office visit    1 week f/u. Patient stated there is still swelling in her feet and legs, it's been constant since her last visit. She is feeling it mostly in her lower back all the way down her legs.   SWELLING Patient seen today to follow up on lower extremity swelling.  Surgery is still scheduled for 1/27.  Due to lower extremity swelling and abnormal ECG, she was seen by Cardiology.  She will undergo a stress test on 1/22 to be cleared for surgery.  She continues to deny chest pain and shortness of breath.  She is ambulating very little due to back pain.   Relevant past medical, surgical, family and social history reviewed and updated as indicated. Interim medical history since our last visit reviewed. Allergies and medications reviewed and updated.  Review of Systems  Respiratory:  Negative for shortness of breath.   Cardiovascular:  Positive for leg swelling. Negative for chest pain.    Per HPI unless specifically indicated above     Objective:    BP 108/74 (BP Location: Right Arm, Patient Position: Sitting, Cuff Size: Large)   Pulse (!) 54   Temp 97.6 F (36.4 C) (Oral)   Ht 5' (1.524 m)   Wt 186 lb (84.4 kg)   LMP  (LMP Unknown)   SpO2 99%   BMI 36.33 kg/m   Wt Readings from Last 3 Encounters:  10/12/24 186 lb (84.4 kg)  10/12/24 186 lb 11.7 oz (84.7 kg)  10/08/24 187 lb (84.8 kg)    Physical Exam Vitals and nursing note reviewed.  Constitutional:      General: She is not in acute distress.    Appearance: Normal appearance. She is normal weight. She is  not ill-appearing, toxic-appearing or diaphoretic.  HENT:     Head: Normocephalic.     Right Ear: External ear normal.     Left Ear: External ear normal.     Nose: Nose normal.     Mouth/Throat:     Mouth: Mucous membranes are moist.     Pharynx: Oropharynx is clear.  Eyes:     General:        Right eye: No discharge.        Left eye: No discharge.     Extraocular Movements: Extraocular movements intact.     Conjunctiva/sclera: Conjunctivae normal.     Pupils: Pupils are equal, round, and reactive to light.  Cardiovascular:     Rate and Rhythm: Normal rate and regular rhythm.     Heart sounds: No murmur heard. Pulmonary:     Effort: Pulmonary effort is normal. No respiratory distress.     Breath sounds: Normal breath sounds. No wheezing or rales.  Musculoskeletal:     Cervical back: Normal range of motion and neck supple.     Right lower leg: 2+ Pitting Edema present.     Left lower leg: 2+ Pitting Edema present.  Skin:    General: Skin is warm and dry.     Capillary Refill: Capillary refill takes less than 2 seconds.  Neurological:     General: No focal deficit present.     Mental Status: She is alert and oriented to person, place, and time. Mental status is at baseline.  Psychiatric:        Mood and Affect: Mood normal.        Behavior: Behavior normal.        Thought Content: Thought content normal.        Judgment: Judgment normal.     Results for orders placed or performed during the hospital encounter of 10/12/24  Urinalysis, Routine w reflex microscopic -Urine, Clean Catch   Collection Time: 10/12/24 10:39 AM  Result Value Ref Range   Color, Urine COLORLESS (A) YELLOW   APPearance CLEAR (A) CLEAR   Specific Gravity, Urine 1.003 (L) 1.005 - 1.030   pH 7.0 5.0 - 8.0   Glucose, UA NEGATIVE NEGATIVE mg/dL   Hgb urine dipstick NEGATIVE NEGATIVE   Bilirubin Urine NEGATIVE NEGATIVE   Ketones, ur NEGATIVE NEGATIVE mg/dL   Protein, ur NEGATIVE NEGATIVE mg/dL    Nitrite NEGATIVE NEGATIVE   Leukocytes,Ua NEGATIVE NEGATIVE   RBC / HPF 0 0 - 5 RBC/hpf   WBC, UA 0-5 0 - 5 WBC/hpf   Bacteria, UA NONE SEEN NONE SEEN   Squamous Epithelial / HPF 0 0 - 5 /HPF      Assessment & Plan:   Problem List Items Addressed This Visit   None Visit Diagnoses       Swelling of lower extremity    -  Primary   Ongoing problem. Seen by Cardiology- likley chronic venous insufficiency. Will have stress test done on 1/22.  Continue with Furosemide  40mg . CMP checked today.        Follow up plan: No follow-ups on file.      "

## 2024-10-12 NOTE — Telephone Encounter (Signed)
 Sorry, about the confusion, but for the zanaflex , she can take every 6 hours as needed, but she still should not take more than 3 pills in 24 hour period.   Does she want to stay on zanaflex  or try another muscle relaxer such as robaxin? Also looks like she has taken flexeril  before, did that help?   Let me know.

## 2024-10-12 NOTE — Telephone Encounter (Signed)
 Bethany Lara is out of the office today.   She is scheduled for surgery on 10/20/24.   PMP reviewed. Okay for refill of oxycodone . Remind her to take the least amount possible prior to surgery.   Make sure she is not taking flexeril  (it's on her list)- if not, remove it.   For zanaflex , she should try to take only every 8 hours but can do every 6 hours when needed. She has refill at pharmacy.   Let me know if they won't fill zanaflex  refrill- we can always cancel them and call in script with new directions.

## 2024-10-12 NOTE — Patient Instructions (Signed)
 Please call to schedule your mammogram and/or bone density: [facility] at [facility]  Address: [address], [place], [place] [address] Phone: [phone]  [facility] at [facility] [address] [place],  [place]  [address] Phone: [phone]

## 2024-10-12 NOTE — Addendum Note (Signed)
 Addended byBETHA HILMA HASTINGS on: 10/12/2024 04:10 PM   Modules accepted: Orders

## 2024-10-12 NOTE — Patient Instructions (Addendum)
 Your procedure is scheduled on: 10/20/24 - Tuesday Report to the Registration Desk on the 1st floor of the [facility]. To find out your arrival time, please call [phone] between 1PM - 3PM on: 10/19/24 - Monday If your arrival time is 6:00 am, do not arrive before that time as the [facility] entrance doors do not open until 6:00 am.  REMEMBER: Instructions that are not followed completely may result in serious medical risk, up to and including death; or upon the discretion of your surgeon and anesthesiologist your surgery may need to be rescheduled.  Do not eat food after midnight the night before surgery.  No gum chewing or hard candies.  You may however, drink CLEAR liquids up to 2 hours before you are scheduled to arrive for your surgery. Do not drink anything within 2 hours of your scheduled arrival time.  Clear liquids include: - water   - apple juice without pulp - gatorade (not RED colors) - black coffee or tea (Do NOT add milk or creamers to the coffee or tea) Do NOT drink anything that is not on this list.  Stop ANY OVER THE COUNTER supplements until after surgery.  You may continue to take Tylenol  , Oxycodone , and any prescribed pain medication if needed for pain up until the day of surgery.  We have instructed the patient to hold their blood thinner for surgery as listed below: Plavix  (clopidogrel ): Stop Plavix  7 days prior, resume Plavix  14 days after    ON THE DAY OF SURGERY ONLY TAKE THESE MEDICATIONS WITH SIPS OF WATER :  You may continue to take Tylenol  , Oxycodone , and any prescribed pain medication if needed for pain up until the day of surgery.   No Alcohol for 24 hours before or after surgery.  No Smoking including e-cigarettes for 24 hours before surgery.  No chewable tobacco products for at least 6 hours before surgery.  No nicotine patches on the day of surgery.  Do not use any recreational drugs for at least a week (preferably 2 weeks) before  your surgery.  Please be advised that the combination of cocaine and anesthesia may have negative outcomes, up to and including death. If you test positive for cocaine, your surgery will be cancelled.  On the morning of surgery brush your teeth with toothpaste and water , you may rinse your mouth with mouthwash if you wish. Do not swallow any toothpaste or mouthwash.  Use CHG Soap or wipes as directed on instruction sheet.  Do not wear jewelry, make-up, hairpins, clips or nail polish.  For welded (permanent) jewelry: bracelets, anklets, waist bands, etc.  Please have this removed prior to surgery.  If it is not removed, there is a chance that hospital personnel will need to cut it off on the day of surgery.  Do not wear lotions, powders, or perfumes.   Do not shave body hair from the neck down 48 hours before surgery.  Contact lenses, hearing aids and dentures may not be worn into surgery.  Do not bring valuables to the hospital. [facility] is not responsible for any missing/lost belongings or valuables.   Bring your C-PAP to the hospital in case you may have to spend the night.   Notify your doctor if there is any change in your medical condition (cold, fever, infection).  Wear comfortable clothing (specific to your surgery type) to the hospital.  After surgery, you can help prevent lung complications by doing breathing exercises.  Take deep breaths and cough  every 1-2 hours. Your doctor may order a device called an Incentive Spirometer to help you take deep breaths.  If you are being admitted to the hospital overnight, leave your suitcase in the car. After surgery it may be brought to your room.  In case of increased patient census, it may be necessary for you, the patient, to continue your postoperative care in the Same Day Surgery department.  If you are being discharged the day of surgery, you will not be allowed to drive home. You will need a responsible individual to drive  you home and stay with you for 24 hours after surgery.   If you are taking public transportation, you will need to have a responsible individual with you.  Please call the Pre-admissions Testing Dept. at [phone] if you have any questions about these instructions.  Surgery Visitation Policy:  Patients having surgery or a procedure may have two visitors.  Children under the age of 54 must have an adult with them who is not the patient.  Inpatient Visitation:    Visiting hours are 7 a.m. to 8 p.m. Up to four visitors are allowed at one time in a patient room. The visitors may rotate out with other people during the day.  One visitor age 61 or older may stay with the patient overnight and must be in the room by 8 p.m.   Merchandiser, Retail to address health-related social needs:  [email or web address]    Pre-operative 4 CHG Bath Instructions   You can play a key role in reducing the risk of infection after surgery. Your skin needs to be as free of germs as possible. You can reduce the number of germs on your skin by washing with CHG (chlorhexidine gluconate) soap before surgery. CHG is an antiseptic soap that kills germs and continues to kill germs even after washing.   DO NOT use if you have an allergy to chlorhexidine/CHG or antibacterial soaps. If your skin becomes reddened or irritated, stop using the CHG and notify one of our RNs at [phone].   Please shower with the CHG soap starting 4 days before surgery using the following schedule: 01/23 - 01/27.    Please keep in mind the following:  DO NOT shave, including legs and underarms, starting the day of your first shower.   You may shave your face at any point before/day of surgery.  Place clean sheets on your bed the day you start using CHG soap. Use a clean washcloth (not used since being washed) for each shower. DO NOT sleep with pets once you start using the CHG.   CHG Shower Instructions:  If  you choose to wash your hair and private area, wash first with your normal shampoo/soap.  After you use shampoo/soap, rinse your hair and body thoroughly to remove shampoo/soap residue.  Turn the water  OFF and apply about 3 tablespoons (45 ml) of CHG soap to a CLEAN washcloth.  Apply CHG soap ONLY FROM YOUR NECK DOWN TO YOUR TOES (washing for 3-5 minutes)  DO NOT use CHG soap on face, private areas, open wounds, or sores.  Pay special attention to the area where your surgery is being performed.  If you are having back surgery, having someone wash your back for you may be helpful. Wait 2 minutes after CHG soap is applied, then you may rinse off the CHG soap.  Pat dry with a clean towel  Put on clean clothes/pajamas   If you choose to wear lotion,  please use ONLY the CHG-compatible lotions on the back of this paper.     Additional instructions for the day of surgery: DO NOT APPLY any lotions, deodorants, cologne, or perfumes.   Put on clean/comfortable clothes.  Brush your teeth.  Ask your nurse before applying any prescription medications to the skin.      CHG Compatible Lotions   Aveeno Moisturizing lotion  Cetaphil Moisturizing Cream  Cetaphil Moisturizing Lotion  Clairol Herbal Essence Moisturizing Lotion, Dry Skin  Clairol Herbal Essence Moisturizing Lotion, Extra Dry Skin  Clairol Herbal Essence Moisturizing Lotion, Normal Skin  Curel Age Defying Therapeutic Moisturizing Lotion with Alpha Hydroxy  Curel Extreme Care Body Lotion  Curel Soothing Hands Moisturizing Hand Lotion  Curel Therapeutic Moisturizing Cream, Fragrance-Free  Curel Therapeutic Moisturizing Lotion, Fragrance-Free  Curel Therapeutic Moisturizing Lotion, Original Formula  Eucerin Daily Replenishing Lotion  Eucerin Dry Skin Therapy Plus Alpha Hydroxy Crme  Eucerin Dry Skin Therapy Plus Alpha Hydroxy Lotion  Eucerin Original Crme  Eucerin Original Lotion  Eucerin Plus Crme Eucerin Plus Lotion  Eucerin  TriLipid Replenishing Lotion  Keri Anti-Bacterial Hand Lotion  Keri Deep Conditioning Original Lotion Dry Skin Formula Softly Scented  Keri Deep Conditioning Original Lotion, Fragrance Free Sensitive Skin Formula  Keri Lotion Fast Absorbing Fragrance Free Sensitive Skin Formula  Keri Lotion Fast Absorbing Softly Scented Dry Skin Formula  Keri Original Lotion  Keri Skin Renewal Lotion Keri Silky Smooth Lotion  Keri Silky Smooth Sensitive Skin Lotion  Nivea Body Creamy Conditioning Oil  Nivea Body Extra Enriched Lotion  Nivea Body Original Lotion  Nivea Body Sheer Moisturizing Lotion Nivea Crme  Nivea Skin Firming Lotion  NutraDerm 30 Skin Lotion  NutraDerm Skin Lotion  NutraDerm Therapeutic Skin Cream  NutraDerm Therapeutic Skin Lotion  ProShield Protective Hand Cream  Provon moisturizing lotion  How to Use an Incentive Spirometer  An incentive spirometer is a tool that measures how well you are filling your lungs with each breath. Learning to take long, deep breaths using this tool can help you keep your lungs clear and active. This may help to reverse or lessen your chance of developing breathing (pulmonary) problems, especially infection. You may be asked to use a spirometer: After a surgery. If you have a lung problem or a history of smoking. After a long period of time when you have been unable to move or be active. If the spirometer includes an indicator to show the highest number that you have reached, your health care provider or respiratory therapist will help you set a goal. Keep a log of your progress as told by your health care provider. What are the risks? Breathing too quickly may cause dizziness or cause you to pass out. Take your time so you do not get dizzy or light-headed. If you are in pain, you may need to take pain medicine before doing incentive spirometry. It is harder to take a deep breath if you are having pain. How to use your incentive spirometer  Sit up on  the edge of your bed or on a chair. Hold the incentive spirometer so that it is in an upright position. Before you use the spirometer, breathe out normally. Place the mouthpiece in your mouth. Make sure your lips are closed tightly around it. Breathe in slowly and as deeply as you can through your mouth, causing the piston or the ball to rise toward the top of the chamber. Hold your breath for 3-5 seconds, or for as long as possible. If  the spirometer includes a coach indicator, use this to guide you in breathing. Slow down your breathing if the indicator goes above the marked areas. Remove the mouthpiece from your mouth and breathe out normally. The piston or ball will return to the bottom of the chamber. Rest for a few seconds, then repeat the steps 10 or more times. Take your time and take a few normal breaths between deep breaths so that you do not get dizzy or light-headed. Do this every 1-2 hours when you are awake. If the spirometer includes a goal marker to show the highest number you have reached (best effort), use this as a goal to work toward during each repetition. After each set of 10 deep breaths, cough a few times. This will help to make sure that your lungs are clear. If you have an incision on your chest or abdomen from surgery, place a pillow or a rolled-up towel firmly against the incision when you cough. This can help to reduce pain while taking deep breaths and coughing. General tips When you are able to get out of bed: Walk around often. Continue to take deep breaths and cough in order to clear your lungs. Keep using the incentive spirometer until your health care provider says it is okay to stop using it. If you have been in the hospital, you may be told to keep using the spirometer at home. Contact a health care provider if: You are having difficulty using the spirometer. You have trouble using the spirometer as often as instructed. Your pain medicine is not giving  enough relief for you to use the spirometer as told. You have a fever. Get help right away if: You develop shortness of breath. You develop a cough with bloody mucus from the lungs. You have fluid or blood coming from an incision site after you cough. Summary An incentive spirometer is a tool that can help you learn to take long, deep breaths to keep your lungs clear and active. You may be asked to use a spirometer after a surgery, if you have a lung problem or a history of smoking, or if you have been inactive for a long period of time. Use your incentive spirometer as instructed every 1-2 hours while you are awake. If you have an incision on your chest or abdomen, place a pillow or a rolled-up towel firmly against your incision when you cough. This will help to reduce pain. Get help right away if you have shortness of breath, you cough up bloody mucus, or blood comes from your incision when you cough. This information is not intended to replace advice given to you by your health care provider. Make sure you discuss any questions you have with your health care provider. Document Revised: 11/30/2019 Document Reviewed: 11/30/2019 Elsevier Patient Education  2023 [employer].

## 2024-10-12 NOTE — Addendum Note (Signed)
 Addended byBETHA HILMA HASTINGS on: 10/12/2024 03:41 PM   Modules accepted: Orders

## 2024-10-12 NOTE — Telephone Encounter (Signed)
 I did not call patient yet, but called [facility] and they are not able to fill RX for Tizanidine  yet because it is too early. They cancelled RX out of her chart for the 4 mg 1 every 8 hours as needed and I advised them we would send an updated RX for every 6 hours to add to the chart

## 2024-10-12 NOTE — Telephone Encounter (Signed)
 I spoke to patient and she would like to have a refill of the Flexeril . She got a 15 day supply on 09/22/2024. She can't have tizanidine  until January 30. It was last filled on 09/22/2024 for a 30 day supply.  She has taken to many and they won't fill it early.

## 2024-10-12 NOTE — Telephone Encounter (Signed)
 Spoke with patient. She has been taking flexeril  10mg  q hs.   She will stop tizanidine  and do flexeril  10mg  q 8 hours prn muscle spasms. We discussed that this can make her sleepy.   Flexeril  sent to pharmacy and she is aware.

## 2024-10-13 ENCOUNTER — Ambulatory Visit: Payer: Self-pay

## 2024-10-13 ENCOUNTER — Telehealth: Payer: Self-pay | Admitting: Physician Assistant

## 2024-10-13 ENCOUNTER — Ambulatory Visit: Payer: Self-pay | Admitting: Nurse Practitioner

## 2024-10-13 ENCOUNTER — Encounter (HOSPITAL_COMMUNITY): Payer: Self-pay

## 2024-10-13 DIAGNOSIS — R059 Cough, unspecified: Secondary | ICD-10-CM | POA: Diagnosis present

## 2024-10-13 DIAGNOSIS — D72829 Elevated white blood cell count, unspecified: Secondary | ICD-10-CM | POA: Diagnosis not present

## 2024-10-13 DIAGNOSIS — J069 Acute upper respiratory infection, unspecified: Secondary | ICD-10-CM | POA: Insufficient documentation

## 2024-10-13 LAB — COMPREHENSIVE METABOLIC PANEL WITH GFR
ALT: 21 IU/L (ref 0–32)
AST: 14 IU/L (ref 0–40)
Albumin: 4.3 g/dL (ref 3.8–4.8)
Alkaline Phosphatase: 94 IU/L (ref 49–135)
BUN/Creatinine Ratio: 18 (ref 12–28)
BUN: 18 mg/dL (ref 8–27)
Bilirubin Total: 0.5 mg/dL (ref 0.0–1.2)
CO2: 23 mmol/L (ref 20–29)
Calcium: 9.7 mg/dL (ref 8.7–10.3)
Chloride: 100 mmol/L (ref 96–106)
Creatinine, Ser: 1.01 mg/dL — ABNORMAL HIGH (ref 0.57–1.00)
Globulin, Total: 2 g/dL (ref 1.5–4.5)
Glucose: 105 mg/dL — ABNORMAL HIGH (ref 70–99)
Potassium: 3.9 mmol/L (ref 3.5–5.2)
Sodium: 140 mmol/L (ref 134–144)
Total Protein: 6.3 g/dL (ref 6.0–8.5)
eGFR: 58 mL/min/1.73 — ABNORMAL LOW

## 2024-10-13 NOTE — Telephone Encounter (Signed)
 You last saw her on 09/30/24. Scheduled for Right L5-S1 Hemilaminectomy and medial facetectomy for Synovial Cyst Resection on 10/20/24 with Dr. Claudene.   Patient calling with concern for feeling shaky and anxious and increased BP and HR. She believes it may have something to do with her medications. Husband took BP 179/114, HR 101.   Looks like she was previously taking flexeril  10mg  q hs. [name] switched her from taking zanaflex  Q6 during the day to flexeril  Q8 when she called asking for refill due to challenges getting scripts filled at pharmacy.   Lower back is hurting 6/10.   Rash on legs and feet - started about a week ago - she saw her PCP and told her about this yesterday but she states the PCP did not look at it. Legs swollen - started 2 weeks ago- PCP states cardiology is not concerned about this  Current Regimen: Tylenol  - 1000 mg every 8 hours Cyclobenzaprine  - 10mg  Q8 hours Lyrica  - prn at night Oxycodone  - has not taken any but states she took a dose last night  Took all these meds last night and something made her BP go up. Nervous and shaky.   Pharmacy: [facility] in [place]  Advised her to reach out to her PCP, but do you have any additional thoughts/suggestions?       Patient is calling to let our office know that she started the Flexeril  last night and that she started not feeling well. She states that she feels nervous and shaky and her BP reading was 176/95 and took it again and it was even higher.

## 2024-10-13 NOTE — ED Triage Notes (Incomplete)
 Pt arrives via EMS from home

## 2024-10-13 NOTE — Telephone Encounter (Signed)
 FYI Only or Action Required?: FYI only for provider: appointment scheduled on 10/14/24.  Patient was last seen in primary care on 10/12/2024 by Melvin Pao, NP.  Called Nurse Triage reporting Hypertension.  Symptoms began today.  Interventions attempted: Rest, hydration, or home remedies.  Symptoms are: stable.  Triage Disposition: See Physician Within 24 Hours  Patient/caregiver understands and will follow disposition?: Yes                                  1. BLOOD PRESSURE: What is your blood pressure? Did you take at least two measurements 5 minutes apart?     177/93, HR 65 while on phone with this RN, 186/102 at one point today  2. ONSET: When did you take your blood pressure?     While on phone with this RN 3. HOW: How did you take your blood pressure? (e.g., automatic home BP monitor, visiting nurse)     Automatic cuff at home 4. HISTORY: Do you have a history of high blood pressure?     Denies 5. MEDICINES: Are you taking any medicines for blood pressure? Have you missed any doses recently?     Denies 6. OTHER SYMPTOMS: Do you have any symptoms? (e.g., blurred vision, chest pain, difficulty breathing, headache, weakness)     Very slight headache, back pain, rash on feet and legs (present at OV yesterday), nervous/shaky Denies: chest pain, difficulty breathing, weakness/numbness, severe headache, blurred vision    Patient scheduled with PCP tomorrow morning. Patient was advised by neurosurgery to follow-up with PCP and hold muscle relaxants (see chart). Patient agreed to call back if symptoms worsen.   Reason for Disposition  Systolic BP >= 180 OR Diastolic >= 110  Protocols used: Blood Pressure - High-A-AH  Copied from CRM #[id number]. Topic: Clinical - Medical Advice >> Oct 13, 2024  3:24 PM Travis F wrote: Reason for CRM: Patient is calling in because her blood pressure is high. Patient says it is 186/100, patient  says she took Lasix  and Tylenol  around 7  and she took cyclobenzaprine  at 11 and her pressure is running high and she's not used to it running high. She would like some advice on what to do. Patient says her lower back is also hurting and she took a half of oxycodone  just now for her back.

## 2024-10-13 NOTE — Telephone Encounter (Signed)
 Patient is calling to let our office know that she started the Flexeril  last night and that she started not feeling well. She states that she feels nervous and shaky and her BP reading was 176/95 and took it again and it was even higher.

## 2024-10-13 NOTE — ED Triage Notes (Signed)
 Pt arrives via EMS from home; st since this morning having runny nose & prod cough with chills; 2 tylenol  taken PTA without relief

## 2024-10-14 ENCOUNTER — Ambulatory Visit: Admitting: Nurse Practitioner

## 2024-10-14 ENCOUNTER — Encounter: Payer: Self-pay | Admitting: Nurse Practitioner

## 2024-10-14 ENCOUNTER — Other Ambulatory Visit: Payer: Self-pay

## 2024-10-14 ENCOUNTER — Encounter: Payer: Self-pay | Admitting: Emergency Medicine

## 2024-10-14 ENCOUNTER — Other Ambulatory Visit: Payer: Self-pay | Admitting: *Deleted

## 2024-10-14 ENCOUNTER — Emergency Department

## 2024-10-14 ENCOUNTER — Emergency Department
Admission: EM | Admit: 2024-10-14 | Discharge: 2024-10-14 | Disposition: A | Attending: Emergency Medicine | Admitting: Emergency Medicine

## 2024-10-14 VITALS — BP 204/78 | HR 67 | Temp 98.3°F | Resp 17 | Ht 60.0 in | Wt 183.0 lb

## 2024-10-14 DIAGNOSIS — J069 Acute upper respiratory infection, unspecified: Secondary | ICD-10-CM

## 2024-10-14 DIAGNOSIS — R030 Elevated blood-pressure reading, without diagnosis of hypertension: Secondary | ICD-10-CM | POA: Insufficient documentation

## 2024-10-14 LAB — COMPREHENSIVE METABOLIC PANEL WITH GFR
ALT: 19 U/L (ref 0–44)
AST: 21 U/L (ref 15–41)
Albumin: 4.6 g/dL (ref 3.5–5.0)
Alkaline Phosphatase: 102 U/L (ref 38–126)
Anion gap: 15 (ref 5–15)
BUN: 11 mg/dL (ref 8–23)
CO2: 26 mmol/L (ref 22–32)
Calcium: 10.2 mg/dL (ref 8.9–10.3)
Chloride: 98 mmol/L (ref 98–111)
Creatinine, Ser: 0.72 mg/dL (ref 0.44–1.00)
GFR, Estimated: >60 mL/min
Glucose, Bld: 154 mg/dL — ABNORMAL HIGH (ref 70–99)
Potassium: 3.5 mmol/L (ref 3.5–5.1)
Sodium: 139 mmol/L (ref 135–145)
Total Bilirubin: 0.7 mg/dL (ref 0.0–1.2)
Total Protein: 7.3 g/dL (ref 6.5–8.1)

## 2024-10-14 LAB — RESP PANEL BY RT-PCR (RSV, FLU A&B, COVID)  RVPGX2
Influenza A by PCR: NEGATIVE
Influenza B by PCR: NEGATIVE
Resp Syncytial Virus by PCR: NEGATIVE
SARS Coronavirus 2 by RT PCR: NEGATIVE

## 2024-10-14 LAB — CBC WITH DIFFERENTIAL/PLATELET
Abs Immature Granulocytes: 0.04 K/uL (ref 0.00–0.07)
Basophils Absolute: 0.1 K/uL (ref 0.0–0.1)
Basophils Relative: 1 %
Eosinophils Absolute: 0 K/uL (ref 0.0–0.5)
Eosinophils Relative: 0 %
HCT: 47.1 % — ABNORMAL HIGH (ref 36.0–46.0)
Hemoglobin: 16.3 g/dL — ABNORMAL HIGH (ref 12.0–15.0)
Immature Granulocytes: 0 %
Lymphocytes Relative: 9 %
Lymphs Abs: 1.1 K/uL (ref 0.7–4.0)
MCH: 31.4 pg (ref 26.0–34.0)
MCHC: 34.6 g/dL (ref 30.0–36.0)
MCV: 90.8 fL (ref 80.0–100.0)
Monocytes Absolute: 0.4 K/uL (ref 0.1–1.0)
Monocytes Relative: 3 %
Neutro Abs: 9.7 K/uL — ABNORMAL HIGH (ref 1.7–7.7)
Neutrophils Relative %: 87 %
Platelets: 286 K/uL (ref 150–400)
RBC: 5.19 MIL/uL — ABNORMAL HIGH (ref 3.87–5.11)
RDW: 12.6 % (ref 11.5–15.5)
WBC: 11.3 K/uL — ABNORMAL HIGH (ref 4.0–10.5)
nRBC: 0 % (ref 0.0–0.2)

## 2024-10-14 LAB — TROPONIN T, HIGH SENSITIVITY: Troponin T High Sensitivity: 11 ng/L (ref 0–19)

## 2024-10-14 MED ORDER — TIZANIDINE HCL 4 MG PO TABS: 4.0000 mg | ORAL_TABLET | Freq: Four times a day (QID) | ORAL | 0 refills | Status: DC | PRN

## 2024-10-14 NOTE — Assessment & Plan Note (Addendum)
 Likely an acute problem secondary to Tizanidine  withdrawal.  Upon chart review, patient was changed from Tizanidine  to Cyclobenzaprine  due to worsening pain.  She has not been taking this due to it making her feel off and have elevated BP.  She was taking the Tizanidine  more often than prescribed and is currently out of medication.  Prior to being out of medication, blood pressure was well controlled.  Will restart Tizanidine  and follow up with patient on Friday to ensure blood pressure is resolving.  Reviewed s/s to monitor for and when to return to the ER.  Patient does have an appointment for a stress test tomorrow with Cardiology.

## 2024-10-14 NOTE — ED Provider Notes (Signed)
 "  [facility] Provider Note    Event Date/Time   First MD Initiated Contact with Patient 10/14/24 0157     (approximate)   History   Nasal Congestion   HPI  Bethany Lara is a 74 y.o. female   Past medical history of hyperlipidemia, sleep apnea, depression, history of CVA here with 1 day of respiratory infectious symptoms of cough, congestion, and chills.  Of note has an upcoming back surgery and has been on muscle relaxant tizanidine  and ran out 2 days ago.  No alcohol. No other substance use.  Narcotics intermittently.  Also with vague left-sided chest discomfort.  No significant shortness of breath.  Vomited once.    Status post cholecystectomy.  No significant abdominal pain.  Constipated but making bowel movement, last 1 was yesterday, passing gas.     External Medical Documents Reviewed: Previous outpatient notes      Physical Exam   Triage Vital Signs: ED Triage Vitals  Encounter Vitals Group     BP 10/14/24 0015 (!) 187/70     Girls Systolic BP Percentile --      Girls Diastolic BP Percentile --      Boys Systolic BP Percentile --      Boys Diastolic BP Percentile --      Pulse Rate 10/14/24 0015 63     Resp 10/14/24 0015 18     Temp 10/14/24 0015 97.7 F (36.5 C)     Temp Source 10/14/24 0015 Oral     SpO2 10/14/24 0015 97 %     Weight 10/14/24 0018 189 lb (85.7 kg)     Height 10/14/24 0018 5' (1.524 m)     Head Circumference --      Peak Flow --      Pain Score 10/14/24 0018 0     Pain Loc --      Pain Education --      Exclude from Growth Chart --     Most recent vital signs: Vitals:   10/14/24 0500 10/14/24 0518  BP: (!) 197/83   Pulse: 62   Resp: 19   Temp:  97.8 F (36.6 C)  SpO2: 97%     General: Awake, no distress.  CV:  Good peripheral perfusion.  Resp:  Normal effort.  Abd:  No distention.  Other:  Awake alert comfortable.  Hypertensive otherwise vital signs normal breathing comfortably on room air.   No focality or wheezing on auscultation of the lungs.  Benign abdominal exam without any focal or peritoneal signs.  Neck supple full range of motion, afebrile.  Heart sounds normal, bilateral lower extremity edema   ED Results / Procedures / Treatments   Labs (all labs ordered are listed, but only abnormal results are displayed) Labs Reviewed  CBC WITH DIFFERENTIAL/PLATELET - Abnormal; Notable for the following components:      Result Value   WBC 11.3 (*)    RBC 5.19 (*)    Hemoglobin 16.3 (*)    HCT 47.1 (*)    Neutro Abs 9.7 (*)    All other components within normal limits  COMPREHENSIVE METABOLIC PANEL WITH GFR - Abnormal; Notable for the following components:   Glucose, Bld 154 (*)    All other components within normal limits  RESP PANEL BY RT-PCR (RSV, FLU A&B, COVID)  [id number]  TROPONIN T, HIGH SENSITIVITY  TROPONIN T, HIGH SENSITIVITY     I ordered and reviewed the above labs they are notable for  cell counts electrolytes viral testing and serial troponins unremarkable.  EKG  ED ECG REPORT I, Ginnie Shams, the attending physician, personally viewed and interpreted this ECG.   Date: 10/14/2024  EKG Time: 0413  Rate: 97  Rhythm: sinus  Axis: nl  Intervals:long QT and PR  ST&T Change: no stemi    RADIOLOGY I independently reviewed and interpreted chest x-ray see no obvious focality or pneumothorax I also reviewed radiologist's formal read.   PROCEDURES:  Critical Care performed: No  Procedures   MEDICATIONS ORDERED IN ED: Medications - No data to display   IMPRESSION / MDM / ASSESSMENT AND PLAN / ED COURSE  I reviewed the triage vital signs and the nursing notes.                                Patient's presentation is most consistent with acute presentation with potential threat to life or bodily function.  Differential diagnosis includes, but is not limited to, respiratory infection, viral infection, bacterial pneumonia, considered but less likely  PE or ACS   The patient is on the cardiac monitor to evaluate for evidence of arrhythmia and/or significant heart rate changes.  MDM:    Patient presenting with respiratory infectious symptoms with cough, congestion, chills for 1 day.  Nontoxic-appearing, no focality on lung exam nor on chest x-ray doubt bacterial source nor sepsis at this time, most likely viral.  With vague chest pain given her age and some comorbidities check for ACS to be very nontypical and does not match with any other acute cardiopulmonary emergencies like PE or dissection..  Fortunately EKG is nonischemic and serial troponins have been normal.  I considered hospitalization for admission or observation however given her stability in the emergency department suspicion for viral illness with an otherwise unremarkable exam and clinical evaluation as above, I think outpatient management at this time and PMD follow-up is most appropriate.  Return precautions were given.        FINAL CLINICAL IMPRESSION(S) / ED DIAGNOSES   Final diagnoses:  Upper respiratory tract infection, unspecified type     Rx / DC Orders   ED Discharge Orders     None        Note:  This document was prepared using Dragon voice recognition software and may include unintentional dictation errors.    Shams Ginnie, MD 10/14/24 [phone]  "

## 2024-10-14 NOTE — Progress Notes (Signed)
 "  BP (!) 204/78 (BP Location: Left Arm, Patient Position: Sitting, Cuff Size: Normal)   Pulse 67   Temp 98.3 F (36.8 C) (Oral)   Resp 17   Ht 5' (1.524 m)   Wt 183 lb (83 kg)   LMP  (LMP Unknown)   SpO2 99%   BMI 35.74 kg/m    Subjective:    Patient ID: Bethany Lara, female    DOB: 07-09-1951, 74 y.o.   MRN: [id number]  HPI: Bethany Lara is a 74 y.o. female  Chief Complaint  Patient presents with   Hypertension    Says since starting medication (oxy and Flexeril ) her pressures have been running very high. Did go to ED last night was close to 200. Did have some nausea but no blurry vision, double vision.    Rash    Both legs. Says the swelling is from the rash as she has been sitting a lot due to back pain.    URI    Does mention she has had nausea, runny nose, throwing up, minor cough and vomiting.     Patient went to the ER last night due to feeling terrible.  States the high blood pressure is what is making her feel terrible.  In the ER tizanidine .  She states she is not taking cyclobenzaprine  that she took it 1-2 times and it made her blood pressure go really high.  States someone at the neurosurgery office told her not to take it anymore.  She was taking the Tizanidine  more often than it was prescribed and ran out of the medication. Since then she has felt off, shaky, elevated BP and HR at home.  Relevant past medical, surgical, family and social history reviewed and updated as indicated. Interim medical history since our last visit reviewed. Allergies and medications reviewed and updated.  Review of Systems  Constitutional:  Positive for fatigue.  Neurological:  Positive for weakness.       Shaky    Per HPI unless specifically indicated above     Objective:    BP (!) 204/78 (BP Location: Left Arm, Patient Position: Sitting, Cuff Size: Normal)   Pulse 67   Temp 98.3 F (36.8 C) (Oral)   Resp 17   Ht 5' (1.524 m)   Wt 183 lb (83 kg)   LMP  (LMP  Unknown)   SpO2 99%   BMI 35.74 kg/m   Wt Readings from Last 3 Encounters:  10/14/24 183 lb (83 kg)  10/14/24 189 lb (85.7 kg)  10/12/24 186 lb 11.7 oz (84.7 kg)    Physical Exam Vitals and nursing note reviewed.  Constitutional:      General: She is not in acute distress.    Appearance: Normal appearance. She is normal weight. She is not ill-appearing, toxic-appearing or diaphoretic.  HENT:     Head: Normocephalic.     Right Ear: External ear normal.     Left Ear: External ear normal.     Nose: Nose normal.     Mouth/Throat:     Mouth: Mucous membranes are moist.     Pharynx: Oropharynx is clear.  Eyes:     General:        Right eye: No discharge.        Left eye: No discharge.     Extraocular Movements: Extraocular movements intact.     Conjunctiva/sclera: Conjunctivae normal.     Pupils: Pupils are equal, round, and reactive to light.  Cardiovascular:  Rate and Rhythm: Normal rate and regular rhythm.     Heart sounds: No murmur heard. Pulmonary:     Effort: Pulmonary effort is normal. No respiratory distress.     Breath sounds: Normal breath sounds. No wheezing or rales.  Musculoskeletal:     Cervical back: Normal range of motion and neck supple.     Right lower leg: 2+ Pitting Edema present.     Left lower leg: 2+ Pitting Edema present.  Skin:    General: Skin is warm and dry.     Capillary Refill: Capillary refill takes less than 2 seconds.  Neurological:     General: No focal deficit present.     Mental Status: She is alert and oriented to person, place, and time. Mental status is at baseline.  Psychiatric:        Mood and Affect: Mood normal.        Behavior: Behavior normal.        Thought Content: Thought content normal.        Judgment: Judgment normal.     Results for orders placed or performed during the hospital encounter of 10/14/24  Resp panel by RT-PCR (RSV, Flu A&B, Covid) Anterior Nasal Swab   Collection Time: 10/14/24 12:21 AM   Specimen:  Anterior Nasal Swab  Result Value Ref Range   SARS Coronavirus 2 by RT PCR NEGATIVE NEGATIVE   Influenza A by PCR NEGATIVE NEGATIVE   Influenza B by PCR NEGATIVE NEGATIVE   Resp Syncytial Virus by PCR NEGATIVE NEGATIVE  CBC with Differential   Collection Time: 10/14/24  3:39 AM  Result Value Ref Range   WBC 11.3 (H) 4.0 - 10.5 K/uL   RBC 5.19 (H) 3.87 - 5.11 MIL/uL   Hemoglobin 16.3 (H) 12.0 - 15.0 g/dL   HCT 52.8 (H) 63.9 - 53.9 %   MCV 90.8 80.0 - 100.0 fL   MCH 31.4 26.0 - 34.0 pg   MCHC 34.6 30.0 - 36.0 g/dL   RDW 87.3 88.4 - 84.4 %   Platelets 286 150 - 400 K/uL   nRBC 0.0 0.0 - 0.2 %   Neutrophils Relative % 87 %   Neutro Abs 9.7 (H) 1.7 - 7.7 K/uL   Lymphocytes Relative 9 %   Lymphs Abs 1.1 0.7 - 4.0 K/uL   Monocytes Relative 3 %   Monocytes Absolute 0.4 0.1 - 1.0 K/uL   Eosinophils Relative 0 %   Eosinophils Absolute 0.0 0.0 - 0.5 K/uL   Basophils Relative 1 %   Basophils Absolute 0.1 0.0 - 0.1 K/uL   Immature Granulocytes 0 %   Abs Immature Granulocytes 0.04 0.00 - 0.07 K/uL  Comprehensive metabolic panel with GFR   Collection Time: 10/14/24  3:59 AM  Result Value Ref Range   Sodium 139 135 - 145 mmol/L   Potassium 3.5 3.5 - 5.1 mmol/L   Chloride 98 98 - 111 mmol/L   CO2 26 22 - 32 mmol/L   Glucose, Bld 154 (H) 70 - 99 mg/dL   BUN 11 8 - 23 mg/dL   Creatinine, Ser 9.27 0.44 - 1.00 mg/dL   Calcium  10.2 8.9 - 10.3 mg/dL   Total Protein 7.3 6.5 - 8.1 g/dL   Albumin 4.6 3.5 - 5.0 g/dL   AST 21 15 - 41 U/L   ALT 19 0 - 44 U/L   Alkaline Phosphatase 102 38 - 126 U/L   Total Bilirubin 0.7 0.0 - 1.2 mg/dL   GFR, Estimated >39 >  60 mL/min   Anion gap 15 5 - 15  Troponin T, High Sensitivity   Collection Time: 10/14/24  3:59 AM  Result Value Ref Range   Troponin T High Sensitivity 11 0 - 19 ng/L      Assessment & Plan:   Problem List Items Addressed This Visit       Other   Elevated blood pressure reading - Primary   Likely an acute problem secondary to  Tizanidine  withdrawal.  Upon chart review, patient was changed from Tizanidine  to Cyclobenzaprine  due to worsening pain.  She has not been taking this due to it making her feel off and have elevated BP.  She was taking the Tizanidine  more often than prescribed and is currently out of medication.  Prior to being out of medication, blood pressure was well controlled.  Will restart Tizanidine  and follow up with patient on Friday to ensure blood pressure is resolving.  Reviewed s/s to monitor for and when to return to the ER.  Patient does have an appointment for a stress test tomorrow with Cardiology.         Follow up plan: Return in about 2 days (around 10/16/2024) for Friday at 11 with Dr. Vicci.  A total of 30 minutes were spent on this encounter today.  When total time is documented, this includes both the face-to-face and non-face-to-face time personally spent before, during and after the visit on the date of the encounter reviewing chart, discussing symptoms, plan of care and follow up.      "

## 2024-10-14 NOTE — Discharge Instructions (Signed)
 Fortunately your evaluation in the emergency department did not show any emergent conditions account for your symptoms.  I think that you are suffering from a viral illness.  Talk to your doctor about your muscle relaxant and how coming off of this medication may be also contributing to symptoms  Thank you for choosing us  for your health care today!  Please see your primary doctor this week for a follow up appointment.   If you have any new, worsening, or unexpected symptoms call your doctor right away or come back to the emergency department for reevaluation.  It was my pleasure to care for you today.   Ginnie EDISON Cyrena, MD

## 2024-10-15 ENCOUNTER — Ambulatory Visit
Admission: RE | Admit: 2024-10-15 | Discharge: 2024-10-15 | Disposition: A | Discharge status: Home / Self Care | Source: Ambulatory Visit

## 2024-10-15 DIAGNOSIS — Z01810 Encounter for preprocedural cardiovascular examination: Secondary | ICD-10-CM | POA: Diagnosis not present

## 2024-10-15 DIAGNOSIS — I700 Atherosclerosis of aorta: Secondary | ICD-10-CM | POA: Insufficient documentation

## 2024-10-15 DIAGNOSIS — R911 Solitary pulmonary nodule: Secondary | ICD-10-CM | POA: Diagnosis not present

## 2024-10-15 DIAGNOSIS — R9431 Abnormal electrocardiogram [ECG] [EKG]: Secondary | ICD-10-CM | POA: Insufficient documentation

## 2024-10-15 LAB — NM PET CT CARDIAC PERFUSION MULTI W/ABSOLUTE BLOODFLOW
MBFR: 2.83
Nuc Rest EF: 77 %
Nuc Stress EF: 81 %
Peak HR: 104 {beats}/min
Rest HR: 76 {beats}/min
Rest MBF: 1.07 ml/g/min
Rest Nuclear Isotope Dose: 21.8 mCi
SRS: 0
SSS: 1
ST Depression (mm): 0 mm
Stress MBF: 3.03 ml/g/min
Stress Nuclear Isotope Dose: 21.5 mCi
TID: 1

## 2024-10-15 MED ORDER — RUBIDIUM RB82 GENERATOR (RUBYFILL)
25.0000 | PACK | Freq: Once | INTRAVENOUS | Status: AC
  Administered 2024-10-15: 21.81 via INTRAVENOUS

## 2024-10-15 MED ORDER — REGADENOSON 0.4 MG/5ML IV SOLN
INTRAVENOUS | Status: AC
  Filled 2024-10-15: qty 5

## 2024-10-15 MED ORDER — RUBIDIUM RB82 GENERATOR (RUBYFILL)
25.0000 | PACK | Freq: Once | INTRAVENOUS | Status: AC
  Administered 2024-10-15: 21.54 via INTRAVENOUS

## 2024-10-15 MED ORDER — REGADENOSON 0.4 MG/5ML IV SOLN
0.4000 mg | Freq: Once | INTRAVENOUS | Status: AC
  Administered 2024-10-15: 0.4 mg via INTRAVENOUS
  Filled 2024-10-15: qty 5

## 2024-10-16 ENCOUNTER — Encounter: Payer: Self-pay | Admitting: Nurse Practitioner

## 2024-10-16 ENCOUNTER — Telehealth: Payer: Self-pay

## 2024-10-16 ENCOUNTER — Ambulatory Visit: Admitting: Nurse Practitioner

## 2024-10-16 ENCOUNTER — Ambulatory Visit: Payer: Self-pay

## 2024-10-16 VITALS — BP 158/93 | HR 106 | Temp 97.4°F | Ht 60.0 in | Wt 183.0 lb

## 2024-10-16 DIAGNOSIS — R030 Elevated blood-pressure reading, without diagnosis of hypertension: Secondary | ICD-10-CM

## 2024-10-16 NOTE — Progress Notes (Signed)
 "  BP (!) 158/93 (BP Location: Left Arm, Cuff Size: Large)   Pulse (!) 106   Temp (!) 97.4 F (36.3 C) (Oral)   Ht 5' (1.524 m)   Wt 183 lb (83 kg)   LMP  (LMP Unknown)   SpO2 98%   BMI 35.74 kg/m    Subjective:    Patient ID: Bethany Lara, female    DOB: Jun 27, 1951, 74 y.o.   MRN: [id number]  HPI: Bethany Lara is a 74 y.o. female  Chief Complaint  Patient presents with   office visit    Patient is following up. There is still some swelling in legs and feet   HYPERTENSION without Chronic Kidney Disease Patient states she is taking the Tizanidine .  Feeling better being back on the medication.  Hypertension status: uncontrolled  Satisfied with current treatment? yes Duration of hypertension: years BP monitoring frequency:  not checking BP range:  BP medication side effects:  no Medication compliance: excellent compliance Previous BP meds:PRN furosemide  Aspirin : no Recurrent headaches: no Visual changes: no Palpitations: no Dyspnea: no Chest pain: no Lower extremity edema: yes Dizzy/lightheaded: no  Relevant past medical, surgical, family and social history reviewed and updated as indicated. Interim medical history since our last visit reviewed. Allergies and medications reviewed and updated.  Review of Systems  Eyes:  Negative for visual disturbance.  Respiratory:  Negative for cough, chest tightness and shortness of breath.   Cardiovascular:  Positive for leg swelling. Negative for chest pain and palpitations.  Neurological:  Negative for dizziness and headaches.    Per HPI unless specifically indicated above     Objective:    BP (!) 158/93 (BP Location: Left Arm, Cuff Size: Large)   Pulse (!) 106   Temp (!) 97.4 F (36.3 C) (Oral)   Ht 5' (1.524 m)   Wt 183 lb (83 kg)   LMP  (LMP Unknown)   SpO2 98%   BMI 35.74 kg/m   Wt Readings from Last 3 Encounters:  10/16/24 183 lb (83 kg)  10/14/24 183 lb (83 kg)  10/14/24 189 lb (85.7 kg)     Physical Exam Vitals and nursing note reviewed.  Constitutional:      General: She is not in acute distress.    Appearance: Normal appearance. She is normal weight. She is not ill-appearing, toxic-appearing or diaphoretic.  HENT:     Head: Normocephalic.     Right Ear: External ear normal.     Left Ear: External ear normal.     Nose: Nose normal.     Mouth/Throat:     Mouth: Mucous membranes are moist.     Pharynx: Oropharynx is clear.  Eyes:     General:        Right eye: No discharge.        Left eye: No discharge.     Extraocular Movements: Extraocular movements intact.     Conjunctiva/sclera: Conjunctivae normal.     Pupils: Pupils are equal, round, and reactive to light.  Cardiovascular:     Rate and Rhythm: Normal rate and regular rhythm.     Heart sounds: No murmur heard. Pulmonary:     Effort: Pulmonary effort is normal. No respiratory distress.     Breath sounds: Normal breath sounds. No wheezing or rales.  Musculoskeletal:     Cervical back: Normal range of motion and neck supple.     Right lower leg: Edema present.     Left lower leg: Edema present.  Skin:    General: Skin is warm and dry.     Capillary Refill: Capillary refill takes less than 2 seconds.  Neurological:     General: No focal deficit present.     Mental Status: She is alert and oriented to person, place, and time. Mental status is at baseline.  Psychiatric:        Mood and Affect: Mood normal.        Behavior: Behavior normal.        Thought Content: Thought content normal.        Judgment: Judgment normal.     Results for orders placed or performed during the hospital encounter of 10/15/24  NM PET CT CARDIAC PERFUSION MULTI W/ABSOLUTE BLOODFLOW   Collection Time: 10/15/24  3:02 PM  Result Value Ref Range   Rest Nuclear Isotope Dose 21.8 mCi   Stress Nuclear Isotope Dose 21.5 mCi   Rest HR 76.0 bpm   Rest BP 124/81 mmHg   Peak HR 104 bpm   Peak BP 129/73 mmHg   SSS 1.0    SRS 0.0     TID 1.00    Nuc Stress EF 81 %   Nuc Rest EF 77 %   ST Depression (mm) 0 mm   Rest MBF 1.07 ml/g/min   Stress MBF 3.03 ml/g/min   MBFR 2.83       Assessment & Plan:   Problem List Items Addressed This Visit       Other   Elevated blood pressure reading - Primary   BP improved from two days ago.  Saw Cardiology yesterday with normal BP and normal stress test.  Will clear patient for surgery.         Follow up plan: No follow-ups on file.      "

## 2024-10-16 NOTE — Telephone Encounter (Signed)
 Called patient on behalf of the [facility] OR, letting her know that if she is able to make it to the hospital, we plan to proceed with surgery as scheduled. If she will be late or unable to make it for any reason, provided patient with [facility] OR telephone number [phone]. Relayed this information to her husband.

## 2024-10-16 NOTE — Assessment & Plan Note (Signed)
 BP improved from two days ago.  Saw Cardiology yesterday with normal BP and normal stress test.  Will clear patient for surgery.

## 2024-10-19 MED ORDER — ORAL CARE MOUTH RINSE: 15.0000 mL | Freq: Once | OROMUCOSAL | Status: AC

## 2024-10-19 MED ORDER — CHLORHEXIDINE GLUCONATE 0.12 % MT SOLN
15.0000 mL | Freq: Once | OROMUCOSAL | Status: AC
  Administered 2024-10-20: 15 mL via OROMUCOSAL

## 2024-10-19 MED ORDER — LACTATED RINGERS IV SOLN: INTRAVENOUS | Status: DC

## 2024-10-20 ENCOUNTER — Other Ambulatory Visit: Payer: Self-pay

## 2024-10-20 ENCOUNTER — Encounter: Admission: RE | Disposition: A | Payer: Self-pay | Source: Home / Self Care | Attending: Neurosurgery

## 2024-10-20 ENCOUNTER — Observation Stay
Admission: RE | Admit: 2024-10-20 | Discharge: 2024-10-21 | Disposition: A | Discharge status: Home / Self Care | Attending: Neurosurgery | Admitting: Neurosurgery

## 2024-10-20 ENCOUNTER — Ambulatory Visit: Payer: Self-pay | Admitting: Urgent Care

## 2024-10-20 ENCOUNTER — Encounter: Payer: Self-pay | Admitting: Neurosurgery

## 2024-10-20 ENCOUNTER — Ambulatory Visit

## 2024-10-20 DIAGNOSIS — M5417 Radiculopathy, lumbosacral region: Principal | ICD-10-CM | POA: Insufficient documentation

## 2024-10-20 DIAGNOSIS — M7138 Other bursal cyst, other site: Secondary | ICD-10-CM | POA: Diagnosis not present

## 2024-10-20 DIAGNOSIS — Z9889 Other specified postprocedural states: Principal | ICD-10-CM

## 2024-10-20 DIAGNOSIS — Z01818 Encounter for other preprocedural examination: Secondary | ICD-10-CM

## 2024-10-20 LAB — ABO/RH: ABO/RH(D): A POS

## 2024-10-20 MED ORDER — ONDANSETRON HCL 4 MG/2ML IJ SOLN: 4.0000 mg | Freq: Four times a day (QID) | INTRAMUSCULAR | Status: DC | PRN

## 2024-10-20 MED ORDER — SORBITOL 70 % SOLN: 30.0000 mL | Freq: Every day | Status: DC | PRN

## 2024-10-20 MED ORDER — ACETAMINOPHEN 500 MG PO TABS
ORAL_TABLET | ORAL | Status: AC
  Filled 2024-10-20: qty 2

## 2024-10-20 MED ORDER — HYDROMORPHONE HCL 1 MG/ML IJ SOLN
INTRAMUSCULAR | Status: AC
  Filled 2024-10-20: qty 1

## 2024-10-20 MED ORDER — FUROSEMIDE 20 MG PO TABS
40.0000 mg | ORAL_TABLET | Freq: Every day | ORAL | Status: DC
  Administered 2024-10-20 – 2024-10-21 (×2): 40 mg via ORAL
  Filled 2024-10-20 (×2): qty 2

## 2024-10-20 MED ORDER — GLYCOPYRROLATE 0.2 MG/ML IJ SOLN
INTRAMUSCULAR | Status: DC | PRN
  Administered 2024-10-20: .2 mg via INTRAVENOUS

## 2024-10-20 MED ORDER — MAGNESIUM CITRATE PO SOLN: 1.0000 | Freq: Once | ORAL | Status: DC | PRN

## 2024-10-20 MED ORDER — BUPIVACAINE-EPINEPHRINE (PF) 0.5% -1:200000 IJ SOLN
INTRAMUSCULAR | Status: DC | PRN
  Administered 2024-10-20: 10 mL

## 2024-10-20 MED ORDER — DEXMEDETOMIDINE HCL IN NACL 200 MCG/50ML IV SOLN
INTRAVENOUS | Status: DC | PRN
  Administered 2024-10-20: 20 ug via INTRAVENOUS

## 2024-10-20 MED ORDER — SODIUM CHLORIDE 0.9% FLUSH
3.0000 mL | Freq: Two times a day (BID) | INTRAVENOUS | Status: DC
  Administered 2024-10-20 – 2024-10-21 (×2): 3 mL via INTRAVENOUS

## 2024-10-20 MED ORDER — LIDOCAINE HCL (CARDIAC) PF 100 MG/5ML IV SOSY
PREFILLED_SYRINGE | INTRAVENOUS | Status: DC | PRN
  Administered 2024-10-20: 100 mg via INTRAVENOUS

## 2024-10-20 MED ORDER — DOCUSATE SODIUM 100 MG PO CAPS
100.0000 mg | ORAL_CAPSULE | Freq: Two times a day (BID) | ORAL | Status: DC
  Administered 2024-10-20 – 2024-10-21 (×2): 100 mg via ORAL
  Filled 2024-10-20 (×2): qty 1

## 2024-10-20 MED ORDER — OXYCODONE HCL 5 MG PO TABS
5.0000 mg | ORAL_TABLET | ORAL | Status: DC | PRN
  Administered 2024-10-21: 5 mg via ORAL
  Filled 2024-10-20: qty 1

## 2024-10-20 MED ORDER — SUCCINYLCHOLINE CHLORIDE 200 MG/10ML IV SOSY
PREFILLED_SYRINGE | INTRAVENOUS | Status: DC | PRN
  Administered 2024-10-20: 100 mg via INTRAVENOUS

## 2024-10-20 MED ORDER — 0.9 % SODIUM CHLORIDE (POUR BTL) OPTIME
TOPICAL | Status: DC | PRN
  Administered 2024-10-20: 500 mL

## 2024-10-20 MED ORDER — ACETAMINOPHEN 10 MG/ML IV SOLN
INTRAVENOUS | Status: DC | PRN
  Administered 2024-10-20: 1000 mg via INTRAVENOUS

## 2024-10-20 MED ORDER — ROCURONIUM BROMIDE 100 MG/10ML IV SOLN
INTRAVENOUS | Status: DC | PRN
  Administered 2024-10-20: 70 mg via INTRAVENOUS

## 2024-10-20 MED ORDER — CHLORHEXIDINE GLUCONATE 0.12 % MT SOLN
OROMUCOSAL | Status: AC
  Filled 2024-10-20: qty 15

## 2024-10-20 MED ORDER — ACETAMINOPHEN 500 MG PO TABS
1000.0000 mg | ORAL_TABLET | Freq: Four times a day (QID) | ORAL | Status: DC
  Administered 2024-10-20 – 2024-10-21 (×3): 1000 mg via ORAL
  Filled 2024-10-20 (×3): qty 2

## 2024-10-20 MED ORDER — PHENYLEPHRINE HCL-NACL 20-0.9 MG/250ML-% IV SOLN
INTRAVENOUS | Status: DC | PRN
  Administered 2024-10-20: 25 ug/min via INTRAVENOUS

## 2024-10-20 MED ORDER — TIZANIDINE HCL 4 MG PO TABS
4.0000 mg | ORAL_TABLET | Freq: Four times a day (QID) | ORAL | Status: DC | PRN
  Administered 2024-10-21: 4 mg via ORAL
  Filled 2024-10-20: qty 1

## 2024-10-20 MED ORDER — FENTANYL CITRATE (PF) 100 MCG/2ML IJ SOLN
INTRAMUSCULAR | Status: DC | PRN
  Administered 2024-10-20 (×2): 50 ug via INTRAVENOUS

## 2024-10-20 MED ORDER — METHYLPREDNISOLONE ACETATE 40 MG/ML IJ SUSP
INTRAMUSCULAR | Status: DC | PRN
  Administered 2024-10-20: 40 mg

## 2024-10-20 MED ORDER — CEFAZOLIN SODIUM-DEXTROSE 2-4 GM/100ML-% IV SOLN
2.0000 g | Freq: Once | INTRAVENOUS | Status: AC
  Administered 2024-10-20: 2 g via INTRAVENOUS

## 2024-10-20 MED ORDER — DEXAMETHASONE SOD PHOSPHATE PF 10 MG/ML IJ SOLN
INTRAMUSCULAR | Status: DC | PRN
  Administered 2024-10-20: 10 mg via INTRAVENOUS

## 2024-10-20 MED ORDER — PHENOL 1.4 % MT LIQD: 1.0000 | OROMUCOSAL | Status: DC | PRN

## 2024-10-20 MED ORDER — OXYCODONE HCL 5 MG/5ML PO SOLN: 5.0000 mg | Freq: Once | ORAL | Status: AC | PRN

## 2024-10-20 MED ORDER — MENTHOL 3 MG MT LOZG: 1.0000 | LOZENGE | OROMUCOSAL | Status: DC | PRN

## 2024-10-20 MED ORDER — SODIUM CHLORIDE 0.9 % IV SOLN: 250.0000 mL | INTRAVENOUS | Status: DC

## 2024-10-20 MED ORDER — CEFAZOLIN SODIUM-DEXTROSE 2-4 GM/100ML-% IV SOLN
INTRAVENOUS | Status: AC
  Filled 2024-10-20: qty 100

## 2024-10-20 MED ORDER — FENTANYL CITRATE (PF) 100 MCG/2ML IJ SOLN
INTRAMUSCULAR | Status: AC
  Filled 2024-10-20: qty 2

## 2024-10-20 MED ORDER — MAGNESIUM OXIDE -MG SUPPLEMENT 400 (240 MG) MG PO TABS
200.0000 mg | ORAL_TABLET | Freq: Every day | ORAL | Status: DC
  Administered 2024-10-21: 200 mg via ORAL
  Filled 2024-10-20: qty 1

## 2024-10-20 MED ORDER — CHLORHEXIDINE GLUCONATE 4 % EX SOLN
1.0000 | CUTANEOUS | 1 refills | Status: AC
  Filled 2024-10-20: qty 236, 30d supply, fill #0

## 2024-10-20 MED ORDER — BUPIVACAINE-EPINEPHRINE (PF) 0.5% -1:200000 IJ SOLN
INTRAMUSCULAR | Status: AC
  Filled 2024-10-20: qty 10

## 2024-10-20 MED ORDER — POLYETHYLENE GLYCOL 3350 17 G PO PACK: 17.0000 g | PACK | Freq: Every day | ORAL | Status: DC | PRN

## 2024-10-20 MED ORDER — OXYCODONE HCL 5 MG PO TABS
5.0000 mg | ORAL_TABLET | Freq: Once | ORAL | Status: AC | PRN
  Administered 2024-10-20: 5 mg via ORAL

## 2024-10-20 MED ORDER — OXYCODONE HCL 5 MG PO TABS
ORAL_TABLET | ORAL | Status: AC
  Filled 2024-10-20: qty 1

## 2024-10-20 MED ORDER — DEXAMETHASONE 4 MG PO TABS
4.0000 mg | ORAL_TABLET | Freq: Four times a day (QID) | ORAL | Status: AC
  Administered 2024-10-20 – 2024-10-21 (×4): 4 mg via ORAL
  Filled 2024-10-20 (×5): qty 1

## 2024-10-20 MED ORDER — SENNA 8.6 MG PO TABS
1.0000 | ORAL_TABLET | Freq: Two times a day (BID) | ORAL | Status: DC
  Administered 2024-10-20 – 2024-10-21 (×2): 8.6 mg via ORAL
  Filled 2024-10-20 (×2): qty 1

## 2024-10-20 MED ORDER — ACETAMINOPHEN 10 MG/ML IV SOLN
INTRAVENOUS | Status: AC
  Filled 2024-10-20: qty 100

## 2024-10-20 MED ORDER — PROPOFOL 10 MG/ML IV BOLUS
INTRAVENOUS | Status: DC | PRN
  Administered 2024-10-20: 200 mg via INTRAVENOUS
  Administered 2024-10-20: 100 mg via INTRAVENOUS

## 2024-10-20 MED ORDER — SURGIFLO WITH THROMBIN (HEMOSTATIC MATRIX KIT) OPTIME
TOPICAL | Status: DC | PRN
  Administered 2024-10-20: 1 via TOPICAL

## 2024-10-20 MED ORDER — LORATADINE 10 MG PO TABS
10.0000 mg | ORAL_TABLET | Freq: Every day | ORAL | Status: DC
  Administered 2024-10-21: 10 mg via ORAL
  Filled 2024-10-20: qty 1

## 2024-10-20 MED ORDER — PHENYLEPHRINE HCL-NACL 20-0.9 MG/250ML-% IV SOLN
INTRAVENOUS | Status: AC
  Filled 2024-10-20: qty 250

## 2024-10-20 MED ORDER — SUGAMMADEX SODIUM 200 MG/2ML IV SOLN
INTRAVENOUS | Status: DC | PRN
  Administered 2024-10-20: 300 mg via INTRAVENOUS

## 2024-10-20 MED ORDER — ONDANSETRON HCL 4 MG/2ML IJ SOLN
INTRAMUSCULAR | Status: DC | PRN
  Administered 2024-10-20 (×2): 4 mg via INTRAVENOUS

## 2024-10-20 MED ORDER — FENTANYL CITRATE (PF) 100 MCG/2ML IJ SOLN
25.0000 ug | INTRAMUSCULAR | Status: DC | PRN
  Administered 2024-10-20 (×2): 50 ug via INTRAVENOUS

## 2024-10-20 MED ORDER — MUPIROCIN 2 % EX OINT
1.0000 | TOPICAL_OINTMENT | Freq: Two times a day (BID) | CUTANEOUS | 0 refills | Status: AC
  Filled 2024-10-20: qty 22, 30d supply, fill #0

## 2024-10-20 MED ORDER — ENOXAPARIN SODIUM 40 MG/0.4ML IJ SOSY
40.0000 mg | PREFILLED_SYRINGE | INTRAMUSCULAR | Status: DC
  Administered 2024-10-21: 40 mg via SUBCUTANEOUS
  Filled 2024-10-20: qty 0.4

## 2024-10-20 MED ORDER — OXYCODONE HCL 5 MG PO TABS: 10.0000 mg | ORAL_TABLET | ORAL | Status: DC | PRN

## 2024-10-20 MED ORDER — HYDROMORPHONE HCL 1 MG/ML IJ SOLN
INTRAMUSCULAR | Status: DC | PRN
  Administered 2024-10-20: 1 mg via INTRAVENOUS

## 2024-10-20 MED ORDER — HYDROMORPHONE HCL 1 MG/ML IJ SOLN: 0.5000 mg | INTRAMUSCULAR | Status: AC | PRN

## 2024-10-20 MED ORDER — ONDANSETRON HCL 4 MG PO TABS: 4.0000 mg | ORAL_TABLET | Freq: Four times a day (QID) | ORAL | Status: DC | PRN

## 2024-10-20 MED ORDER — SODIUM CHLORIDE 0.9% FLUSH: 3.0000 mL | INTRAVENOUS | Status: DC | PRN

## 2024-10-20 NOTE — Transfer of Care (Signed)
 Immediate Anesthesia Transfer of Care Note  Patient: Bethany Lara  Procedure(s) Performed: Right L5-S1 Hemilaminectomy and medial facetectomy for Synovial Cyst Resection (Right: Spine Lumbar)  Patient Location: PACU  Anesthesia Type:General  Level of Consciousness: drowsy and patient cooperative  Airway & Oxygen Therapy: Patient Spontanous Breathing and Patient connected to face mask oxygen  Post-op Assessment: Report given to RN and Post -op Vital signs reviewed and stable  Post vital signs: Reviewed and stable  Last Vitals:  Vitals Value Taken Time  BP 158/89 10/20/24 12:41  Temp    Pulse 65 10/20/24 12:44  Resp    SpO2 100 % 10/20/24 12:44  Vitals shown include unfiled device data.  Last Pain:  Vitals:   10/20/24 0926  PainSc: 6          Complications: No notable events documented.

## 2024-10-20 NOTE — Anesthesia Preprocedure Evaluation (Signed)
"                                    Anesthesia Evaluation  Patient identified by MRN, date of birth, ID band Patient awake    Reviewed: Allergy & Precautions, NPO status , Patient's Chart, lab work & pertinent test results  History of Anesthesia Complications Negative for: history of anesthetic complications  Airway Mallampati: III  TM Distance: >3 FB Neck ROM: full    Dental  (+) Caps   Pulmonary sleep apnea    Pulmonary exam normal        Cardiovascular negative cardio ROS Normal cardiovascular exam     Neuro/Psych  PSYCHIATRIC DISORDERS  Depression     Neuromuscular disease CVA    GI/Hepatic negative GI ROS, Neg liver ROS,,,  Endo/Other  negative endocrine ROS    Renal/GU      Musculoskeletal   Abdominal   Peds  Hematology negative hematology ROS (+)   Anesthesia Other Findings Past Medical History: No date: Allergy No date: Arrhythmia No date: Carpal tunnel syndrome No date: Depression     Comment:  situational doe to chronic pain No date: Hyperlipidemia No date: Osteoarthritis of left hip No date: Sleep apnea     Comment:  no cpap No date: Stroke [facility]) 10/26/2017: Swallowing dysfunction  Past Surgical History: No date: BUNIONECTOMY 11/14/2015: CARPAL TUNNEL RELEASE 06/17/2019: CARPAL TUNNEL RELEASE; Right No date: CHOLECYSTECTOMY No date: EYELID LACERATION REPAIR     Comment:  Eye Lid Surgery 10/30/2017: PEG PLACEMENT; N/A     Comment:  Procedure: PERCUTANEOUS ENDOSCOPIC GASTROSTOMY (PEG)               PLACEMENT;  Surgeon: Unk Corinn Skiff, MD;  Location:               [facility] ENDOSCOPY;  Service: Gastroenterology;  Laterality:               N/A;  BMI    Body Mass Index: 35.74 kg/m      Reproductive/Obstetrics negative OB ROS                              Anesthesia Physical Anesthesia Plan  ASA: 2  Anesthesia Plan: General ETT   Post-op Pain Management: Toradol IV (intra-op)* and Ofirmev  IV  (intra-op)*   Induction: Intravenous  PONV Risk Score and Plan: 3 and Ondansetron , Dexamethasone , Midazolam and Treatment may vary due to age or medical condition  Airway Management Planned: Oral ETT  Additional Equipment:   Intra-op Plan:   Post-operative Plan: Extubation in OR  Informed Consent: I have reviewed the patients History and Physical, chart, labs and discussed the procedure including the risks, benefits and alternatives for the proposed anesthesia with the patient or authorized representative who has indicated his/her understanding and acceptance.     Dental Advisory Given  Plan Discussed with: Anesthesiologist, CRNA and Surgeon  Anesthesia Plan Comments: (Patient consented for risks of anesthesia including but not limited to:  - adverse reactions to medications - damage to eyes, teeth, lips or other oral mucosa - nerve damage due to positioning  - sore throat or hoarseness - Damage to heart, brain, nerves, lungs, other parts of body or loss of life  Patient voiced understanding and assent.)         Anesthesia Quick Evaluation  "

## 2024-10-20 NOTE — Op Note (Signed)
 Indications: Right sided L5-S1 synovial cyst with severe radiculopathy  Findings: Massive joint cyst causing severe compression and displacement of the neural structures.  Exiting L5 nerve with swollen edematous dorsal root ganglion and nerve root.  Well decompressed at the end of the procedure  Preoperative Diagnosis: Lumbosacral Synovial Cyst Postoperative Diagnosis: Lumbosacral Synovial Cyst   EBL: 45 ml IVF: see anesthesia record Drains: none Disposition: Extubated and Stable to PACU Complications: none  No foley catheter was placed.  Procedure: Right L5/S1 laminectomy, medial facetectomy and synovial cyst resection   Preoperative Note:   Risks of surgery discussed include: infection, bleeding, stroke, coma, death, paralysis, CSF leak, nerve/spinal cord injury, numbness, tingling, weakness, complex regional pain syndrome, recurrent stenosis and/or disc herniation, vascular injury, development of instability, neck/back pain, need for further surgery, persistent symptoms, development of deformity, and the risks of anesthesia. They understood these risks and have agreed to proceed.  Operative Note:    The patient was then brought from the preoperative center with intravenous access established.  The patient underwent general anesthesia and endotracheal tube intubation, then was rotated on the Wilson frame.  An incision was marked with flouroscopy. The skin was then thoroughly cleansed.  Perioperative antibiotic prophylaxis was administered.  Sterile prep and drapes were then applied and a timeout was then observed.    Once this was complete a 5 cm incision was opened with the use of a #10 blade knife.  The paraspinus muscled on the right were subperiosteally dissected until the facet was visualized. Flouroscopy was used to confirm the level. A self-retaining retractor was placed.  The microscope was then sterilely brought into the field. Using a high speed drill, the right L 5/1  laminectomy and medial facet were drilled until ligamentum flavum was visualized. After the laminoforaminotomy was performed, a curette was used to dissect the ligamentum flavum and to elevated. The ligamentum was then carefully removed with 2mm Kerrison punch and rongeurs.  Once the ligamentum was removed we immediately encountered a large complex cystic structure.  We were able to continue to decompress more medially until we saw what appeared to be native dura.  We were able to identify a clear plane and followed this distally.  As we moved caudally we are able to identify the S1 nerve root as well as the lateral aspect of the thecal sac.  A large space-occupying complex cystic structure was encountered.  This was opened and mixed cystic and hemorrhagic contents were noted as well as some calcific contents.  The capsule as well as some components were sent for final pathology.  We continued to dissect this cranially.  We were unable to clearly identify the L5 nerve root so moved more cranial until we found the lateral aspect again of the dura.  The cyst was so large and space-occupying that we needed to attack this from multiple angles.  We performed a more aggressive foraminotomies to see if we are able to identify the nerve root as it exited.  Her most clear plane ended up coming from the bottom as we slowly dissected the lateral aspect of the thecal sac and were able to encounter a structure extruding laterally.  This was severely displaced, we are able to identify a plane and dissected slowly the cyst off of the nerve root.  We continue to follow this out laterally until it exited the foramen.  As we dissected this we are able to continue to resect the cystic structure.  Once we performed a full  intracanalicular and foraminal decompression we followed the nerve out laterally and felt no ongoing compression.  The exiting L5 nerve root was swollen, edematous, and erythematous.  The dorsal root ganglion was also  swollen.    Once the thecal sac and nerve root were noted to be relaxed and under less tension the ball-tipped feeler and [facility] were passed along the foramen distally to to ensure no residual compression was noted.  With none noted attention was then turned to closure.  A Depo-Medrol  was placed on the exiting and traversing nerve roots  Hemostasis was confirmed after removal of the gelfoam. The retractor was then removed and hemostasis achieved in the superficial tissues. The wound was copiously irrigated.  The fascial layer was reapproximated with the use of a 0- Vicryl suture.  Subcutaneous tissue layer was reapproximated using 2-0 Vicryl suture.  The skin was then cleansed and Dermabond was used to close the skin opening.  Patient was then rotated back to the preoperative bed awakened from anesthesia and taken to recovery all counts are correct in this case.  I performed the entire procedure with the assistance of Edsel Goods PA as an designer, television/film set, exposure, retraction, isolation of neural tissue, protection of neural tissue, and closure.  I performed the critical aspects of the procedure myself.   Penne MICAEL Sharps, MD

## 2024-10-20 NOTE — Interval H&P Note (Signed)
 History and Physical Interval Note:  10/20/2024 9:31 AM  Bethany Lara  has presented today for surgery, with the diagnosis of Lumbosacral Synovial Cyst.  The various methods of treatment have been discussed with the patient and family. After consideration of risks, benefits and other options for treatment, the patient has consented to  Procedures: Right L5-S1 Hemilaminectomy and medial facetectomy for Synovial Cyst Resection (Right) as a surgical intervention.  The patient's history has been reviewed, patient examined, no change in status, stable for surgery.  I have reviewed the patient's chart and labs.  Questions were answered to the patient's satisfaction.    Heart and lungs clear    Penne LELON Sharps

## 2024-10-20 NOTE — Anesthesia Procedure Notes (Signed)
 Procedure Name: Intubation Date/Time: 10/20/2024 9:50 AM  Performed by: Ledora Duncan, CRNAPre-anesthesia Checklist: Patient identified, Emergency Drugs available, Suction available and Patient being monitored Patient Re-evaluated:Patient Re-evaluated prior to induction Oxygen Delivery Method: Circle system utilized Preoxygenation: Pre-oxygenation with 100% oxygen Induction Type: IV induction Ventilation: Mask ventilation without difficulty Laryngoscope Size: McGrath and 3 Grade View: Grade I Tube type: Oral Tube size: 6.5 mm Number of attempts: 1 Airway Equipment and Method: Stylet Placement Confirmation: ETT inserted through vocal cords under direct vision, positive ETCO2 and breath sounds checked- equal and bilateral Secured at: 21 cm Tube secured with: Tape Dental Injury: Teeth and Oropharynx as per pre-operative assessment

## 2024-10-20 NOTE — Discharge Instructions (Signed)

## 2024-10-21 ENCOUNTER — Ambulatory Visit: Admitting: Neurosurgery

## 2024-10-21 ENCOUNTER — Other Ambulatory Visit: Payer: Self-pay

## 2024-10-21 ENCOUNTER — Encounter: Payer: Self-pay | Admitting: Neurosurgery

## 2024-10-21 MED ORDER — TIZANIDINE HCL 4 MG PO TABS
4.0000 mg | ORAL_TABLET | Freq: Four times a day (QID) | ORAL | 0 refills | Status: AC | PRN
  Filled 2024-10-21: qty 120, 30d supply, fill #0

## 2024-10-21 MED ORDER — METHYLPREDNISOLONE 4 MG PO TBPK
ORAL_TABLET | ORAL | 0 refills | Status: AC
  Filled 2024-10-21: qty 21, 6d supply, fill #0

## 2024-10-21 MED ORDER — SENNA 8.6 MG PO TABS
1.0000 | ORAL_TABLET | Freq: Two times a day (BID) | ORAL | 0 refills | Status: AC | PRN
  Filled 2024-10-21: qty 30, 15d supply, fill #0

## 2024-10-21 MED ORDER — POLYETHYLENE GLYCOL 3350 17 GM/SCOOP PO POWD
17.0000 g | Freq: Every day | ORAL | 0 refills | Status: AC | PRN
  Filled 2024-10-21: qty 238, 14d supply, fill #0

## 2024-10-21 NOTE — TOC Transition Note (Signed)
 Transition of Care [facility]) - Discharge Note   Patient Details  Name: Bethany Lara MRN: [id number] Date of Birth: 04-Oct-1950  Transition of Care [facility]) CM/SW Contact:  Sherl Yzaguirre L Alonie Gazzola, LCSW Phone Number: 10/21/2024, 1:10 PM   Clinical Narrative:      BSC delivered to patient room. CMA and CSW met with patient to review offers for Home Health. Patient agreeable to [facility].   Patient discharging home today.        Patient Goals and CMS Choice            Discharge Placement                       Discharge Plan and Services Additional resources added to the After Visit Summary for                                       Social Drivers of Health (SDOH) Interventions SDOH Screenings   Food Insecurity: No Food Insecurity (10/20/2024)  Housing: Low Risk (10/20/2024)  Transportation Needs: No Transportation Needs (10/20/2024)  Utilities: Not At Risk (10/20/2024)  Depression (PHQ2-9): Low Risk (10/14/2024)  Recent Concern: Depression (PHQ2-9) - High Risk (09/01/2024)  Financial Resource Strain: Low Risk  (03/23/2024)   Received from [facility]  Social Connections: Socially Integrated (10/20/2024)  Tobacco Use: Low Risk (10/20/2024)  Health Literacy: Adequate Health Literacy (02/13/2024)     Readmission Risk Interventions     No data to display

## 2024-10-21 NOTE — Evaluation (Signed)
 Occupational Therapy Evaluation Patient Details Name: Bethany Lara MRN: [id number] DOB: Mar 28, 1951 Today's Date: 10/21/2024   History of Present Illness   Pt is a 74 y.o. female s/p right L5/S1 laminectomy, medial facetectomy and synovial cyst resection on 1/27. PMH significant for HLD, CVA & OA.     Clinical Impressions Pt seen for OT evaluation this date, POD#1 from lumbar fusion. Prior to hospital admission, pt was mod independent with mobility, ADL, and IADL. Spouse assisting with LB ADLs intermittently secondary to LBP. No falls in past 12 months. However, has been having increased difficulty with all aspects of mobility and ADL due to back pain. Pt lives with spouse in a single family home with 1 step to enter and bilateral handrails (ramp for back entry) with spouse able to provide 24/7 assist/support as needed for pt.   Currently pt is at supervision level with all aspects of mobility using RW and up to min assist for LB ADL tasks. Pt educated in back precautions with handout provided, self care skills, bed mobility and functional transfer training, AE/DME for bathing, dressing, and toileting needs, and home/routines modifications and falls prevention strategies to maximize safety and functional independence while minimizing falls risk and maintaining precautions. Pt verbalized understanding of all education/training provided. Handout provided to support recall and carry over of learned precautions/techniques for bed mobility, functional transfers, and self care skills. No additional skilled OT needs at this time. Will discharge in house. Upon hospital discharge, pt safe to discharge home.      If plan is discharge home, recommend the following:   A little help with walking and/or transfers;A little help with bathing/dressing/bathroom;Help with stairs or ramp for entrance;Assist for transportation     Functional Status Assessment   Patient has had a recent decline in their  functional status and demonstrates the ability to make significant improvements in function in a reasonable and predictable amount of time.     Equipment Recommendations   BSC/3in1;Tub/shower bench;Other (comment) (sock aide, long handled sponge)      Precautions/Restrictions   Precautions Precautions: Back Precaution Booklet Issued: Yes (comment) Recall of Precautions/Restrictions: Intact Precaution/Restrictions Comments: recalls spinal precautions start of session, good adherence throughout Restrictions Other Position/Activity Restrictions: no brace needed per orders     Mobility Bed Mobility Overal bed mobility: Needs Assistance Bed Mobility: Rolling, Sidelying to Sit, Sit to Sidelying Rolling: Min assist Sidelying to sit: Min assist     Sit to sidelying: Supervision General bed mobility comments: edu on log roll technique, MIN A to exit bed first attempt with cues for sequencing, supervision to return to bed. discussed exit bed on both sides with sequencing. Patient Response: Cooperative  Transfers Overall transfer level: Needs assistance Equipment used: Rolling walker (2 wheels) Transfers: Sit to/from Stand Sit to Stand: Contact guard assist, Supervision           General transfer comment: multiple STS CGA fading to supervision with cues for hand placement, good carryover      Balance Overall balance assessment: Needs assistance Sitting-balance support: No upper extremity supported, Feet supported Sitting balance-Leahy Scale: Good     Standing balance support: Reliant on assistive device for balance, During functional activity Standing balance-Leahy Scale: Fair                             ADL either performed or assessed with clinical judgement   ADL Overall ADL's : Needs assistance/impaired  Lower Body Dressing: Sit to/from stand;Cueing for sequencing;With adaptive equipment;Supervision/safety Lower Body  Dressing Details (indicate cue type and reason): doff / don pants with reacher, spouse will assist with socks / shoes at discharge. discussed use of supportive footwear around home for ambulation. cues for unilateral hand placement and balance prior to pulling pants over hips. Toilet Transfer: Supervision/safety;BSC/3in1;Comfort height toilet;Ambulation;Rolling walker (2 wheels) Toilet Transfer Details (indicate cue type and reason): t/f bathroom using RW, BSC over standard commode, cues for hand placement and sequencing. good adherence with spinal precautions     Tub/ Shower Transfer: Tub transfer;Tub bench Tub/Shower Transfer Details (indicate cue type and reason): demonstrated TTB transfer with video. educated pt on safe shower performance with use of HH shower head and seated showers to maintain spinal precautions. both verbalized understanding. Functional mobility during ADLs: Supervision/safety;Rolling walker (2 wheels) General ADL Comments: good recall of spinal precautions, CGA-supervision throughout     Vision Baseline Vision/History: 1 Wears glasses Ability to See in Adequate Light: 0 Adequate Patient Visual Report: No change from baseline              Pertinent Vitals/Pain Pain Assessment Pain Assessment: No/denies pain     Extremity/Trunk Assessment Upper Extremity Assessment Upper Extremity Assessment: Right hand dominant;Overall [facility] for tasks assessed   Lower Extremity Assessment Lower Extremity Assessment: Defer to PT evaluation   Cervical / Trunk Assessment Cervical / Trunk Assessment: Back Surgery   Communication Communication Communication: No apparent difficulties   Cognition Arousal: Alert Behavior During Therapy: WFL for tasks assessed/performed Cognition: No apparent impairments                               Following commands: Intact       Cueing  General Comments   Cueing Techniques: Verbal cues  VSS on RA, spinal handout  provided   Exercises Other Exercises Other Exercises: edu on spinal precautions s/p back sx incuding handout with visual and verbal demo   Shoulder Instructions      Home Living Family/patient expects to be discharged to:: Private residence Living Arrangements: Spouse/significant other Available Help at Discharge: Available 24 hours/day Type of Home: House Home Access: Ramped entrance;Stairs to enter Entrance Stairs-Number of Steps: 1 STE with bilateral rails, can use ramp in back   Home Layout: One level     Bathroom Shower/Tub: Tub/shower unit;Curtain   Bathroom Toilet: Standard Bathroom Accessibility: Yes   Home Equipment: [employer]ant (2 wheels);Cane - single point;Toilet riser;Grab bars - tub/shower;Hand held shower head;Transport chair;Adaptive equipment Adaptive Equipment: Reacher        Prior Functioning/Environment Prior Level of Function : Needs assist       Physical Assist : ADLs (physical)   ADLs (physical): Dressing;Bathing Mobility Comments: uses RW ADLs Comments: spouse assists with LB ADLs 2/2 back pain    OT Problem List: Decreased strength;Impaired balance (sitting and/or standing);Decreased knowledge of precautions;Decreased range of motion;Decreased activity tolerance;Decreased knowledge of use of DME or AE   OT Treatment/Interventions: Self-care/ADL training;DME and/or AE instruction;Therapeutic activities;Balance training;Splinting;Patient/family education      OT Goals(Current goals can be found in the care plan section)   Acute Rehab OT Goals OT Goal Formulation: With patient Time For Goal Achievement: 11/04/24 Potential to Achieve Goals: Good   OT Frequency:  Min 2X/week       AM-PAC OT 6 Clicks Daily Activity     Outcome Measure Help from another person eating meals?: None Help  from another person taking care of personal grooming?: None Help from another person toileting, which includes using toliet, bedpan, or urinal?: A  Little Help from another person bathing (including washing, rinsing, drying)?: A Little Help from another person to put on and taking off regular upper body clothing?: None Help from another person to put on and taking off regular lower body clothing?: A Little 6 Click Score: 21   End of Session Equipment Utilized During Treatment: Gait belt;Rolling walker (2 wheels) Nurse Communication: Mobility status  Activity Tolerance: Patient tolerated treatment well;No increased pain Patient left: in bed;with call bell/phone within reach;with family/visitor present  OT Visit Diagnosis: Muscle weakness (generalized) (M62.81);Other abnormalities of gait and mobility (R26.89)                Time: 9091-9057 OT Time Calculation (min): 34 min Charges:  OT General Charges $OT Visit: 1 Visit OT Evaluation $OT Eval Low Complexity: 1 Low OT Treatments $Self Care/Home Management : 23-37 mins Timithy Arons L. Audreana Hancox, OTR/L  10/21/24, 10:02 AM

## 2024-10-21 NOTE — Hospital Course (Signed)
 Improved leg pain overnight. Expected postop back pain.

## 2024-10-21 NOTE — Progress Notes (Signed)
 Patient is not able to walk the distance required to go the bathroom, or he/she is unable to safely negotiate stairs required to access the bathroom.  A 3in1 BSC will alleviate this problem

## 2024-10-21 NOTE — Evaluation (Signed)
 Physical Therapy Evaluation Patient Details Name: Bethany Lara MRN: [id number] DOB: August 08, 1951 Today's Date: 10/21/2024  History of Present Illness  Pt is a 74 y.o. female s/p right L5/S1 laminectomy, medial facetectomy and synovial cyst resection on 1/27. PMH significant for HLD, CVA & OA.  Clinical Impression  Pt did well with PT, reports considerable improvement in LBP and R hip/LE pain/symptoms post surgery.  She still had some R LE weakness but was functional and safe with mobility and prolonged ambulation effort.  Pt will benefit from continued PT to address functional limitations per surgical protocols.       If plan is discharge home, recommend the following: Two people to help with walking and/or transfers;Two people to help with bathing/dressing/bathroom   Can travel by private vehicle        Equipment Recommendations BSC/3in1  Recommendations for Other Services       Functional Status Assessment Patient has had a recent decline in their functional status and demonstrates the ability to make significant improvements in function in a reasonable and predictable amount of time.     Precautions / Restrictions Precautions Precautions: Back Precaution Booklet Issued: Yes (comment) Recall of Precautions/Restrictions: Intact Precaution/Restrictions Comments: recalls spinal precautions start of session, good adherence throughout Restrictions Other Position/Activity Restrictions: no brace needed per orders      Mobility  Bed Mobility Overal bed mobility: Needs Assistance Bed Mobility: Rolling, Sidelying to Sit Rolling: Supervision Sidelying to sit: Supervision       General bed mobility comments: Using rail pt was able to log roll and get up to sitting - cuing for sequencing and set up, slow with heavy use of rail    Transfers Overall transfer level: Needs assistance Equipment used: Rolling walker (2 wheels) Transfers: Sit to/from Stand Sit to Stand: Contact  guard assist, Supervision           General transfer comment: able to rise from multiple surfaces t/o session, cuing for UE use and positioning    Ambulation/Gait Ambulation/Gait assistance: Contact guard assist Gait Distance (Feet): 200 Feet Assistive device: Rolling walker (2 wheels)         General Gait Details: Pt did well with ambulation effort, VSS t/o with minimal fatigue.  She reports minimal R hip pain with stance phase but had very little limp or hesitancy and was able to maintain consistent cadence and speed.  Stairs Stairs: Yes Stairs assistance: Supervision Stair Management: Two rails Number of Stairs: 4 General stair comments: Reinforced correct sequencing and UE use, pt able to negotiate steps w/o phyiscal assist  Wheelchair Mobility     Tilt Bed    Modified Rankin (Stroke Patients Only)       Balance Overall balance assessment: Needs assistance Sitting-balance support: No upper extremity supported, Feet supported Sitting balance-Leahy Scale: Good     Standing balance support: Reliant on assistive device for balance, During functional activity Standing balance-Leahy Scale: Good                               Pertinent Vitals/Pain Pain Assessment Pain Assessment: 0-10 Pain Score: 1  Pain Location: R hip/    Home Living Family/patient expects to be discharged to:: Private residence Living Arrangements: Spouse/significant other Available Help at Discharge: Available 24 hours/day Type of Home: House Home Access: Ramped entrance;Stairs to enter   Entrance Stairs-Number of Steps: 1 STE with bilateral rails, can use ramp in back   Home  Layout: One level Home Equipment: Agricultural Consultant (2 wheels);Cane - single point;Toilet riser;Grab bars - tub/shower;Hand held shower head;Transport chair;Adaptive equipment      Prior Function Prior Level of Function : Needs assist       Physical Assist : ADLs (physical)   ADLs (physical):  Dressing;Bathing Mobility Comments: uses RW ADLs Comments: spouse assists with LB ADLs 2/2 back pain     Extremity/Trunk Assessment   Upper Extremity Assessment Upper Extremity Assessment: Overall WFL for tasks assessed    Lower Extremity Assessment Lower Extremity Assessment: Overall WFL for tasks assessed (R hip and ankle DF grossly 4-/5, L WNL)    Cervical / Trunk Assessment Cervical / Trunk Assessment: Back Surgery  Communication   Communication Communication: No apparent difficulties    Cognition Arousal: Alert Behavior During Therapy: WFL for tasks assessed/performed                             Following commands: Intact       Cueing Cueing Techniques: Verbal cues     General Comments General comments (skin integrity, edema, etc.): VSS on RA, spinal handout provided    Exercises Other Exercises Other Exercises: edu on spinal precautions s/p back sx incuding handout with visual and verbal demo   Assessment/Plan    PT Assessment Patient needs continued PT services  PT Problem List Decreased strength;Decreased activity tolerance;Decreased balance;Decreased mobility;Decreased knowledge of use of DME;Decreased safety awareness;Decreased knowledge of precautions;Pain       PT Treatment Interventions DME instruction;Gait training;Stair training;Functional mobility training;Therapeutic activities;Therapeutic exercise;Neuromuscular re-education;Balance training;Patient/family education    PT Goals (Current goals can be found in the Care Plan section)  Acute Rehab PT Goals Patient Stated Goal: go home PT Goal Formulation: With patient Time For Goal Achievement: 10/27/24 Potential to Achieve Goals: Good    Frequency 7X/week     Co-evaluation               AM-PAC PT 6 Clicks Mobility  Outcome Measure Help needed turning from your back to your side while in a flat bed without using bedrails?: None Help needed moving from lying on your back  to sitting on the side of a flat bed without using bedrails?: None Help needed moving to and from a bed to a chair (including a wheelchair)?: None Help needed standing up from a chair using your arms (e.g., wheelchair or bedside chair)?: None Help needed to walk in hospital room?: None Help needed climbing 3-5 steps with a railing? : A Little 6 Click Score: 23    End of Session Equipment Utilized During Treatment: Gait belt Activity Tolerance: Patient tolerated treatment well Patient left: in chair;with call bell/phone within reach;with family/visitor present Nurse Communication: Mobility status PT Visit Diagnosis: Muscle weakness (generalized) (M62.81);Difficulty in walking, not elsewhere classified (R26.2)    Time: 8987-8957 PT Time Calculation (min) (ACUTE ONLY): 30 min   Charges:   PT Evaluation $PT Eval Low Complexity: 1 Low PT Treatments $Gait Training: 8-22 mins $Therapeutic Exercise: 8-22 mins PT General Charges $$ ACUTE PT VISIT: 1 Visit         Carmin JONELLE Deed, DPT 10/21/2024, 12:37 PM

## 2024-10-21 NOTE — Plan of Care (Signed)
" °  Problem: Education: Goal: Knowledge of the prescribed therapeutic regimen will improve Outcome: Progressing   Problem: Bowel/Gastric: Goal: Gastrointestinal status for postoperative course will improve Outcome: Progressing   Problem: Cardiac: Goal: Ability to maintain an adequate cardiac output Outcome: Progressing   Problem: Nutritional: Goal: Will attain and maintain optimal nutritional status Outcome: Progressing   Problem: Neurological: Goal: Will regain or maintain usual level of consciousness Outcome: Progressing   Problem: Clinical Measurements: Goal: Ability to maintain clinical measurements within normal limits Outcome: Progressing   "

## 2024-10-21 NOTE — Anesthesia Postprocedure Evaluation (Signed)
"   Anesthesia Post Note  Patient: Bethany Lara  Procedure(s) Performed: Right L5-S1 Hemilaminectomy and medial facetectomy for Synovial Cyst Resection (Right: Spine Lumbar)  Patient location during evaluation: PACU Anesthesia Type: General Level of consciousness: awake and alert Pain management: pain level controlled Vital Signs Assessment: post-procedure vital signs reviewed and stable Respiratory status: spontaneous breathing, nonlabored ventilation, respiratory function stable and patient connected to nasal cannula oxygen Cardiovascular status: blood pressure returned to baseline and stable Postop Assessment: no apparent nausea or vomiting Anesthetic complications: no   No notable events documented.   Last Vitals:  Vitals:   10/20/24 2336 10/21/24 0519  BP: (!) 148/70 (!) 120/55  Pulse: 75 (!) 53  Resp: 16 16  Temp:  (!) 36.2 C  SpO2: 94% 94%    Last Pain:  Vitals:   10/21/24 0519  TempSrc: Temporal  PainSc:                  Bethany Lara      "

## 2024-10-21 NOTE — Progress Notes (Signed)
 DISCHARGE NOTE:   Pt dc with IV removed and dc instructions given. Pt received medications delivered to hospital room. Pt also received 3 in 1 and RW. Pt voices no questions or concerns at this time. Pt wheeled down to medical mall entrance by staff. Pt's husband provide transportation.

## 2024-10-21 NOTE — Care Management Obs Status (Signed)
 MEDICARE OBSERVATION STATUS NOTIFICATION   Patient Details  Name: Bethany Lara MRN: [id number] Date of Birth: November 07, 1950   Medicare Observation Status Notification Given:  Yes    Joell Usman W, CMA 10/21/2024, 11:10 AM

## 2024-10-21 NOTE — Discharge Summary (Signed)
 Discharge Summary  Patient ID: Bethany Lara MRN: [id number] DOB/AGE: 1951-01-15 74 y.o.  Admit date: 10/20/2024 Discharge date: 10/21/2024  Admission Diagnoses: Lumbosacral synovial cyst  Lumbar radiculopathy Discharge Diagnoses:  Principal Problem:   S/P lumbar laminectomy Active Problems:   Synovial cyst of lumbar facet joint   Lumbosacral radiculopathy   Discharged Condition: good  Hospital Course:  Bethany Lara is a 74 y.o presenting with lumbar radiculopathy due to a synovial cyst s/p right L5-S1 laminectomy and medial facetectomy. Her intraoperative course was uncomplicated. She was admitted overnight for therapy evaluation and pain control. Her radicular pain resolved post-op and her incisional pain was controlled by oral pain medication. She was seen and evaluated by therapy and deemed appropriate for discharge home on POD1.   Consults: None  Significant Diagnostic Studies: NA  Treatments: surgery: as above. Please see separately dictated operative report for further details   Discharge Exam: Blood pressure (!) 125/57, pulse (!) 58, temperature 98.3 F (36.8 C), temperature source Oral, resp. rate 16, height 5' (1.524 m), weight 83 kg, SpO2 97%. AA Ox3 CNI   Strength:  Side Iliopsoas Quads Hamstring PF DF EHL  R 5 5 5 5 5  4+  L 5 5 5 5 5 5     Incision: Incision is clean and dry with dressing in place.   Disposition: Discharge disposition: 01-Home or Self Care       Discharge Instructions     Incentive spirometry RT   Complete by: As directed    Remove dressing in 24 hours   Complete by: As directed       Allergies as of 10/21/2024       Reactions   Crestor  [rosuvastatin  Calcium ] Other (See Comments)   myalgias   Zocor [simvastatin] Other (See Comments)   Fatigue, legs achey        Medication List     PAUSE taking these medications    clopidogrel  75 MG tablet Wait to take this until your doctor or other care provider tells you  to start again. Commonly known as: PLAVIX  Take 1 tablet (75 mg total) by mouth daily.       STOP taking these medications    fenofibrate  160 MG tablet   oxyCODONE  5 MG immediate release tablet Commonly known as: Roxicodone    pregabalin  25 MG capsule Commonly known as: Lyrica        TAKE these medications    acetaminophen  500 MG tablet Commonly known as: TYLENOL  Take 2 tablets (1,000 mg total) by mouth every 6 (six) hours as needed.   cetirizine  10 MG tablet Commonly known as: ZYRTEC  Take 1 tablet (10 mg total) by mouth daily. What changed:  when to take this reasons to take this   chlorhexidine  4 % external liquid Commonly known as: HIBICLENS  Apply 15 mLs (1 Application total) topically as directed for 30 doses. Use as directed daily for 5 days every other week for 6 weeks.   diclofenac  Sodium 1 % Gel Commonly known as: Voltaren  Apply 2 g topically 4 (four) times daily.   furosemide  40 MG tablet Commonly known as: LASIX  Take 1 tablet (40 mg total) by mouth daily.   MAGNESIUM  PO Take 1 tablet by mouth daily.   methylPREDNISolone  4 MG Tbpk tablet Commonly known as: MEDROL  DOSEPAK Take as directed on box   multivitamin with minerals Tabs tablet Take 1 tablet by mouth daily.   mupirocin  ointment 2 % Commonly known as: BACTROBAN  Place 1 Application into the nose 2 (two)  times daily for 60 doses. Use as directed 2 times daily for 5 days every other week for 6 weeks.   NON FORMULARY CPAP @@ bedtime.   OMEGA 3 FISH OIL PO Take 1 capsule by mouth daily.   polyethylene glycol powder 17 GM/SCOOP powder Commonly known as: GLYCOLAX /MIRALAX  Dissolve 1 capful (17g) in 4-8 ounces of liquid and take by mouth daily as needed for moderate constipation for 7 days.   senna 8.6 MG Tabs tablet Commonly known as: SENOKOT Take 1 tablet (8.6 mg total) by mouth 2 (two) times daily as needed for mild constipation.   tiZANidine  4 MG tablet Commonly known as: Zanaflex  Take  1 tablet (4 mg total) by mouth every 6 (six) hours as needed for muscle spasms.   VITAMIN K2-VITAMIN D3 PO Take 1 tablet by mouth daily.               Durable Medical Equipment  (From admission, onward)           Start     Ordered   10/21/24 0942  For home use only DME Bedside commode  Once       Question:  Patient needs a bedside commode to treat with the following condition  Answer:  S/P lumbar laminectomy   10/21/24 0941             Signed: Edsel Jama Goods 10/21/2024, 11:42 AM

## 2024-10-21 NOTE — Progress Notes (Addendum)
" ° °  Neurosurgery Progress Note  History: Bethany Lara is here for right L5-S1 laminectomy, medial facetectomy, and synovial cyst resection  POD1: Patient states that overnight her back pain was a 3/10 and well controlled by her medication. She has no headache or leg pain/weakness. She ambulated to the bathroom this morning with no concerns.  Physical Exam: Vitals:   10/20/24 2336 10/21/24 0519  BP: (!) 148/70 (!) 120/55  Pulse: 75 (!) 53  Resp: 16 16  Temp:  (!) 97.2 F (36.2 C)  SpO2: 94% 94%    AA Ox3 CNI  Strength:  Side Iliopsoas Quads Hamstring PF DF EHL  R 5 5 5 5 5  4+  L 5 5 5 5 5 5    Incision: Incision is clean and dry with dressing in place.   Assessment/Plan:  Bethany Lara is here for right L5-S1 laminectomy, medial facetectomy, and synovial cyst resection. She initially presented with back and right leg pain with worsening right foot weakness.  - mobilize - pain control - DVT prophylaxis - PTOT; dispo planning underway  Edsel Goods PA-C (in conjunction with Tobey Libel PA-S) Department of Neurosurgery    "

## 2024-10-21 NOTE — TOC Progression Note (Signed)
 Transition of Care [facility]) - Progression Note    Patient Details  Name: Bethany Lara MRN: [id number] Date of Birth: [birth year]  [facility] CM/SW Contact  Rylah Fukuda L Allaya Abbasi, [place] Phone Number: 10/21/2024, 11:36 AM  Clinical Narrative:        [employer] [facility] notified of DME need. [employer] will process order with patient and DME will be delivered today.                  Expected Discharge Plan and Services                                               Social Drivers of Health (SDOH) Interventions SDOH Screenings   Food Insecurity: No Food Insecurity (10/20/2024)  Housing: Low Risk (10/20/2024)  Transportation Needs: No Transportation Needs (10/20/2024)  Utilities: Not At Risk (10/20/2024)  Depression (PHQ2-9): Low Risk (10/14/2024)  Recent Concern: Depression (PHQ2-9) - High Risk (09/01/2024)  Financial Resource Strain: Low Risk  (03/23/2024)   Received from [facility]  Social Connections: Socially Integrated (10/20/2024)  Tobacco Use: Low Risk (10/20/2024)  Health Literacy: Adequate Health Literacy (02/13/2024)    Readmission Risk Interventions     No data to display

## 2024-10-23 ENCOUNTER — Telehealth: Payer: Self-pay | Admitting: Neurosurgery

## 2024-10-23 NOTE — Telephone Encounter (Signed)
 [name] with [name] is calling to request verbal PT orders of 1x1, 2x3, and 1x5. Verbal orders given by me as okay per [name].

## 2024-10-29 LAB — SURGICAL PATHOLOGY

## 2024-11-03 ENCOUNTER — Encounter: Admitting: Physician Assistant

## 2024-11-05 ENCOUNTER — Encounter: Admitting: Physician Assistant

## 2024-11-30 ENCOUNTER — Encounter

## 2025-01-06 ENCOUNTER — Encounter: Admitting: Physician Assistant

## 2025-03-02 ENCOUNTER — Encounter: Admitting: Nurse Practitioner

## 2025-03-09 ENCOUNTER — Ambulatory Visit
